# Patient Record
Sex: Female | Born: 1970 | Race: Black or African American | Hispanic: No | Smoking: Never smoker
Health system: Southern US, Community
[De-identification: ages and names within clinical notes are randomized; demographics above are authoritative.]

## PROBLEM LIST (undated history)

## (undated) DIAGNOSIS — K589 Irritable bowel syndrome without diarrhea: Secondary | ICD-10-CM

## (undated) DIAGNOSIS — J40 Bronchitis, not specified as acute or chronic: Secondary | ICD-10-CM

## (undated) DIAGNOSIS — A630 Anogenital (venereal) warts: Secondary | ICD-10-CM

## (undated) DIAGNOSIS — K219 Gastro-esophageal reflux disease without esophagitis: Secondary | ICD-10-CM

## (undated) DIAGNOSIS — Z9289 Personal history of other medical treatment: Secondary | ICD-10-CM

## (undated) DIAGNOSIS — F32A Depression, unspecified: Secondary | ICD-10-CM

## (undated) DIAGNOSIS — F329 Major depressive disorder, single episode, unspecified: Secondary | ICD-10-CM

## (undated) DIAGNOSIS — D3610 Benign neoplasm of peripheral nerves and autonomic nervous system, unspecified: Secondary | ICD-10-CM

## (undated) DIAGNOSIS — T7840XA Allergy, unspecified, initial encounter: Secondary | ICD-10-CM

## (undated) DIAGNOSIS — D649 Anemia, unspecified: Secondary | ICD-10-CM

## (undated) DIAGNOSIS — E785 Hyperlipidemia, unspecified: Secondary | ICD-10-CM

## (undated) DIAGNOSIS — K8020 Calculus of gallbladder without cholecystitis without obstruction: Secondary | ICD-10-CM

## (undated) DIAGNOSIS — F419 Anxiety disorder, unspecified: Secondary | ICD-10-CM

## (undated) DIAGNOSIS — L709 Acne, unspecified: Secondary | ICD-10-CM

## (undated) DIAGNOSIS — R7303 Prediabetes: Secondary | ICD-10-CM

## (undated) DIAGNOSIS — R7302 Impaired glucose tolerance (oral): Secondary | ICD-10-CM

## (undated) DIAGNOSIS — J45909 Unspecified asthma, uncomplicated: Secondary | ICD-10-CM

## (undated) DIAGNOSIS — Z8632 Personal history of gestational diabetes: Secondary | ICD-10-CM

## (undated) DIAGNOSIS — N200 Calculus of kidney: Secondary | ICD-10-CM

## (undated) DIAGNOSIS — G5762 Lesion of plantar nerve, left lower limb: Secondary | ICD-10-CM

## (undated) DIAGNOSIS — S92342A Displaced fracture of fourth metatarsal bone, left foot, initial encounter for closed fracture: Secondary | ICD-10-CM

## (undated) DIAGNOSIS — R87619 Unspecified abnormal cytological findings in specimens from cervix uteri: Secondary | ICD-10-CM

## (undated) HISTORY — DX: Unspecified abnormal cytological findings in specimens from cervix uteri: R87.619

## (undated) HISTORY — DX: Allergy, unspecified, initial encounter: T78.40XA

## (undated) HISTORY — PX: GYNECOLOGIC CRYOSURGERY: SHX857

## (undated) HISTORY — DX: Irritable bowel syndrome, unspecified: K58.9

## (undated) HISTORY — PX: OTHER SURGICAL HISTORY: SHX169

## (undated) HISTORY — DX: Personal history of other medical treatment: Z92.89

## (undated) HISTORY — DX: Lesion of plantar nerve, left lower limb: G57.62

## (undated) HISTORY — DX: Anemia, unspecified: D64.9

## (undated) HISTORY — DX: Calculus of kidney: N20.0

## (undated) HISTORY — DX: Benign neoplasm of peripheral nerves and autonomic nervous system, unspecified: D36.10

## (undated) HISTORY — DX: Prediabetes: R73.03

## (undated) HISTORY — DX: Personal history of gestational diabetes: Z86.32

## (undated) HISTORY — PX: TUBAL LIGATION: SHX77

## (undated) HISTORY — DX: Gastro-esophageal reflux disease without esophagitis: K21.9

## (undated) HISTORY — PX: ENDOMETRIAL ABLATION: SHX621

## (undated) HISTORY — DX: Displaced fracture of fourth metatarsal bone, left foot, initial encounter for closed fracture: S92.342A

## (undated) HISTORY — DX: Impaired glucose tolerance (oral): R73.02

## (undated) HISTORY — DX: Calculus of gallbladder without cholecystitis without obstruction: K80.20

---

## 1998-02-18 ENCOUNTER — Other Ambulatory Visit: Admission: RE | Admit: 1998-02-18 | Discharge: 1998-02-18 | Payer: Self-pay | Admitting: *Deleted

## 1998-08-15 ENCOUNTER — Inpatient Hospital Stay (HOSPITAL_COMMUNITY): Admission: AD | Admit: 1998-08-15 | Discharge: 1998-08-15 | Payer: Self-pay | Admitting: Obstetrics and Gynecology

## 1998-09-06 ENCOUNTER — Inpatient Hospital Stay (HOSPITAL_COMMUNITY): Admission: AD | Admit: 1998-09-06 | Discharge: 1998-09-09 | Payer: Self-pay | Admitting: *Deleted

## 1999-05-16 ENCOUNTER — Ambulatory Visit (HOSPITAL_COMMUNITY): Admission: RE | Admit: 1999-05-16 | Discharge: 1999-05-16 | Payer: Self-pay | Admitting: *Deleted

## 1999-05-20 ENCOUNTER — Ambulatory Visit (HOSPITAL_COMMUNITY): Admission: RE | Admit: 1999-05-20 | Discharge: 1999-05-20 | Payer: Self-pay | Admitting: *Deleted

## 1999-07-10 ENCOUNTER — Emergency Department (HOSPITAL_COMMUNITY): Admission: EM | Admit: 1999-07-10 | Discharge: 1999-07-10 | Payer: Self-pay | Admitting: Emergency Medicine

## 1999-07-11 ENCOUNTER — Encounter: Payer: Self-pay | Admitting: Emergency Medicine

## 2001-05-08 ENCOUNTER — Emergency Department (HOSPITAL_COMMUNITY): Admission: EM | Admit: 2001-05-08 | Discharge: 2001-05-08 | Payer: Self-pay

## 2001-11-12 ENCOUNTER — Ambulatory Visit (HOSPITAL_COMMUNITY): Admission: RE | Admit: 2001-11-12 | Discharge: 2001-11-12 | Payer: Self-pay | Admitting: Sports Medicine

## 2001-11-12 ENCOUNTER — Encounter: Payer: Self-pay | Admitting: Sports Medicine

## 2002-02-06 ENCOUNTER — Encounter: Admission: RE | Admit: 2002-02-06 | Discharge: 2002-04-11 | Payer: Self-pay | Admitting: Sports Medicine

## 2002-02-06 ENCOUNTER — Other Ambulatory Visit: Admission: RE | Admit: 2002-02-06 | Discharge: 2002-02-06 | Payer: Self-pay | Admitting: Internal Medicine

## 2003-03-15 ENCOUNTER — Emergency Department (HOSPITAL_COMMUNITY): Admission: AD | Admit: 2003-03-15 | Discharge: 2003-03-15 | Payer: Self-pay | Admitting: Family Medicine

## 2004-05-12 ENCOUNTER — Ambulatory Visit: Payer: Self-pay | Admitting: Family Medicine

## 2010-02-22 ENCOUNTER — Other Ambulatory Visit: Admission: RE | Admit: 2010-02-22 | Discharge: 2010-02-22 | Payer: Self-pay | Admitting: Family Medicine

## 2010-06-18 ENCOUNTER — Inpatient Hospital Stay (HOSPITAL_COMMUNITY): Payer: BC Managed Care – PPO

## 2010-06-18 ENCOUNTER — Inpatient Hospital Stay (HOSPITAL_COMMUNITY)
Admission: AD | Admit: 2010-06-18 | Discharge: 2010-06-20 | DRG: 361 | Disposition: A | Discharge status: Home / Self Care | Payer: BC Managed Care – PPO | Source: Ambulatory Visit | Attending: Obstetrics and Gynecology | Admitting: Obstetrics and Gynecology

## 2010-06-18 DIAGNOSIS — N9989 Other postprocedural complications and disorders of genitourinary system: Secondary | ICD-10-CM | POA: Diagnosis present

## 2010-06-18 DIAGNOSIS — K589 Irritable bowel syndrome without diarrhea: Secondary | ICD-10-CM | POA: Diagnosis present

## 2010-06-18 DIAGNOSIS — N719 Inflammatory disease of uterus, unspecified: Secondary | ICD-10-CM | POA: Diagnosis present

## 2010-06-18 DIAGNOSIS — N949 Unspecified condition associated with female genital organs and menstrual cycle: Secondary | ICD-10-CM | POA: Diagnosis present

## 2010-06-18 DIAGNOSIS — N390 Urinary tract infection, site not specified: Secondary | ICD-10-CM | POA: Diagnosis present

## 2010-06-18 DIAGNOSIS — R109 Unspecified abdominal pain: Secondary | ICD-10-CM | POA: Diagnosis present

## 2010-06-18 DIAGNOSIS — S3760XA Unspecified injury of uterus, initial encounter: Principal | ICD-10-CM | POA: Diagnosis present

## 2010-06-18 DIAGNOSIS — Y838 Other surgical procedures as the cause of abnormal reaction of the patient, or of later complication, without mention of misadventure at the time of the procedure: Secondary | ICD-10-CM | POA: Diagnosis present

## 2010-06-18 DIAGNOSIS — IMO0002 Reserved for concepts with insufficient information to code with codable children: Secondary | ICD-10-CM | POA: Diagnosis present

## 2010-06-18 LAB — CBC
HCT: 40.1 % (ref 36.0–46.0)
Hemoglobin: 12.7 g/dL (ref 12.0–15.0)
MCH: 26.4 pg (ref 26.0–34.0)
MCHC: 31.7 g/dL (ref 30.0–36.0)
MCV: 83.4 fL (ref 78.0–100.0)
Platelets: 335 10*3/uL (ref 150–400)
RBC: 4.81 MIL/uL (ref 3.87–5.11)
RDW: 14.3 % (ref 11.5–15.5)
WBC: 11.3 10*3/uL — ABNORMAL HIGH (ref 4.0–10.5)

## 2010-06-18 LAB — URINE MICROSCOPIC-ADD ON

## 2010-06-18 LAB — COMPREHENSIVE METABOLIC PANEL
ALT: 20 U/L (ref 0–35)
AST: 20 U/L (ref 0–37)
Albumin: 4.1 g/dL (ref 3.5–5.2)
Alkaline Phosphatase: 53 U/L (ref 39–117)
Chloride: 98 mEq/L (ref 96–112)
GFR calc Af Amer: >60 mL/min (ref >60)
Potassium: 3.7 mEq/L (ref 3.5–5.1)
Sodium: 137 mEq/L (ref 135–145)
Total Bilirubin: 0.7 mg/dL (ref 0.3–1.2)

## 2010-06-18 LAB — URINALYSIS, ROUTINE W REFLEX MICROSCOPIC
Bilirubin Urine: NEGATIVE
Glucose, UA: NEGATIVE mg/dL
Ketones, ur: NEGATIVE mg/dL
Protein, ur: NEGATIVE mg/dL

## 2010-06-18 MED ORDER — IOHEXOL 300 MG/ML  SOLN
100.0000 mL | Freq: Once | INTRAMUSCULAR | Status: AC | PRN
  Administered 2010-06-18: 100 mL via INTRAVENOUS

## 2010-06-19 ENCOUNTER — Other Ambulatory Visit: Payer: Self-pay | Admitting: Obstetrics and Gynecology

## 2010-06-19 LAB — URINE CULTURE
Colony Count: 30000
Culture  Setup Time: 201203171746

## 2010-06-19 LAB — COMPREHENSIVE METABOLIC PANEL
AST: 16 U/L (ref 0–37)
Albumin: 3.2 g/dL — ABNORMAL LOW (ref 3.5–5.2)
BUN: 6 mg/dL (ref 6–23)
Calcium: 8.7 mg/dL (ref 8.4–10.5)
Creatinine, Ser: 0.72 mg/dL (ref 0.4–1.2)
GFR calc Af Amer: >60 mL/min (ref >60)
GFR calc non Af Amer: >60 mL/min (ref >60)

## 2010-06-19 LAB — CBC
MCH: 25.8 pg — ABNORMAL LOW (ref 26.0–34.0)
MCHC: 31.3 g/dL (ref 30.0–36.0)
MCV: 82.6 fL (ref 78.0–100.0)
Platelets: 246 10*3/uL (ref 150–400)

## 2010-06-20 LAB — CBC
HCT: 34.9 % — ABNORMAL LOW (ref 36.0–46.0)
MCHC: 30.9 g/dL (ref 30.0–36.0)
MCV: 82.5 fL (ref 78.0–100.0)
Platelets: 330 10*3/uL (ref 150–400)
RDW: 14.4 % (ref 11.5–15.5)
WBC: 12.1 10*3/uL — ABNORMAL HIGH (ref 4.0–10.5)

## 2010-06-20 NOTE — Op Note (Signed)
NAMEMarland Kitchen  Alexis [name], Alexis [name]       ACCOUNT NO.:  [id number]  MEDICAL RECORD NO.:  [id number]           PATIENT TYPE:  I  LOCATION:  9317                          FACILITY:  [facility]  PHYSICIAN:  Angelia Mould. Derrell Lolling, M.D.DATE OF BIRTH:  10-28-1970  DATE OF PROCEDURE:  06/19/2010 DATE OF DISCHARGE:                              OPERATIVE REPORT   PREOPERATIVE DIAGNOSES: 1. Abdominal pain. 2. Status post recent endometrial ablation.  POSTOPERATIVE DIAGNOSES: 1. Uterine inflammation. 2. Acute small bowel adhesions, nonobstructive, nonperforated.  OPERATIONS PERFORMED: 1. Intraoperative general surgical consultation. 2. Lysis of adhesions.  SURGEON:  Angelia Mould. Derrell Lolling, MD  FIRST ASSISTANT:  Patsy Baltimore, MD  OPERATIVE INDICATIONS:  This is a 40 year old Afro American female who underwent outpatient endometrial ablation with some type of energy device about 9 or 10 days ago.  She has had some lower abdominal and right lower quadrant pain for about 5 days.  She has not had any fever, chills, nausea, or vomiting.  She has been a little bit constipated. She was admitted by Dr. Gevena Cotton.  CBC is normal.  CT scan shows thick walled, slightly inflamed uterus.  The small bowel and colon do not look diseased.  There is no abscess or free fluid or obstruction noted. Because of the recent procedure and onset of pain, it was felt wise to proceed with diagnostic laparoscopy as a diagnostic procedure to make sure that has not been a bowel injury.  In terms of the consultation, I did meet the patient in the holding area of the operating room.  I did take a history from her and examined her and found that she had mild tenderness throughout the lower abdomen perhaps more in the right lower quadrant than the left, but had hypoactive bowel sounds.  She did not seem to be particularly distended and did not have any dramatic peritoneal signs.  There was no abdominal mass.  INTRAOPERATIVE FINDINGS:  The  uterus was adherent to the anterior abdominal wall in the lower midline.  The uterus was somewhat thick- walled, but there was no exudate or signs of any abscess or inflammation.  The tubes and ovaries looked normal.  There was a loop of small bowel that was adherent to the posterior fundus of the uterus near the junction of the uterus and fallopian tube.  This was an acute adhesion and with teasing, I was able to just take this down.  On examining the small bowel, sigmoid colon, rectum, liver, right colon, I found no evidence of any primary inflammatory process.  There was no purulence or exudate and there was no evidence of any bowel injury or enteric content.  I felt that this was a negative diagnostic laparoscopy in terms of bowel injury.  There was also some omental adhesions in the lower midline presumably due to her previous tubal ligation.  These were chronic.  We were able to work around these just fine.  Again, the right fallopian tube and ovary looked normal.  The left fallopian tube and ovary looked normal.  No significant finding, was the adherence of the uterus to the lower midline, which appeared to be chronic.  OPERATIVE TECHNIQUE:  Following the induction of general endotracheal anesthesia, the patient's abdomen was prepped and draped in sterile fashion.  Surgical time-out was held identifying correct patient and correct procedure.  We placed an 11-mm trocar at the umbilicus. Insufflation was performed with visualization and findings as described above.  We placed a 5-mm trocar in the right midabdomen and a 5-mm trocar in the left midabdomen and used these to move the camera around and move the instruments around.  I took over the conduct of the case.  I found that the omental adhesions to the midline just below the umbilicus were very dense and did not involve any bowel, so we left this in place. I found one loop of small bowel adherent to the posterior wall of  the uterus, which appeared to be acute and with just gentle teasing, this came down without any evidence of any injury not even a serosal injury. I then ran the small bowel in the lower abdomen and looked at the sigmoid colon and rectum.  There was some clear fluid in the pelvis, which was evacuated.  At this point in the case, I felt there was no further indication for any general surgical procedure.  Dr. Gevena Cotton felt that she would probably simply close at this point.  Estimated blood loss at this point was about 10 mL.  Complications none. Sponge, needle and instrument counts were correct.     Angelia Mould. Derrell Lolling, M.D.     HMI/MEDQ  D:  06/19/2010  T:  06/20/2010  Job:  [id number]  Electronically Signed by Claud Kelp M.D. on 06/20/2010 09:39:12 AM

## 2010-06-24 LAB — STOOL CULTURE

## 2010-06-30 NOTE — Op Note (Signed)
NAMEMarland [name]  ANGELEAH, LABRAKE       ACCOUNT NO.:  [id number]  MEDICAL RECORD NO.:  [id number]           PATIENT TYPE:  I  LOCATION:  9317                          FACILITY:  [facility]  PHYSICIAN:  Patsy Baltimore, MD     DATE OF BIRTH:  10/26/1970  DATE OF PROCEDURE:  06/19/2010 DATE OF DISCHARGE:                              OPERATIVE REPORT   PREOPERATIVE DIAGNOSIS:  Suspected uterine perforation.  POSTOPERATIVE DIAGNOSES:  Adhesions and suspected uterine perforation.  PROCEDURES PERFORMED: 1. Diagnostic laparoscopy. 2. General surgery intraoperative consult with lysis of adhesion. 3. Hysteroscopy. 4. Dilation and curettage.  SURGEON:  Patsy Baltimore, MD  ASSISTANT:  Angelia Mould. Derrell Lolling, MD from general surgery.  ANESTHESIA:  General.  FINDINGS: 1. A loop of small bowel adherent to the posterior uterine fundus. 2. Chronic adhesions from previous patient's surgeries, omentum to the     anterior abdominal wall and the uterus to the anterior abdominal     wall.  SPECIMENS SENT:  Endometrial curettings.  ESTIMATED BLOOD LOSS:  Minimal.  IV FLUIDS GIVEN:  1800 mL.  URINE OUTPUT:  350 mL of clear yellow urine.  FLUID DEFICIT FOR HYSTEROSCOPY:  125 mL.  The patient was taken to the operating room with IV fluids running. Informed consent had been obtained for diagnostic laparoscopy.  She was put under general anesthesia.  Abdomen and perineum were prepped and draped in the usual sterile fashion.  She had already been receiving Zosyn and Flagyl and therefore there were no additional antibiotics given around the time of surgery.  She had sequential compression devices for DVT prophylaxis.  A time-out was called and we began the procedure.  An incision was made in the umbilicus enough to accommodate a 10-mm scope.  The trocar was then inserted without difficulty.  Entry into the intra-abdominal cavity was confirmed by low pressure upon initial insufflation of gas with  visualization of the intra-abdominal organs.  High-flow of CO2 was then initiated.  Inspection of the abdomen revealed omental adhesions to the anterior abdominal wall in the umbilical area and also in the area of the right lower quadrant.  Two additional 5-mm ports were placed on the right and left sides of the mid abdomen.  Inspection of the pelvis revealed an adhesion of loop of small bowel adherent to the posterior aspect of the uterine fundus.  The uterus was also adherent to the anterior/pelvic sidewall.  The piece of small bowel adherent to the uterus was easily dislodged.  However, it became obvious that the other adhesions were chronic.  The patient had a history of two previous cesarean sections plus an interval bilateral tubal ligation hence the chronic adhesions that were noted in her abdomen.  However, the loop of small bowel that was adherent to the uterus was deemed to be acute based on the way it was easily detached.  The loop of bowel was inspected by Dr. Derrell Lolling.  There was no evidence of ischemia.  It looked well vascularized and other areas of the bowel were inspected as well.  There was no evidence of bowel injury.  There was a little bit of fluid in the cul-de-sac.  This fluid  was suctioned.  It was clear with no evidence of infection.  The uterine serosa appeared normal.  At this point, the diagnostic laparoscopy was concluded.  The laparoscopic instruments were removed from the abdomen under direct visualization.  The 5-mm lateral incisions were closed with Dermabond.  The 10-mm incision was closed with a figure-of-eight suture putting the fascia together and the skin was closed with 4-0 Vicryl as well as Dermabond.  Attention was then turned to the vagina.  A speculum was inserted.  The anterior lip of the cervix was grasped with a single-toothed tenaculum.  The uterus was already dilated enough to accommodate the hysteroscope.  Hysteroscopy revealed a rather fluffy  endometrium.  It was difficult to visualize the cornua.  There were postop changes from the recent endometrial ablation.  However, there was no evidence of purulent material or infected tissue.  A blunt curette was used to scrape just a little bit of the endometrium and sent to Pathology.  Once this was done, all instruments were removed from the vagina.  The tenaculum puncture sites were hemostatic.  The patient was cleaned, reawakened and taken to the PACU in stable condition.  The sponge, needle and instrument counts were correct x2.          ______________________________ Patsy Baltimore, MD     CO/MEDQ  D:  06/19/2010  T:  06/20/2010  Job:  [id number]  Electronically Signed by Patsy Baltimore MD on 06/30/2010 12:40:22 PM

## 2010-07-15 ENCOUNTER — Emergency Department (HOSPITAL_COMMUNITY): Payer: Worker's Compensation

## 2010-07-15 ENCOUNTER — Emergency Department (HOSPITAL_COMMUNITY)
Admission: EM | Admit: 2010-07-15 | Discharge: 2010-07-15 | Disposition: A | Discharge status: Home / Self Care | Payer: Worker's Compensation | Attending: Emergency Medicine | Admitting: Emergency Medicine

## 2010-07-15 DIAGNOSIS — R109 Unspecified abdominal pain: Secondary | ICD-10-CM | POA: Insufficient documentation

## 2010-07-15 DIAGNOSIS — N949 Unspecified condition associated with female genital organs and menstrual cycle: Secondary | ICD-10-CM | POA: Insufficient documentation

## 2010-07-15 DIAGNOSIS — F3289 Other specified depressive episodes: Secondary | ICD-10-CM | POA: Insufficient documentation

## 2010-07-15 DIAGNOSIS — N390 Urinary tract infection, site not specified: Secondary | ICD-10-CM | POA: Insufficient documentation

## 2010-07-15 DIAGNOSIS — E7800 Pure hypercholesterolemia, unspecified: Secondary | ICD-10-CM | POA: Insufficient documentation

## 2010-07-15 DIAGNOSIS — F329 Major depressive disorder, single episode, unspecified: Secondary | ICD-10-CM | POA: Insufficient documentation

## 2010-07-15 DIAGNOSIS — N938 Other specified abnormal uterine and vaginal bleeding: Secondary | ICD-10-CM | POA: Insufficient documentation

## 2010-07-15 LAB — DIFFERENTIAL
Basophils Relative: 0 % (ref 0–1)
Lymphs Abs: 1.4 10*3/uL (ref 0.7–4.0)
Monocytes Absolute: 0.4 10*3/uL (ref 0.1–1.0)
Monocytes Relative: 7 % (ref 3–12)
Neutro Abs: 3.7 10*3/uL (ref 1.7–7.7)
Neutrophils Relative %: 66 % (ref 43–77)

## 2010-07-15 LAB — CBC
HCT: 37.2 % (ref 36.0–46.0)
Hemoglobin: 11.7 g/dL — ABNORMAL LOW (ref 12.0–15.0)
MCH: 25.9 pg — ABNORMAL LOW (ref 26.0–34.0)
MCHC: 31.5 g/dL (ref 30.0–36.0)
MCV: 82.5 fL (ref 78.0–100.0)
RBC: 4.51 MIL/uL (ref 3.87–5.11)

## 2010-07-15 LAB — BASIC METABOLIC PANEL
BUN: 5 mg/dL — ABNORMAL LOW (ref 6–23)
CO2: 30 mEq/L (ref 19–32)
Calcium: 9.3 mg/dL (ref 8.4–10.5)
Chloride: 104 mEq/L (ref 96–112)
Creatinine, Ser: 0.65 mg/dL (ref 0.4–1.2)
GFR calc Af Amer: >60 mL/min (ref >60)
Glucose, Bld: 92 mg/dL (ref 70–99)

## 2010-07-15 LAB — URINE MICROSCOPIC-ADD ON

## 2010-07-15 LAB — URINALYSIS, ROUTINE W REFLEX MICROSCOPIC
Glucose, UA: NEGATIVE mg/dL
Ketones, ur: NEGATIVE mg/dL
Protein, ur: NEGATIVE mg/dL
pH: 6 (ref 5.0–8.0)

## 2010-07-15 LAB — WET PREP, GENITAL: Trich, Wet Prep: NONE SEEN

## 2010-07-16 LAB — GC/CHLAMYDIA PROBE AMP, GENITAL: Chlamydia, DNA Probe: NEGATIVE

## 2010-07-17 ENCOUNTER — Emergency Department (HOSPITAL_COMMUNITY)
Admission: EM | Admit: 2010-07-17 | Discharge: 2010-07-17 | Disposition: A | Discharge status: Home / Self Care | Payer: Worker's Compensation | Attending: Emergency Medicine | Admitting: Emergency Medicine

## 2010-07-17 DIAGNOSIS — K589 Irritable bowel syndrome without diarrhea: Secondary | ICD-10-CM | POA: Insufficient documentation

## 2010-07-17 DIAGNOSIS — W010XXA Fall on same level from slipping, tripping and stumbling without subsequent striking against object, initial encounter: Secondary | ICD-10-CM | POA: Insufficient documentation

## 2010-07-17 DIAGNOSIS — F3289 Other specified depressive episodes: Secondary | ICD-10-CM | POA: Insufficient documentation

## 2010-07-17 DIAGNOSIS — M25569 Pain in unspecified knee: Secondary | ICD-10-CM | POA: Insufficient documentation

## 2010-07-17 DIAGNOSIS — Z79899 Other long term (current) drug therapy: Secondary | ICD-10-CM | POA: Insufficient documentation

## 2010-07-17 DIAGNOSIS — F329 Major depressive disorder, single episode, unspecified: Secondary | ICD-10-CM | POA: Insufficient documentation

## 2011-03-01 ENCOUNTER — Other Ambulatory Visit: Payer: Self-pay | Admitting: Family Medicine

## 2011-03-01 ENCOUNTER — Other Ambulatory Visit (HOSPITAL_COMMUNITY)
Admission: RE | Admit: 2011-03-01 | Discharge: 2011-03-01 | Disposition: A | Discharge status: Home / Self Care | Payer: BC Managed Care – PPO | Source: Ambulatory Visit | Attending: Family Medicine | Admitting: Family Medicine

## 2011-03-01 DIAGNOSIS — Z01419 Encounter for gynecological examination (general) (routine) without abnormal findings: Secondary | ICD-10-CM | POA: Insufficient documentation

## 2012-02-05 ENCOUNTER — Inpatient Hospital Stay (HOSPITAL_COMMUNITY)
Admission: AD | Admit: 2012-02-05 | Discharge: 2012-02-05 | Disposition: A | Discharge status: Hospice | Payer: Self-pay | Source: Ambulatory Visit | Attending: Obstetrics and Gynecology | Admitting: Obstetrics and Gynecology

## 2012-02-05 ENCOUNTER — Encounter (HOSPITAL_COMMUNITY): Payer: Self-pay

## 2012-02-05 ENCOUNTER — Inpatient Hospital Stay (HOSPITAL_COMMUNITY): Payer: Self-pay

## 2012-02-05 DIAGNOSIS — K5900 Constipation, unspecified: Secondary | ICD-10-CM

## 2012-02-05 DIAGNOSIS — R109 Unspecified abdominal pain: Secondary | ICD-10-CM | POA: Insufficient documentation

## 2012-02-05 DIAGNOSIS — R51 Headache: Secondary | ICD-10-CM | POA: Insufficient documentation

## 2012-02-05 HISTORY — DX: Depression, unspecified: F32.A

## 2012-02-05 HISTORY — DX: Bronchitis, not specified as acute or chronic: J40

## 2012-02-05 HISTORY — DX: Hyperlipidemia, unspecified: E78.5

## 2012-02-05 HISTORY — DX: Anogenital (venereal) warts: A63.0

## 2012-02-05 HISTORY — DX: Anxiety disorder, unspecified: F41.9

## 2012-02-05 HISTORY — DX: Major depressive disorder, single episode, unspecified: F32.9

## 2012-02-05 HISTORY — DX: Unspecified asthma, uncomplicated: J45.909

## 2012-02-05 HISTORY — DX: Acne, unspecified: L70.9

## 2012-02-05 LAB — URINE MICROSCOPIC-ADD ON

## 2012-02-05 LAB — CBC WITH DIFFERENTIAL/PLATELET
Hemoglobin: 12.1 g/dL (ref 12.0–15.0)
Lymphocytes Relative: 27 % (ref 12–46)
Lymphs Abs: 1.8 10*3/uL (ref 0.7–4.0)
Monocytes Relative: 7 % (ref 3–12)
Neutrophils Relative %: 65 % (ref 43–77)
Platelets: 259 10*3/uL (ref 150–400)
RBC: 4.4 MIL/uL (ref 3.87–5.11)
WBC: 6.6 10*3/uL (ref 4.0–10.5)

## 2012-02-05 LAB — URINALYSIS, ROUTINE W REFLEX MICROSCOPIC
Bilirubin Urine: NEGATIVE
Glucose, UA: NEGATIVE mg/dL
Ketones, ur: NEGATIVE mg/dL
Leukocytes, UA: NEGATIVE
Nitrite: NEGATIVE
Protein, ur: NEGATIVE mg/dL
Specific Gravity, Urine: 1.015 (ref 1.005–1.030)
Urobilinogen, UA: 0.2 mg/dL (ref 0.0–1.0)
pH: 6 (ref 5.0–8.0)

## 2012-02-05 LAB — POCT PREGNANCY, URINE: Preg Test, Ur: NEGATIVE

## 2012-02-05 LAB — WET PREP, GENITAL
WBC, Wet Prep HPF POC: NONE SEEN
Yeast Wet Prep HPF POC: NONE SEEN

## 2012-02-05 NOTE — MAU Provider Note (Signed)
History     CSN: [id number]  Arrival date and time: 02/05/12 1312   First Provider Initiated Contact with Patient 02/05/12 1338      Chief Complaint  Patient presents with  . Abdominal Pain   HPI Alexis [name] is 41 y.o. presents with left sided pain that began around 7am.  Patient of [facility].  Describes as constant when she moves, and resolves when lying quietly.  Nausea without vomiting.  Had a headache yesterday, went away and came back today.  Hasn't taken anything for the headache.  Last bowel movement this am--had 3 bowel movements, which were described as hard,  this am--took MOM last night for constipation.  Denies fever, vaginal bleeding or discharge.  LMP 02-01-12--normal cycle for her.  Condoms for contraception.  Had endometrial ablation 3/13 then 1 week later had diagnostic lap "they found adhesions and separated by uterus/bowel.  Hx of GERD.     No past medical history on file.  No past surgical history on file.  No family history on file.  History  Substance Use Topics  . Smoking status: Not on file  . Smokeless tobacco: Not on file  . Alcohol Use: Not on file    Allergies: Allergies not on file  No prescriptions prior to admission    Review of Systems  Constitutional: Negative for fever and chills.  Respiratory: Negative.   Cardiovascular: Negative.   Gastrointestinal: Positive for heartburn (hx of ), nausea, abdominal pain (left sided) and constipation. Negative for vomiting and diarrhea.  Genitourinary: Negative.   Neurological: Positive for headaches.   Physical Exam   Blood pressure 126/74, temperature 98.1 F (36.7 C), temperature source Oral, resp. rate 16, height 5\' 3"  (1.6 m), weight 70.308 kg (155 lb), last menstrual period 02/01/2012, SpO2 100.00%.  Physical Exam  Constitutional: She is oriented to person, place, and time. She appears well-developed and well-nourished. No distress.  HENT:  Head: Normocephalic.  Neck: Normal  range of motion.  Cardiovascular: Normal rate.   Respiratory: Effort normal.  GI: Soft. She exhibits no distension and no mass. There is tenderness (left lower quadrant). There is no rebound and no guarding.  Genitourinary: There is no tenderness or lesion on the right labia. There is no tenderness or lesion on the left labia. Uterus is not enlarged and not tender. Cervix exhibits no motion tenderness, no discharge and no friability. Right adnexum displays no mass, no tenderness and no fullness. Left adnexum displays tenderness. Left adnexum displays no mass and no fullness. No bleeding around the vagina. No vaginal discharge found.  Neurological: She is alert and oriented to person, place, and time.  Skin: Skin is warm and dry.  Psychiatric: She has a normal mood and affect. Her behavior is normal.   Results for orders placed during the hospital encounter of 02/05/12 (from the past 24 hour(s))  URINALYSIS, ROUTINE W REFLEX MICROSCOPIC     Status: Abnormal   Collection Time   02/05/12  1:25 PM      Component Value Range   Color, Urine YELLOW  YELLOW   APPearance CLEAR  CLEAR   Specific Gravity, Urine 1.015  1.005 - 1.030   pH 6.0  5.0 - 8.0   Glucose, UA NEGATIVE  NEGATIVE mg/dL   Hgb urine dipstick MODERATE (*) NEGATIVE   Bilirubin Urine NEGATIVE  NEGATIVE   Ketones, ur NEGATIVE  NEGATIVE mg/dL   Protein, ur NEGATIVE  NEGATIVE mg/dL   Urobilinogen, UA 0.2  0.0 - 1.0  mg/dL   Nitrite NEGATIVE  NEGATIVE   Leukocytes, UA NEGATIVE  NEGATIVE  URINE MICROSCOPIC-ADD ON     Status: Abnormal   Collection Time   02/05/12  1:25 PM      Component Value Range   Squamous Epithelial / LPF FEW (*) RARE   RBC / HPF 3-6  <3 RBC/hpf  POCT PREGNANCY, URINE     Status: Normal   Collection Time   02/05/12  1:37 PM      Component Value Range   Preg Test, Ur NEGATIVE  NEGATIVE  CBC WITH DIFFERENTIAL     Status: Normal   Collection Time   02/05/12  2:05 PM      Component Value Range   WBC 6.6  4.0 - 10.5  K/uL   RBC 4.40  3.87 - 5.11 MIL/uL   Hemoglobin 12.1  12.0 - 15.0 g/dL   HCT 96.0  45.4 - 09.8 %   MCV 83.6  78.0 - 100.0 fL   MCH 27.5  26.0 - 34.0 pg   MCHC 32.9  30.0 - 36.0 g/dL   RDW 11.9  14.7 - 82.9 %   Platelets 259  150 - 400 K/uL   Neutrophils Relative 65  43 - 77 %   Neutro Abs 4.3  1.7 - 7.7 K/uL   Lymphocytes Relative 27  12 - 46 %   Lymphs Abs 1.8  0.7 - 4.0 K/uL   Monocytes Relative 7  3 - 12 %   Monocytes Absolute 0.4  0.1 - 1.0 K/uL   Eosinophils Relative 1  0 - 5 %   Eosinophils Absolute 0.1  0.0 - 0.7 K/uL   Basophils Relative 0  0 - 1 %   Basophils Absolute 0.0  0.0 - 0.1 K/uL  WET PREP, GENITAL     Status: Normal   Collection Time   02/05/12  2:15 PM      Component Value Range   Yeast Wet Prep HPF POC NONE SEEN  NONE SEEN   Trich, Wet Prep NONE SEEN  NONE SEEN   Clue Cells Wet Prep HPF POC NONE SEEN  NONE SEEN   WBC, Wet Prep HPF POC NONE SEEN  NONE SEEN   Clinical Data: Left lower quadrant pelvic pain. Prior tubal  ligation.  TRANSABDOMINAL AND TRANSVAGINAL ULTRASOUND OF PELVIS  Technique: Both transabdominal and transvaginal ultrasound  examinations of the pelvis were performed. Transabdominal  technique was performed for global imaging of the pelvis including  uterus, ovaries, adnexal regions, and pelvic cul-de-sac.  It was necessary to proceed with endovaginal exam following the  transabdominal exam to visualize the endometrium and ovaries.  Comparison: CT 06/18/2010  Findings:  Uterus: 7.0 x 4.7 x 3.7 cm. Normal. Anteverted, anteflexed.  Endometrium: 8 mm. Uniformly thin and echogenic without focal  abnormality.  Right ovary: 2.9 x 1.8 x 1.5 cm. Normal.  Left ovary: 2.7 x 2.1 x 1.6 cm. Normal.  Other Findings: No free fluid  IMPRESSION:  Normal study. No evidence of pelvic mass or other significant  abnormality.  Original Report Authenticated By: Christiana Pellant, M.D.     MAU Course  Procedures  MDM Reported MSE to Dr. Richardson Dopp at 13:50   Order given to evaluate pain--CBC with Diff, Pelvic ultrasound and physical exam.   Reported labs, ultrasound and physical exam findings to Dr. Richardson Dopp.  With no GYN findings, Dr. Richardson Dopp would like for her to follow up with her PCP, who is Dr. Wynelle Link per patient report.  Assessment and Plan  A: Left sided abdominal pain     Neg labs and ultrasound findings    Hx of constipation  P: Discussed lab and ultrasound findings with the patient.  Instructed her to followup with her PCP per Dr. Richardson Dopp     Increase fiber, fruits and veges and increased water to prevent constipation   KEY,EVE M 02/05/2012, 1:38 PM

## 2012-02-05 NOTE — MAU Note (Signed)
Patient states she had sudden onset of left side pain a little above waist level. States she has had some nausea, no vomiting. Worse with movement. Patient states she had a BTL in 2000. Denies bleeding or vaginal discharge,. Has a slight headache.

## 2012-04-25 ENCOUNTER — Other Ambulatory Visit: Payer: Self-pay | Admitting: Nurse Practitioner

## 2012-04-25 ENCOUNTER — Other Ambulatory Visit (HOSPITAL_COMMUNITY)
Admission: RE | Admit: 2012-04-25 | Discharge: 2012-04-25 | Disposition: A | Discharge status: Home / Self Care | Payer: BC Managed Care – PPO | Source: Ambulatory Visit | Attending: Obstetrics and Gynecology | Admitting: Obstetrics and Gynecology

## 2012-04-25 DIAGNOSIS — Z01419 Encounter for gynecological examination (general) (routine) without abnormal findings: Secondary | ICD-10-CM | POA: Insufficient documentation

## 2012-04-25 DIAGNOSIS — Z1151 Encounter for screening for human papillomavirus (HPV): Secondary | ICD-10-CM | POA: Insufficient documentation

## 2012-04-25 DIAGNOSIS — R87810 Cervical high risk human papillomavirus (HPV) DNA test positive: Secondary | ICD-10-CM | POA: Insufficient documentation

## 2012-09-25 ENCOUNTER — Ambulatory Visit (INDEPENDENT_AMBULATORY_CARE_PROVIDER_SITE_OTHER): Payer: PRIVATE HEALTH INSURANCE | Admitting: Internal Medicine

## 2012-09-25 ENCOUNTER — Encounter: Payer: Self-pay | Admitting: Internal Medicine

## 2012-09-25 VITALS — BP 110/66 | HR 88 | Temp 98.1°F | Resp 18 | Ht 61.75 in | Wt 159.0 lb

## 2012-09-25 DIAGNOSIS — N200 Calculus of kidney: Secondary | ICD-10-CM

## 2012-09-25 DIAGNOSIS — K219 Gastro-esophageal reflux disease without esophagitis: Secondary | ICD-10-CM

## 2012-09-25 DIAGNOSIS — F32A Depression, unspecified: Secondary | ICD-10-CM

## 2012-09-25 DIAGNOSIS — F329 Major depressive disorder, single episode, unspecified: Secondary | ICD-10-CM

## 2012-09-25 DIAGNOSIS — E7800 Pure hypercholesterolemia, unspecified: Secondary | ICD-10-CM

## 2012-09-25 DIAGNOSIS — Z0000 Encounter for general adult medical examination without abnormal findings: Secondary | ICD-10-CM

## 2012-09-25 HISTORY — DX: Calculus of kidney: N20.0

## 2012-09-25 HISTORY — DX: Gastro-esophageal reflux disease without esophagitis: K21.9

## 2012-09-25 LAB — LDL CHOLESTEROL, DIRECT: Direct LDL: 153.8 mg/dL

## 2012-09-25 LAB — LIPID PANEL
Cholesterol: 210 mg/dL — ABNORMAL HIGH (ref 0–200)
HDL: 39.7 mg/dL (ref >39.00)
Triglycerides: 68 mg/dL (ref 0.0–149.0)

## 2012-09-25 MED ORDER — SIMVASTATIN 40 MG PO TABS: 40.0000 mg | ORAL_TABLET | Freq: Every day | ORAL | Status: DC

## 2012-09-25 MED ORDER — SERTRALINE HCL 100 MG PO TABS: 200.0000 mg | ORAL_TABLET | Freq: Every day | ORAL | Status: DC

## 2012-09-25 NOTE — Progress Notes (Signed)
  Subjective:    Patient ID: Alexis [name], female    DOB: 01-17-1971, 42 y.o.   MRN: [id number]  HPI 42 year old patient who is seen today to establish with our practice. She has a history of hypercholesterolemia and has been treated for approximately 5 years. She has been on sertraline for anxiety depression for approximately 10 years. Additional medical problems include mild gastroesophageal reflux disease as well as chronic constipation issues. She passed a kidney stone in February of this year.  Past medical history is also pertinent for a history of laparoscopy for abdominal pain in 2012. 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 She has been a 15 year resident of [address] and went to school at [employer]. and [employer] regular exercise 2 sons age 39 and 75  Family history father age 31 with hypertension hypercholesterolemia and BPH. Mother is 54 with hypertension. 2 brothers one with schizophrenia. 2 sisters are well. No family history of cancer    Review of Systems  Constitutional: Negative for fever, appetite change, fatigue and unexpected weight change.  HENT: Negative for hearing loss, ear pain, nosebleeds, congestion, sore throat, mouth sores, trouble swallowing, neck stiffness, dental problem, voice change, sinus pressure and tinnitus.   Eyes: Negative for photophobia, pain, redness and visual disturbance.  Respiratory: Negative for cough, chest tightness and shortness of breath.   Cardiovascular: Negative for chest pain, palpitations and leg swelling.  Gastrointestinal: Positive for constipation. Negative for nausea, vomiting, abdominal pain, diarrhea, blood in stool, abdominal distention and rectal pain.  Genitourinary: Negative for dysuria, urgency, frequency, hematuria, flank pain, vaginal bleeding, vaginal discharge, difficulty urinating, genital sores, vaginal  pain, menstrual problem and pelvic pain.  Musculoskeletal: Negative for back pain and arthralgias.  Skin: Negative for rash.       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  Hematological: Negative for adenopathy. Does not bruise/bleed easily.  Psychiatric/Behavioral: Negative for suicidal ideas, behavioral problems, self-injury, dysphoric mood and agitation. The patient is not nervous/anxious.        Objective:   Physical Exam  Constitutional: She is oriented to person, place, and time. She appears well-developed and well-nourished.  Blood pressure low normal  HENT:  Head: Normocephalic.  Right Ear: External ear normal.  Left Ear: External ear normal.  Mouth/Throat: Oropharynx is clear and moist.  Eyes: Conjunctivae and EOM are normal. Pupils are equal, round, and reactive to light.  Neck: Normal range of motion. Neck supple. No thyromegaly present.  Cardiovascular: Normal rate, regular rhythm, normal heart sounds and intact distal pulses.   Pulmonary/Chest: Effort normal and breath sounds normal.  Abdominal: Soft. Bowel sounds are normal. She exhibits no mass. There is no tenderness.  Musculoskeletal: Normal range of motion.  Lymphadenopathy:    She has no cervical adenopathy.  Neurological: She is alert and oriented to person, place, and time.  Skin: Skin is warm and dry. No rash noted.  Psychiatric: She has a normal mood and affect. Her behavior is normal.          Assessment & Plan:    Preventive health exam Hypercholesterolemia Chronic constipation Anxiety depression. Stable GERD. Anti-reflex regimen discussed   Recheck 6 months with a lipid profile at that time

## 2012-09-25 NOTE — Patient Instructions (Addendum)
It is important that you exercise regularly, at least 20 minutes 3 to 4 times per week.  If you develop chest pain or shortness of breath seek  medical attention.  Avoids foods high in acid such as tomatoes citrus juices, and spicy foods.  Avoid eating within two hours of lying down or before exercising.  Do not overheat.  Try smaller more frequent meals.  If symptoms persist, elevate the head of her bed 12 inches while sleeping.  Diet for Gastroesophageal Reflux Disease, Adult Reflux (acid reflux) is when acid from your stomach flows up into the esophagus. When acid comes in contact with the esophagus, the acid causes irritation and soreness (inflammation) in the esophagus. When reflux happens often or so severely that it causes damage to the esophagus, it is called gastroesophageal reflux disease (GERD). Nutrition therapy can help ease the discomfort of GERD. FOODS OR DRINKS TO AVOID OR LIMIT  Smoking or chewing tobacco. Nicotine is one of the most potent stimulants to acid production in the gastrointestinal tract.  Caffeinated and decaffeinated coffee and black tea.  Regular or low-calorie carbonated beverages or energy drinks (caffeine-free carbonated beverages are allowed).   Strong spices, such as black pepper, white pepper, red pepper, cayenne, curry powder, and chili powder.  Peppermint or spearmint.  Chocolate.  High-fat foods, including meats and fried foods. Extra added fats including oils, butter, salad dressings, and nuts. Limit these to less than 8 tsp per day.  Fruits and vegetables if they are not tolerated, such as citrus fruits or tomatoes.  Alcohol.  Any food that seems to aggravate your condition. If you have questions regarding your diet, call your caregiver or a registered dietitian. OTHER THINGS THAT MAY HELP GERD INCLUDE:   Eating your meals slowly, in a relaxed setting.  Eating 5 to 6 small meals per day instead of 3 large meals.  Eliminating food for a  period of time if it causes distress.  Not lying down until 3 hours after eating a meal.  Keeping the head of your bed raised 6 to 9 inches (15 to 23 cm) by using a foam wedge or blocks under the legs of the bed. Lying flat may make symptoms worse.  Being physically active. Weight loss may be helpful in reducing reflux in overweight or obese adults.  Wear loose fitting clothing EXAMPLE MEAL PLAN This meal plan is approximately 2,000 calories based on https://www.bernard.org/ meal planning guidelines. Breakfast   cup cooked oatmeal.  1 cup strawberries.  1 cup low-fat milk.  1 oz almonds. Snack  1 cup cucumber slices.  6 oz yogurt (made from low-fat or fat-free milk). Lunch  2 slice whole-wheat bread.  2 oz sliced [place].  2 tsp mayonnaise.  1 cup blueberries.  1 cup snap peas. Snack  6 whole-wheat crackers.  1 oz string cheese. Dinner   cup brown rice.  1 cup mixed veggies.  1 tsp olive oil.  3 oz grilled fish. Document Released: 03/20/2005 Document Revised: 06/12/2011 Document Reviewed: 02/03/2011 [facility] Patient Information 2014 [place], [place]. Gastroesophageal Reflux Disease, Adult Gastroesophageal reflux disease (GERD) happens when acid from your stomach flows up into the esophagus. When acid comes in contact with the esophagus, the acid causes soreness (inflammation) in the esophagus. Over time, GERD may create small holes (ulcers) in the lining of the esophagus. CAUSES   Increased body weight. This puts pressure on the stomach, making acid rise from the stomach into the esophagus.  Smoking. This increases acid production in  the stomach.  Drinking alcohol. This causes decreased pressure in the lower esophageal sphincter (valve or ring of muscle between the esophagus and stomach), allowing acid from the stomach into the esophagus.  Late evening meals and a full stomach. This increases pressure and acid production in the stomach.  A malformed lower  esophageal sphincter. Sometimes, no cause is found. SYMPTOMS   Burning pain in the lower part of the mid-chest behind the breastbone and in the mid-stomach area. This may occur twice a week or more often.  Trouble swallowing.  Sore throat.  Dry cough.  Asthma-like symptoms including chest tightness, shortness of breath, or wheezing. DIAGNOSIS  Your caregiver may be able to diagnose GERD based on your symptoms. In some cases, X-rays and other tests may be done to check for complications or to check the condition of your stomach and esophagus. TREATMENT  Your caregiver may recommend over-the-counter or prescription medicines to help decrease acid production. Ask your caregiver before starting or adding any new medicines.  HOME CARE INSTRUCTIONS   Change the factors that you can control. Ask your caregiver for guidance concerning weight loss, quitting smoking, and alcohol consumption.  Avoid foods and drinks that make your symptoms worse, such as:  Caffeine or alcoholic drinks.  Chocolate.  Peppermint or mint flavorings.  Garlic and onions.  Spicy foods.  Citrus fruits, such as oranges, lemons, or limes.  Tomato-based foods such as sauce, chili, salsa, and pizza.  Fried and fatty foods.  Avoid lying down for the 3 hours prior to your bedtime or prior to taking a nap.  Eat small, frequent meals instead of large meals.  Wear loose-fitting clothing. Do not wear anything tight around your waist that causes pressure on your stomach.  Raise the head of your bed 6 to 8 inches with wood blocks to help you sleep. Extra pillows will not help.  Only take over-the-counter or prescription medicines for pain, discomfort, or fever as directed by your caregiver.  Do not take aspirin, ibuprofen, or other nonsteroidal anti-inflammatory drugs (NSAIDs). SEEK IMMEDIATE MEDICAL CARE IF:   You have pain in your arms, neck, jaw, teeth, or back.  Your pain increases or changes in intensity  or duration.  You develop nausea, vomiting, or sweating (diaphoresis).  You develop shortness of breath, or you faint.  Your vomit is green, yellow, black, or looks like coffee grounds or blood.  Your stool is red, bloody, or black. These symptoms could be signs of other problems, such as heart disease, gastric bleeding, or esophageal bleeding. MAKE SURE YOU:   Understand these instructions.  Will watch your condition.  Will get help right away if you are not doing well or get worse. Document Released: 12/28/2004 Document Revised: 06/12/2011 Document Reviewed: 10/07/2010 [facility] Patient Information 2014 [place], [place]. Constipation, Adult Constipation is when a person has fewer than 3 bowel movements a week; has difficulty having a bowel movement; or has stools that are dry, hard, or larger than normal. As people grow older, constipation is more common. If you try to fix constipation with medicines that make you have a bowel movement (laxatives), the problem may get worse. Long-term laxative use may cause the muscles of the colon to become weak. A low-fiber diet, not taking in enough fluids, and taking certain medicines may make constipation worse. CAUSES   Certain medicines, such as antidepressants, pain medicine, iron supplements, antacids, and water pills.   Certain diseases, such as diabetes, irritable bowel syndrome (IBS), thyroid disease, or depression.  Not drinking enough water.   Not eating enough fiber-rich foods.   Stress or travel.  Lack of physical activity or exercise.  Not going to the restroom when there is the urge to have a bowel movement.  Ignoring the urge to have a bowel movement.  Using laxatives too much. SYMPTOMS   Having fewer than 3 bowel movements a week.   Straining to have a bowel movement.   Having hard, dry, or larger than normal stools.   Feeling full or bloated.   Pain in the lower abdomen.  Not feeling relief after  having a bowel movement. DIAGNOSIS  Your caregiver will take a medical history and perform a physical exam. Further testing may be done for severe constipation. Some tests may include:   A barium enema X-ray to examine your rectum, colon, and sometimes, your small intestine.  A sigmoidoscopy to examine your lower colon.  A colonoscopy to examine your entire colon. TREATMENT  Treatment will depend on the severity of your constipation and what is causing it. Some dietary treatments include drinking more fluids and eating more fiber-rich foods. Lifestyle treatments may include regular exercise. If these diet and lifestyle recommendations do not help, your caregiver may recommend taking over-the-counter laxative medicines to help you have bowel movements. Prescription medicines may be prescribed if over-the-counter medicines do not work.  HOME CARE INSTRUCTIONS   Increase dietary fiber in your diet, such as fruits, vegetables, whole grains, and beans. Limit high-fat and processed sugars in your diet, such as [place] fries, hamburgers, cookies, candies, and soda.   A fiber supplement may be added to your diet if you cannot get enough fiber from foods.   Drink enough fluids to keep your urine clear or pale yellow.   Exercise regularly or as directed by your caregiver.   Go to the restroom when you have the urge to go. Do not hold it.  Only take medicines as directed by your caregiver. Do not take other medicines for constipation without talking to your caregiver first. SEEK IMMEDIATE MEDICAL CARE IF:   You have bright red blood in your stool.   Your constipation lasts for more than 4 days or gets worse.   You have abdominal or rectal pain.   You have thin, pencil-like stools.  You have unexplained weight loss. MAKE SURE YOU:   Understand these instructions.  Will watch your condition.  Will get help right away if you are not doing well or get worse. Document Released:  12/17/2003 Document Revised: 06/12/2011 Document Reviewed: 02/21/2011 [facility] Patient Information 2014 [place], [place].

## 2012-10-25 IMAGING — CT CT ABD-PELV W/ CM
1 of 2 series · 15 of 32 positions shown, 19 images · IV contrast (OMNIPAQUE)
Comparison: None.

CLINICAL DATA: Abdominal pain, history of endometrial ablation 10
days ago, some right-sided pain

CT ABDOMEN AND PELVIS WITH CONTRAST
TECHNIQUE: Multidetector CT imaging of the abdomen and pelvis was
performed following the standard protocol during bolus
administration of intravenous contrast.
Contrast: 100 ml Xmnipaque-NLL

[Series 2: routine abdomen/pelvis with · axial · 0.64mm/px · z∈[+669,+1069]mm · 15 of 88 slices shown, 19 images]
[im 4/88  soft-tissue]
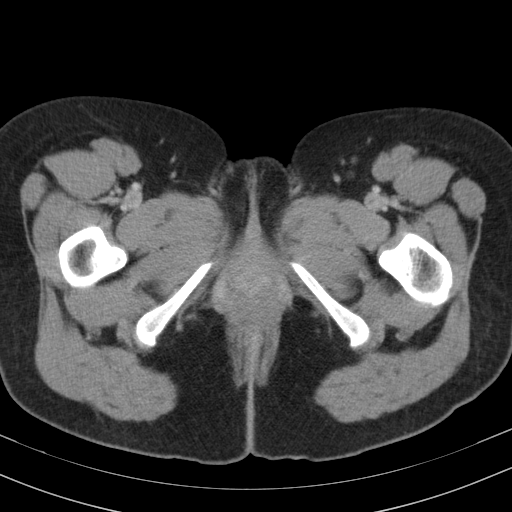
[im 4/88  bone]
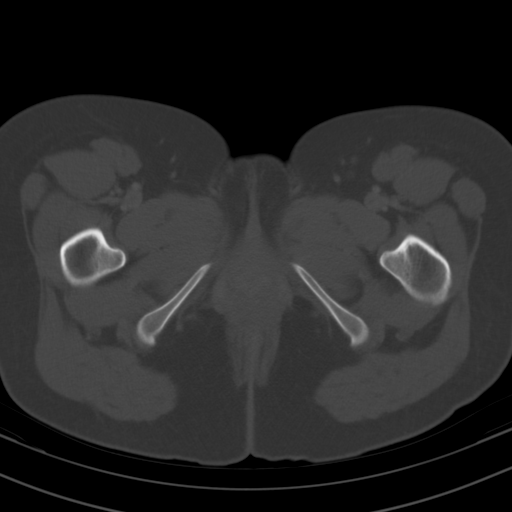
[im 11/88  soft-tissue]
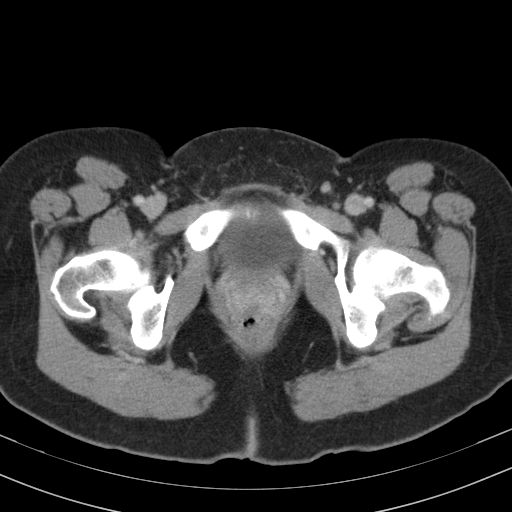
[im 19/88  soft-tissue]
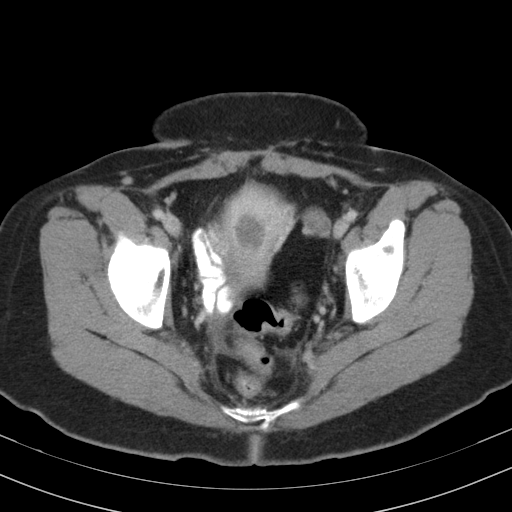
[im 26/88  soft-tissue]
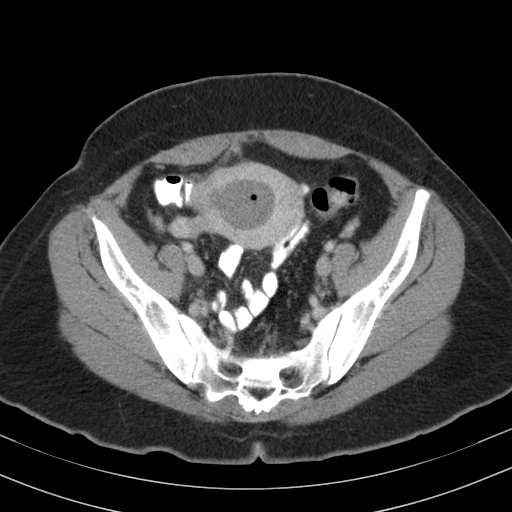
[im 30/88  soft-tissue]
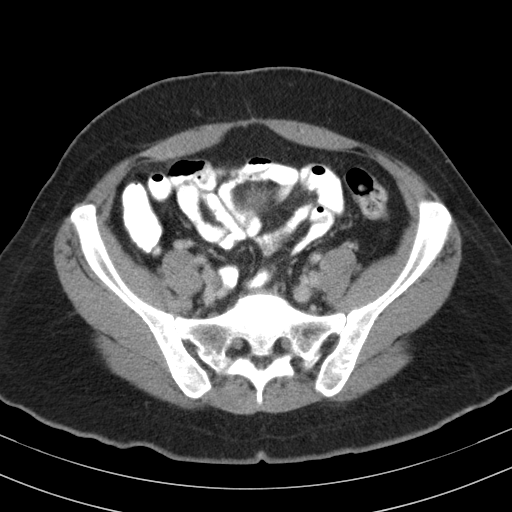
[im 37/88  soft-tissue]
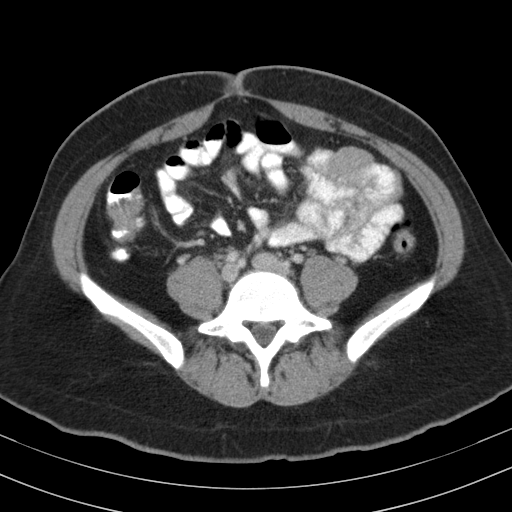
[im 44/88  soft-tissue]
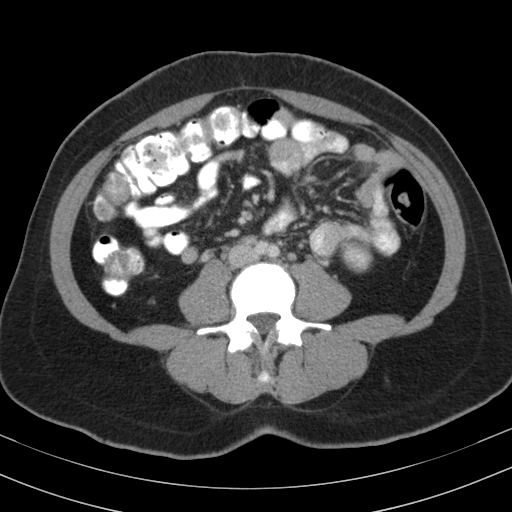
[im 51/88  soft-tissue]
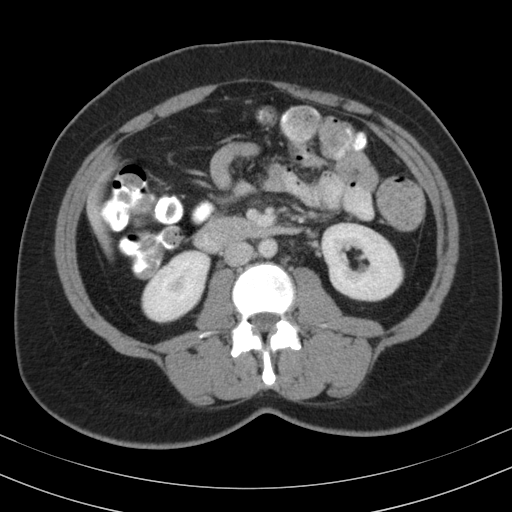
[im 59/88  soft-tissue]
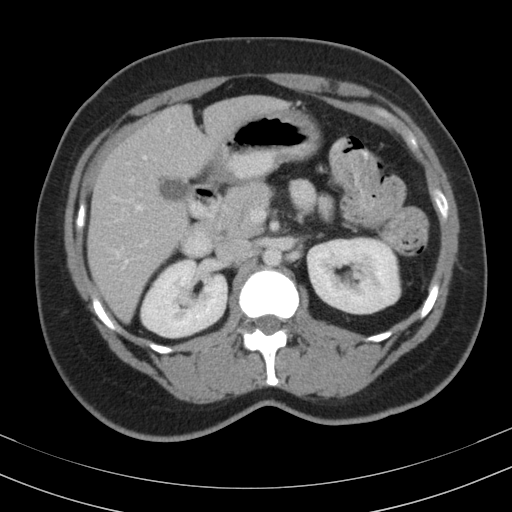
[im 59/88  bone]
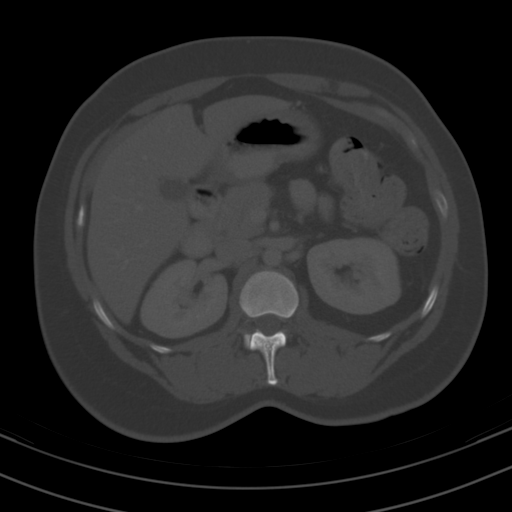
[im 62/88  soft-tissue]
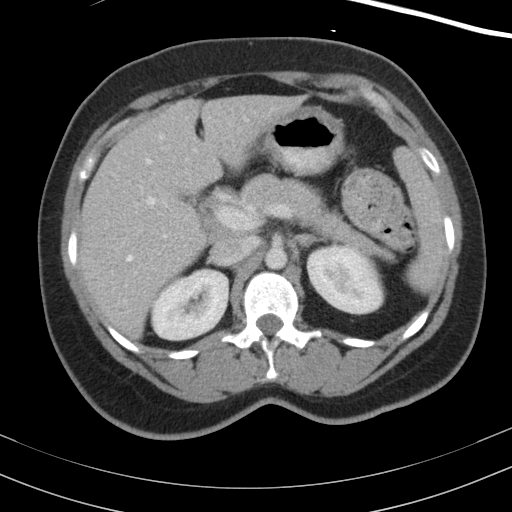
[im 69/88  soft-tissue]
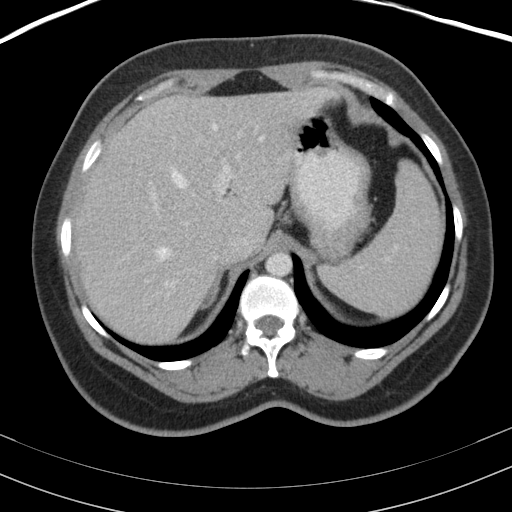
[im 73/88  lung]
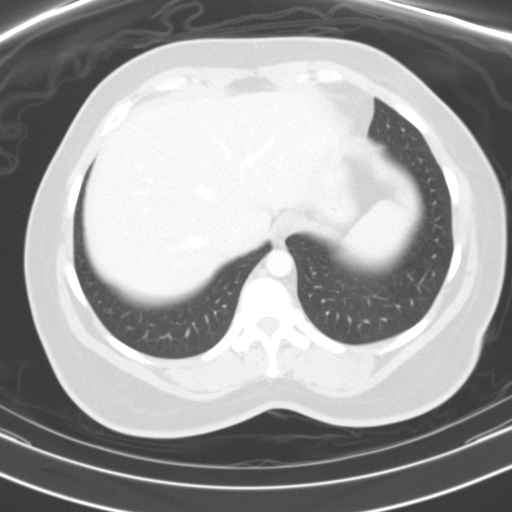
[im 77/88  soft-tissue]
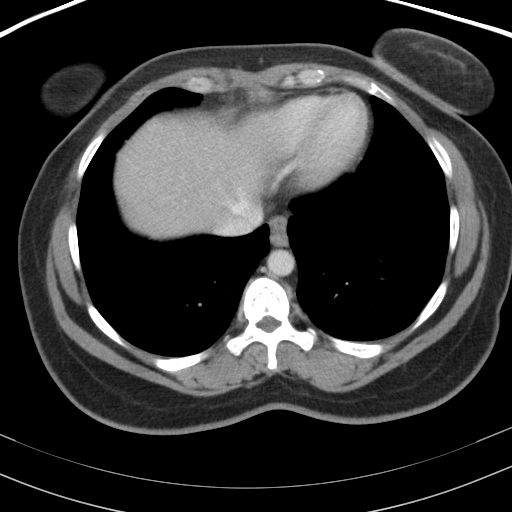
[im 77/88  lung]
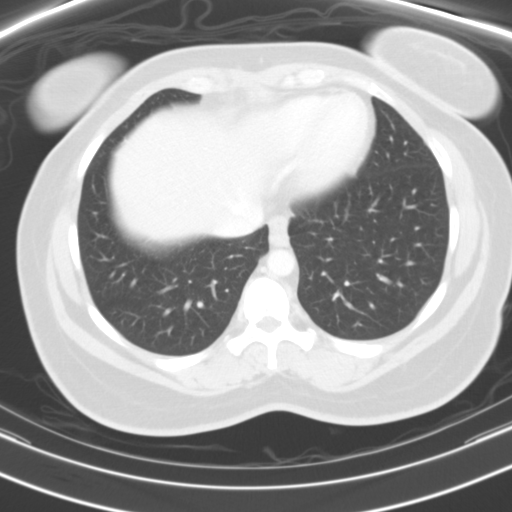
[im 80/88  lung]
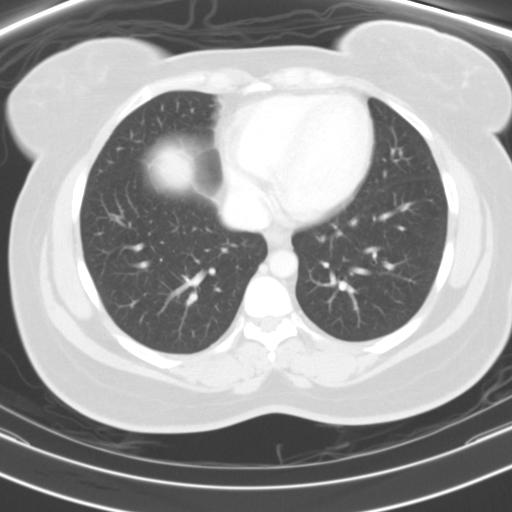
[im 84/88  soft-tissue]
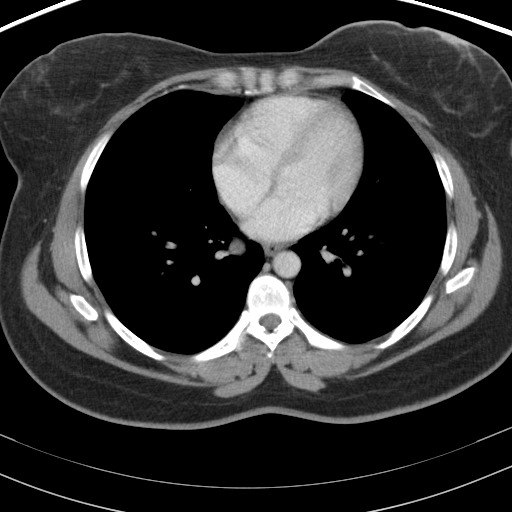
[im 84/88  lung]
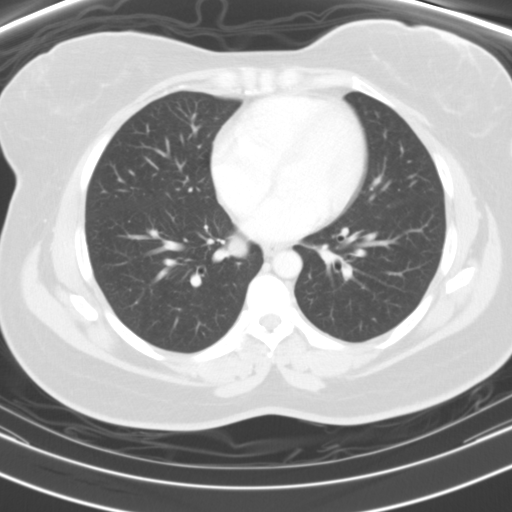

[15 of 32 positions shown; findings below may reference images not displayed]

FINDINGS: The lung bases are clear.  The liver enhances with no
focal abnormality and no ductal dilatation is seen.  No calcified
gallstones are noted.  The pancreas is normal in size and the
pancreatic duct is not dilated.  The adrenal glands and spleen are
unremarkable.  The stomach is not well distended but is
unremarkable.  The kidneys enhance with no calculus or mass and no
hydronephrosis is seen.  The abdominal aorta is normal in caliber.
No adenopathy is seen.

There is a rounded area of low attenuation centrally within the
uterus with a small amount of air present.  This area measures 38 x
33 mm with an attenuation of 45 HU.  This could represent blood or
possibly infection.  In addition there is a linear lucency within
the myometrium of the right aspect of the uterus near the fundus.
This could conceivably represent an area of perforation and there
is an extrauterine amorphous collection immediately adjacent which
could represent hematoma or abscess.  The left ovary is
unremarkable. A small amount of free fluid is noted within the
pelvis.  The appendix and terminal ileum are unremarkable.  No
acute bony abnormality is seen.
IMPRESSION: 1.  Rounded low attenuation within the uterine cavity may represent
post endometrial ablation hematoma or possibly abscess with a small
amount of air present.
2.  There is a linear area of low attenuation within the myometrium
of the right uterus in the fundus worrisome for possible focus of
perforation with possible adjacent hematoma or abscess.  Small
amount of free fluid in the pelvis.

## 2013-02-06 ENCOUNTER — Other Ambulatory Visit: Payer: Self-pay

## 2013-02-18 ENCOUNTER — Encounter: Payer: Self-pay | Admitting: Internal Medicine

## 2013-02-18 ENCOUNTER — Ambulatory Visit (INDEPENDENT_AMBULATORY_CARE_PROVIDER_SITE_OTHER): Payer: PRIVATE HEALTH INSURANCE | Admitting: Internal Medicine

## 2013-02-18 VITALS — BP 120/80 | HR 85 | Temp 98.2°F | Resp 20 | Wt 158.0 lb

## 2013-02-18 DIAGNOSIS — Z23 Encounter for immunization: Secondary | ICD-10-CM

## 2013-02-18 DIAGNOSIS — J069 Acute upper respiratory infection, unspecified: Secondary | ICD-10-CM

## 2013-02-18 DIAGNOSIS — E7800 Pure hypercholesterolemia, unspecified: Secondary | ICD-10-CM

## 2013-02-18 MED ORDER — HYDROCODONE-HOMATROPINE 5-1.5 MG/5ML PO SYRP: 5.0000 mL | ORAL_SOLUTION | Freq: Four times a day (QID) | ORAL | Status: AC | PRN

## 2013-02-18 NOTE — Patient Instructions (Signed)
Acute bronchitis symptoms for less than 10 days are generally not helped by antibiotics.  Take over-the-counter expectorants and cough medications such as  Mucinex DM.  Call if there is no improvement in 5 to 7 days or if he developed worsening cough, fever, or new symptoms, such as shortness of breath or chest pain.    

## 2013-02-18 NOTE — Progress Notes (Signed)
Subjective:    Patient ID: Alexis [name], female    DOB: 05-08-1970, 42 y.o.   MRN: [id number]  HPI  Pre-visit discussion using our clinic review tool. No additional management support is needed unless otherwise documented below in the visit note.  42 year old patient who presents with a 7 to ten-day history of sore throat and a chief complaint of cough. Cough is largely nonproductive it interferes with sleep at night there is minimal yellow sputum production. No shortness of breath wheezing or chest pain.  Past Medical History  Diagnosis Date  . Asthma   . Genital warts   . Bronchitis   . UTI (lower urinary tract infection)   . Anxiety   . Depression   . Hyperlipemia   . Acne     History   Social History  . Marital Status: Divorced    Spouse Name: N/A    Number of Children: N/A  . Years of Education: N/A   Occupational History  . Not on file.   Social History Main Topics  . Smoking status: Never Smoker   . Smokeless tobacco: Never Used  . Alcohol Use: No  . Drug Use: No  . Sexual Activity: Yes    Birth Control/ Protection: Surgical   Other Topics Concern  . Not on file   Social History Narrative  . No narrative on file    Past Surgical History  Procedure Laterality Date  . Cesarean section    . Tubal ligation      No family history on file.  No Known Allergies  Current Outpatient Prescriptions on File Prior to Visit  Medication Sig Dispense Refill  . Boric Acid [facility] 600 mg vaginally daily. Boric Acid capsules insert daily x 7 days, PRN      . Multiple Vitamin (MULTIVITAMIN WITH MINERALS) TABS Take 1 tablet by mouth daily.      . Probiotic Product (PROBIOTIC DAILY PO) Take 1 tablet by mouth.      . sertraline (ZOLOFT) 100 MG tablet Take 2 tablets (200 mg total) by mouth daily.  180 tablet  6  . simvastatin (ZOCOR) 40 MG tablet Take 1 tablet (40 mg total) by mouth daily.  90 tablet  4   No current facility-administered medications on file  prior to visit.    BP 120/80  Pulse 85  Temp(Src) 98.2 F (36.8 C) (Oral)  Resp 20  Wt 158 lb (71.668 kg)  SpO2 98%       Review of Systems  Constitutional: Positive for activity change, appetite change and fatigue.  HENT: Positive for rhinorrhea and sinus pressure. Negative for congestion, dental problem, hearing loss, sore throat and tinnitus.   Eyes: Negative for pain, discharge and visual disturbance.  Respiratory: Positive for cough. Negative for shortness of breath.   Cardiovascular: Negative for chest pain, palpitations and leg swelling.  Gastrointestinal: Negative for nausea, vomiting, abdominal pain, diarrhea, constipation, blood in stool and abdominal distention.  Genitourinary: Negative for dysuria, urgency, frequency, hematuria, flank pain, vaginal bleeding, vaginal discharge, difficulty urinating, vaginal pain and pelvic pain.  Musculoskeletal: Negative for arthralgias, gait problem and joint swelling.  Skin: Negative for rash.  Neurological: Negative for dizziness, syncope, speech difficulty, weakness, numbness and headaches.  Hematological: Negative for adenopathy.  Psychiatric/Behavioral: Negative for behavioral problems, dysphoric mood and agitation. The patient is not nervous/anxious.        Objective:   Physical Exam  Constitutional: She is oriented to person, place, and time. She appears well-developed and  well-nourished.  HENT:  Head: Normocephalic.  Right Ear: External ear normal.  Left Ear: External ear normal.  Mouth/Throat: Oropharynx is clear and moist.  Eyes: Conjunctivae and EOM are normal. Pupils are equal, round, and reactive to light.  Neck: Normal range of motion. Neck supple. No thyromegaly present.  Cardiovascular: Normal rate, regular rhythm, normal heart sounds and intact distal pulses.   Pulmonary/Chest: Effort normal and breath sounds normal.  Abdominal: Soft. Bowel sounds are normal. She exhibits no mass. There is no tenderness.   Musculoskeletal: Normal range of motion.  Lymphadenopathy:    She has no cervical adenopathy.  Neurological: She is alert and oriented to person, place, and time.  Skin: Skin is warm and dry. No rash noted.  Psychiatric: She has a normal mood and affect. Her behavior is normal.          Assessment & Plan:   Viral URI with cough. We'll treat symptomatically

## 2013-08-06 ENCOUNTER — Telehealth: Payer: Self-pay | Admitting: Internal Medicine

## 2013-08-06 NOTE — Telephone Encounter (Signed)
Pt is needing new rx for sertraline (ZOLOFT) 100 MG tablet and simvastatin (zocor) 40 mg pt is requesting 90 supply for the meds, sent [facility].

## 2013-08-07 MED ORDER — SIMVASTATIN 40 MG PO TABS: 40.0000 mg | ORAL_TABLET | Freq: Every day | ORAL | Status: DC

## 2013-08-07 MED ORDER — SERTRALINE HCL 100 MG PO TABS: 200.0000 mg | ORAL_TABLET | Freq: Every day | ORAL | Status: DC

## 2013-08-07 NOTE — Telephone Encounter (Signed)
Spoke to pt told her I sent 90 day supply to pharmacy as requested but needs to schedule physical is due in June. Pt verbalized understanding.

## 2013-11-03 ENCOUNTER — Ambulatory Visit (INDEPENDENT_AMBULATORY_CARE_PROVIDER_SITE_OTHER): Payer: PRIVATE HEALTH INSURANCE | Admitting: Internal Medicine

## 2013-11-03 ENCOUNTER — Encounter: Payer: Self-pay | Admitting: Internal Medicine

## 2013-11-03 VITALS — BP 110/72 | HR 89 | Temp 98.3°F | Resp 18 | Ht 61.75 in | Wt 155.0 lb

## 2013-11-03 DIAGNOSIS — E7800 Pure hypercholesterolemia, unspecified: Secondary | ICD-10-CM

## 2013-11-03 DIAGNOSIS — F329 Major depressive disorder, single episode, unspecified: Secondary | ICD-10-CM

## 2013-11-03 DIAGNOSIS — F32A Depression, unspecified: Secondary | ICD-10-CM

## 2013-11-03 DIAGNOSIS — F4323 Adjustment disorder with mixed anxiety and depressed mood: Secondary | ICD-10-CM

## 2013-11-03 DIAGNOSIS — F3289 Other specified depressive episodes: Secondary | ICD-10-CM

## 2013-11-03 MED ORDER — LORAZEPAM 0.5 MG PO TABS: 0.5000 mg | ORAL_TABLET | Freq: Two times a day (BID) | ORAL | Status: DC | PRN

## 2013-11-03 NOTE — Progress Notes (Signed)
Pre visit review using our clinic review tool, if applicable. No additional management support is needed unless otherwise documented below in the visit note. 

## 2013-11-03 NOTE — Progress Notes (Signed)
Subjective:    Patient ID: Alexis [name], female    DOB: 1971-04-02, 43 y.o.   MRN: [id number]  HPI  43 year old patient who has a long history of depression.  She has been on sertraline for a number of years. More recently, she has had increasing depression over the past 2 months.  She has had some situational stressors with teenage children.  Increase her work-related demands.  She also is grieving with the recent death of a close aunt. She continues to receive counseling.  She is pleased with her present professional relationship  Past Medical History  Diagnosis Date  . Asthma   . Genital warts   . Bronchitis   . UTI (lower urinary tract infection)   . Anxiety   . Depression   . Hyperlipemia   . Acne     History   Social History  . Marital Status: Divorced    Spouse Name: N/A    Number of Children: N/A  . Years of Education: N/A   Occupational History  . Not on file.   Social History Main Topics  . Smoking status: Never Smoker   . Smokeless tobacco: Never Used  . Alcohol Use: No  . Drug Use: No  . Sexual Activity: Yes    Birth Control/ Protection: Surgical   Other Topics Concern  . Not on file   Social History Narrative  . No narrative on file    Past Surgical History  Procedure Laterality Date  . Cesarean section    . Tubal ligation      No family history on file.  No Known Allergies  Current Outpatient Prescriptions on File Prior to Visit  Medication Sig Dispense Refill  . Multiple Vitamin (MULTIVITAMIN WITH MINERALS) TABS Take 1 tablet by mouth daily.      . sertraline (ZOLOFT) 100 MG tablet Take 2 tablets (200 mg total) by mouth daily.  180 tablet  0  . simvastatin (ZOCOR) 40 MG tablet Take 1 tablet (40 mg total) by mouth daily.  90 tablet  0   No current facility-administered medications on file prior to visit.    BP 110/72  Pulse 89  Temp(Src) 98.3 F (36.8 C) (Oral)  Resp 18  Ht 5' 1.75" (1.568 m)  Wt 155 lb (70.308 kg)  BMI  28.60 kg/m2  SpO2 98%  LMP 10/27/2013     Review of Systems  Constitutional: Negative.   HENT: Negative for congestion, dental problem, hearing loss, rhinorrhea, sinus pressure, sore throat and tinnitus.   Eyes: Negative for pain, discharge and visual disturbance.  Respiratory: Negative for cough and shortness of breath.   Cardiovascular: Negative for chest pain, palpitations and leg swelling.  Gastrointestinal: Negative for nausea, vomiting, abdominal pain, diarrhea, constipation, blood in stool and abdominal distention.  Genitourinary: Negative for dysuria, urgency, frequency, hematuria, flank pain, vaginal bleeding, vaginal discharge, difficulty urinating, vaginal pain and pelvic pain.  Musculoskeletal: Negative for arthralgias, gait problem and joint swelling.  Skin: Negative for rash.  Neurological: Negative for dizziness, syncope, speech difficulty, weakness, numbness and headaches.  Hematological: Negative for adenopathy.  Psychiatric/Behavioral: Positive for dysphoric mood and decreased concentration. Negative for suicidal ideas, behavioral problems, self-injury and agitation. The patient is nervous/anxious.        Objective:   Physical Exam  Constitutional: She appears well-developed and well-nourished. She appears distressed.  Psychiatric: Her behavior is normal. Judgment and thought content normal.  Depressed affect          Assessment &  Plan:   Anxiety, depression.  Bladder scan to take when necessary.  She'll continue counseling. She has been tried on Ambilify by another provider.  This was not well-tolerated nor helpful.  We'll continue sertraline and consider psychiatric referral if unimproved

## 2013-11-03 NOTE — Patient Instructions (Signed)
Continue follow up with your counselor  Call or return to clinic prn if these symptoms worsen or fail to improve as anticipated.

## 2013-11-11 ENCOUNTER — Telehealth: Payer: Self-pay | Admitting: Internal Medicine

## 2013-11-11 MED ORDER — SIMVASTATIN 40 MG PO TABS: 40.0000 mg | ORAL_TABLET | Freq: Every day | ORAL | Status: DC

## 2013-11-11 MED ORDER — SERTRALINE HCL 100 MG PO TABS: 200.0000 mg | ORAL_TABLET | Freq: Every day | ORAL | Status: DC

## 2013-11-11 NOTE — Telephone Encounter (Signed)
Pt request refill of the following: sertraline (ZOLOFT) 100 MG tablet, simvastatin (ZOCOR) 40 MG tablet     Phamacy:   [facility]

## 2013-11-11 NOTE — Telephone Encounter (Signed)
Pt notified Rx's sent to pharmacy. 

## 2013-11-18 ENCOUNTER — Telehealth: Payer: Self-pay | Admitting: Internal Medicine

## 2013-11-18 MED ORDER — FLUCONAZOLE 150 MG PO TABS: 150.0000 mg | ORAL_TABLET | Freq: Once | ORAL | Status: DC

## 2013-11-18 NOTE — Telephone Encounter (Signed)
Please advise 

## 2013-11-18 NOTE — Telephone Encounter (Signed)
Pt was seen on 11-03-13 and needs boric acid for yeast infection. Pt has tried otc no relief.  [place]

## 2013-11-18 NOTE — Telephone Encounter (Signed)
Spoke to pt told her Dr. Raliegh Ip would like her to try Diflucan 150 mg tablet, take one now and repeat in one week. Rx sent to pharmacy. Pt verbalized understanding.

## 2013-11-18 NOTE — Telephone Encounter (Signed)
Try Diflucan 150 mg  #2 one dose now repeat in one week

## 2013-12-01 ENCOUNTER — Encounter: Payer: Self-pay | Admitting: Internal Medicine

## 2013-12-01 ENCOUNTER — Ambulatory Visit (INDEPENDENT_AMBULATORY_CARE_PROVIDER_SITE_OTHER): Payer: PRIVATE HEALTH INSURANCE | Admitting: Internal Medicine

## 2013-12-01 VITALS — BP 111/76 | HR 112 | Temp 99.1°F | Resp 20 | Ht 62.25 in | Wt 159.0 lb

## 2013-12-01 DIAGNOSIS — F3289 Other specified depressive episodes: Secondary | ICD-10-CM

## 2013-12-01 DIAGNOSIS — F32A Depression, unspecified: Secondary | ICD-10-CM

## 2013-12-01 DIAGNOSIS — E7800 Pure hypercholesterolemia, unspecified: Secondary | ICD-10-CM

## 2013-12-01 DIAGNOSIS — N200 Calculus of kidney: Secondary | ICD-10-CM

## 2013-12-01 DIAGNOSIS — F329 Major depressive disorder, single episode, unspecified: Secondary | ICD-10-CM

## 2013-12-01 DIAGNOSIS — Z0000 Encounter for general adult medical examination without abnormal findings: Secondary | ICD-10-CM

## 2013-12-01 DIAGNOSIS — K219 Gastro-esophageal reflux disease without esophagitis: Secondary | ICD-10-CM

## 2013-12-01 LAB — CBC WITH DIFFERENTIAL/PLATELET
BASOS ABS: 0 10*3/uL (ref 0.0–0.1)
Basophils Relative: 0.3 % (ref 0.0–3.0)
EOS ABS: 0.1 10*3/uL (ref 0.0–0.7)
EOS PCT: 0.6 % (ref 0.0–5.0)
HCT: 36.2 % (ref 36.0–46.0)
Hemoglobin: 11.9 g/dL — ABNORMAL LOW (ref 12.0–15.0)
Lymphocytes Relative: 14 % (ref 12.0–46.0)
Lymphs Abs: 1.3 10*3/uL (ref 0.7–4.0)
MCHC: 32.8 g/dL (ref 30.0–36.0)
MCV: 82.3 fl (ref 78.0–100.0)
MONO ABS: 0.4 10*3/uL (ref 0.1–1.0)
Monocytes Relative: 4.7 % (ref 3.0–12.0)
NEUTROS PCT: 80.4 % — AB (ref 43.0–77.0)
Neutro Abs: 7.5 10*3/uL (ref 1.4–7.7)
PLATELETS: 275 10*3/uL (ref 150.0–400.0)
RBC: 4.39 Mil/uL (ref 3.87–5.11)
RDW: 15.5 % (ref 11.5–15.5)
WBC: 9.3 10*3/uL (ref 4.0–10.5)

## 2013-12-01 LAB — LIPID PANEL
CHOLESTEROL: 167 mg/dL (ref 0–200)
HDL: 40.9 mg/dL (ref >39.00)
LDL CALC: 110 mg/dL — AB (ref 0–99)
NonHDL: 126.1
Total CHOL/HDL Ratio: 4
Triglycerides: 83 mg/dL (ref 0.0–149.0)
VLDL: 16.6 mg/dL (ref 0.0–40.0)

## 2013-12-01 LAB — COMPREHENSIVE METABOLIC PANEL
ALBUMIN: 4 g/dL (ref 3.5–5.2)
ALK PHOS: 51 U/L (ref 39–117)
ALT: 80 U/L — ABNORMAL HIGH (ref 0–35)
AST: 47 U/L — ABNORMAL HIGH (ref 0–37)
BUN: 9 mg/dL (ref 6–23)
CO2: 28 meq/L (ref 19–32)
Calcium: 9.1 mg/dL (ref 8.4–10.5)
Chloride: 101 mEq/L (ref 96–112)
Creatinine, Ser: 0.8 mg/dL (ref 0.4–1.2)
GFR: 106.78 mL/min (ref >60.00)
GLUCOSE: 103 mg/dL — AB (ref 70–99)
POTASSIUM: 3.7 meq/L (ref 3.5–5.1)
SODIUM: 138 meq/L (ref 135–145)
TOTAL PROTEIN: 7.6 g/dL (ref 6.0–8.3)
Total Bilirubin: 0.7 mg/dL (ref 0.2–1.2)

## 2013-12-01 LAB — TSH: TSH: 0.3 u[IU]/mL — AB (ref 0.35–4.50)

## 2013-12-01 NOTE — Patient Instructions (Addendum)
Acute bronchitis symptoms for less than 10 days are generally not helped by antibiotics.  Take over-the-counter expectorants and cough medications such as  Mucinex DM.  Call if there is no improvement in 5 to 7 days or if  you develop worsening cough, fever, or new symptoms, such as shortness of breath or chest pain.    It is important that you exercise regularly, at least 20 minutes 3 to 4 times per week.  If you develop chest pain or shortness of breath seek  medical attention.  Return in 6 months for follow-up  Schedule your mammogram.

## 2013-12-01 NOTE — Progress Notes (Signed)
Subjective:    Patient ID: Annye English, female    DOB: November 18, 1970, 43 y.o.   MRN: [id number]  HPI  43 year old patient who is seen today in for a preventive health examination.  She has a history of hypercholesterolemia and has been treated for approximately 5 years. She has been on sertraline for anxiety depression for approximately 10 years. Additional medical problems include mild gastroesophageal reflux disease as well as chronic constipation issues. She passed a kidney stone in February of last year. Past few days.  She has had some URI symptoms with weakness, chest congestion, sinus congestion and cough.  She has had malaise.   She is followed by gynecology and did have a pelvic and Pap.  2 years ago.  No screening mammograms   Past medical history is also pertinent for a history of laparoscopy for abdominal pain in 2012. 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 She has been a 15 year resident of [place] and went to school at [employer]. and [employer] regular exercise 2 sons age 70 and 82  Family history father age 76 with hypertension hypercholesterolemia and BPH. Mother is 43 with hypertension. 2 brothers one with schizophrenia. 2 sisters are well. No family history of cancer    Review of Systems  Constitutional: Negative for fever, appetite change, fatigue and unexpected weight change.  HENT: Negative for congestion, dental problem, ear pain, hearing loss, mouth sores, nosebleeds, sinus pressure, sore throat, tinnitus, trouble swallowing and voice change.   Eyes: Negative for photophobia, pain, redness and visual disturbance.  Respiratory: Negative for cough, chest tightness and shortness of breath.   Cardiovascular: Negative for chest pain, palpitations and leg swelling.  Gastrointestinal: Positive for constipation. Negative for nausea, vomiting, abdominal pain,  diarrhea, blood in stool, abdominal distention and rectal pain.  Genitourinary: Negative for dysuria, urgency, frequency, hematuria, flank pain, vaginal bleeding, vaginal discharge, difficulty urinating, genital sores, vaginal pain, menstrual problem and pelvic pain.  Musculoskeletal: Negative for arthralgias, back pain and neck stiffness.  Skin: Negative for rash.       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  Hematological: Negative for adenopathy. Does not bruise/bleed easily.  Psychiatric/Behavioral: Negative for suicidal ideas, behavioral problems, self-injury, dysphoric mood and agitation. The patient is not nervous/anxious.    and     Objective:   Physical Exam  Constitutional: She is oriented to person, place, and time. She appears well-developed and well-nourished.  Blood pressure low normal  HENT:  Head: Normocephalic.  Right Ear: External ear normal.  Left Ear: External ear normal.  Mouth/Throat: Oropharynx is clear and moist.  Eyes: Conjunctivae and EOM are normal. Pupils are equal, round, and reactive to light.  Neck: Normal range of motion. Neck supple. No thyromegaly present.  Cardiovascular: Normal rate, regular rhythm, normal heart sounds and intact distal pulses.   Pulmonary/Chest: Effort normal and breath sounds normal.  Abdominal: Soft. Bowel sounds are normal. She exhibits no mass. There is no tenderness.  Musculoskeletal: Normal range of motion.  Lymphadenopathy:    She has no cervical adenopathy.  Neurological: She is alert and oriented to person, place, and time.  Skin: Skin is warm and dry. No rash noted.  Psychiatric: She has a normal mood and affect. Her behavior is normal.  Assessment & Plan:    Preventive health exam Hypercholesterolemia Viral URI.  Will treat symptomatically  Anxiety depression. Stable GERD.  Anti-reflex regimen discussed  We'll check a lipid profile and screening lab

## 2013-12-01 NOTE — Progress Notes (Signed)
Pre visit review using our clinic review tool, if applicable. No additional management support is needed unless otherwise documented below in the visit note. 

## 2013-12-02 ENCOUNTER — Telehealth: Payer: Self-pay | Admitting: Internal Medicine

## 2013-12-02 NOTE — Telephone Encounter (Signed)
Pt would like a cb concerning her visit yesterday.

## 2013-12-03 ENCOUNTER — Other Ambulatory Visit: Payer: Self-pay | Admitting: Internal Medicine

## 2013-12-03 DIAGNOSIS — R748 Abnormal levels of other serum enzymes: Secondary | ICD-10-CM

## 2013-12-03 NOTE — Telephone Encounter (Signed)
Spoke to pt, wants to know if there is something else she can take for congestion? Pt taking Mucinex and Claritin little relief and c/o headaches.

## 2013-12-03 NOTE — Telephone Encounter (Signed)
Please see message and advise 

## 2013-12-04 NOTE — Telephone Encounter (Signed)
Use saline irrigation, warm  moist compresses and over-the-counter decongestants only as directed.  Call if there is no improvement in 5 to 7 days, or sooner if you develop increasing pain, fever, or any new symptoms. 

## 2013-12-04 NOTE — Telephone Encounter (Signed)
Spoke to pt told her to use saline irrigation, warm moist compresses and over-the-counter decongestants only as directed. Call if there is no improvement in 5 to 7 days, or sooner if you develop increasing pain, fever, or any new symptoms per Dr. Raliegh Ip. Pt verbalized understanding and wanted to know if Dr. Raliegh Ip will extend work note. Told pt will check with him and get back to you. Pt verbalized understanding.

## 2013-12-04 NOTE — Telephone Encounter (Signed)
Spoke to pt, told her I have a new work excuse for you can pickup at the front desk. Pt verbalized understanding.

## 2014-02-02 ENCOUNTER — Encounter: Payer: Self-pay | Admitting: Internal Medicine

## 2014-02-03 ENCOUNTER — Encounter: Payer: Self-pay | Admitting: Gastroenterology

## 2014-02-05 ENCOUNTER — Other Ambulatory Visit: Payer: Self-pay | Admitting: Obstetrics and Gynecology

## 2014-02-05 DIAGNOSIS — R928 Other abnormal and inconclusive findings on diagnostic imaging of breast: Secondary | ICD-10-CM

## 2014-02-20 ENCOUNTER — Other Ambulatory Visit: Payer: Self-pay

## 2014-02-24 ENCOUNTER — Ambulatory Visit
Admission: RE | Admit: 2014-02-24 | Discharge: 2014-02-24 | Disposition: A | Discharge status: Home / Self Care | Payer: PRIVATE HEALTH INSURANCE | Source: Ambulatory Visit | Attending: Obstetrics and Gynecology | Admitting: Obstetrics and Gynecology

## 2014-02-24 DIAGNOSIS — R928 Other abnormal and inconclusive findings on diagnostic imaging of breast: Secondary | ICD-10-CM

## 2014-03-21 ENCOUNTER — Encounter (HOSPITAL_COMMUNITY): Payer: Self-pay | Admitting: Emergency Medicine

## 2014-03-21 ENCOUNTER — Emergency Department (INDEPENDENT_AMBULATORY_CARE_PROVIDER_SITE_OTHER)
Admission: EM | Admit: 2014-03-21 | Discharge: 2014-03-21 | Disposition: A | Discharge status: Home / Self Care | Payer: PRIVATE HEALTH INSURANCE | Source: Home / Self Care | Attending: Emergency Medicine | Admitting: Emergency Medicine

## 2014-03-21 DIAGNOSIS — M5432 Sciatica, left side: Secondary | ICD-10-CM

## 2014-03-21 DIAGNOSIS — M5431 Sciatica, right side: Secondary | ICD-10-CM

## 2014-03-21 MED ORDER — METHYLPREDNISOLONE (PAK) 4 MG PO TABS: ORAL_TABLET | ORAL | Status: DC

## 2014-03-21 MED ORDER — FAMOTIDINE 20 MG PO TABS: 20.0000 mg | ORAL_TABLET | Freq: Two times a day (BID) | ORAL | Status: DC

## 2014-03-21 MED ORDER — KETOROLAC TROMETHAMINE 60 MG/2ML IM SOLN
60.0000 mg | Freq: Once | INTRAMUSCULAR | Status: AC
  Administered 2014-03-21: 60 mg via INTRAMUSCULAR

## 2014-03-21 MED ORDER — TRAMADOL HCL 50 MG PO TABS
50.0000 mg | ORAL_TABLET | Freq: Three times a day (TID) | ORAL | Status: DC | PRN
Start: 2014-03-21 — End: 2014-04-07

## 2014-03-21 MED ORDER — KETOROLAC TROMETHAMINE 60 MG/2ML IM SOLN
INTRAMUSCULAR | Status: AC
  Filled 2014-03-21: qty 2

## 2014-03-21 NOTE — ED Notes (Signed)
Pt c/o lower back pain onset yest; increases w/activity Denies inj/trauma; hx of IBS/constipation Also reports she developed a constant HA Describes pain as "pounding"; also increases w/bright light and noise Alert, no signs of acute distress.

## 2014-03-21 NOTE — ED Provider Notes (Signed)
CSN: [id number]     Arrival date & time 03/21/14  1546 History   First MD Initiated Contact with Patient 03/21/14 1644     Chief Complaint  Patient presents with  . Back Pain  . Headache   (Consider location/radiation/quality/duration/timing/severity/associated sxs/prior Treatment) [name] presenting with back pain and headaches. The history is provided by the patient.  Back Pain Location:  Lumbar spine Quality:  Aching Radiates to: to bilateral buttock. Pain severity:  Moderate Onset quality:  Gradual Duration:  24 hours Timing:  Constant Progression:  Worsening Chronicity:  New Context: not falling, not lifting heavy objects, not recent illness and not recent injury   Relieved by:  Ibuprofen Worsened by:  Bending and sitting Associated symptoms: headaches   Associated symptoms: no abdominal pain, no bladder incontinence, no bowel incontinence, no dysuria, no fever, no leg pain, no numbness, no paresthesias, no pelvic pain, no perianal numbness, no tingling and no weakness   Headache Associated symptoms: back pain   Associated symptoms: no abdominal pain, no fatigue, no fever, no neck pain, no numbness and no paresthesias     Past Medical History  Diagnosis Date  . Asthma   . Genital warts   . Bronchitis   . UTI (lower urinary tract infection)   . Anxiety   . Depression   . Hyperlipemia   . Acne    Past Surgical History  Procedure Laterality Date  . Cesarean section    . Tubal ligation     No family history on file. History  Substance Use Topics  . Smoking status: Never Smoker   . Smokeless tobacco: Never Used  . Alcohol Use: No   OB History    Gravida Para Term Preterm AB TAB SAB Ectopic Multiple Living   2 2 1 1      2      Review of Systems  Constitutional: Negative for fever, chills and fatigue.  Respiratory: Negative.   Cardiovascular: Negative.   Gastrointestinal: Negative.  Negative for abdominal pain and bowel incontinence.    Genitourinary: Negative for bladder incontinence, dysuria, urgency, frequency, hematuria, flank pain, vaginal bleeding, vaginal discharge, difficulty urinating, vaginal pain and pelvic pain.  Musculoskeletal: Positive for back pain. Negative for neck pain.  Skin: Negative.   Neurological: Positive for headaches. Negative for tingling, weakness, numbness and paresthesias.    Allergies  Review of patient's allergies indicates no known allergies.  Home Medications   Prior to Admission medications   Medication Sig Start Date End Date Taking? Authorizing Provider  Omega-3 Krill Oil 300 MG CAPS Take 1 capsule by mouth daily.   Yes Historical Provider, MD  sertraline (ZOLOFT) 100 MG tablet Take 2 tablets (200 mg total) by mouth daily. 11/11/13  Yes Alexis Lor, MD  simvastatin (ZOCOR) 40 MG tablet Take 1 tablet (40 mg total) by mouth daily. 11/11/13  Yes Alexis Lor, MD  famotidine (PEPCID) 20 MG tablet Take 1 tablet (20 mg total) by mouth 2 (two) times daily. While taking medrol. May discontinue when Medrol finished 03/21/14   Alexis [name], Alexis [name]  fluconazole (DIFLUCAN) 150 MG tablet Take 1 tablet (150 mg total) by mouth once. 11/18/13   Alexis Lor, MD  LORazepam (ATIVAN) 0.5 MG tablet Take 1 tablet (0.5 mg total) by mouth 2 (two) times daily as needed for anxiety. 11/03/13   Alexis Lor, MD  methylPREDNIsolone (MEDROL DOSPACK) 4 MG tablet follow package directions 03/21/14   Alexis [name]  Alexis [name], Alexis [name]  Misc Natural Products [facility]) CAPS Take 1 capsule by mouth daily.    Historical Provider, MD  Multiple Vitamin (HEALTHY HAIR/SKIN/NAILS PO) Take 1 capsule by mouth daily.    Historical Provider, MD  Multiple Vitamin (MULTIVITAMIN WITH MINERALS) TABS Take 1 tablet by mouth daily.    Historical Provider, MD  traMADol (ULTRAM) 50 MG tablet Take 1 tablet (50 mg total) by mouth every 8 (eight) hours as needed. For pain 03/21/14   Alexis [name] Alexis Alexis [name], Alexis [name]    BP 114/76 mmHg  Pulse 86  Temp(Src) 98.5 F (36.9 C) (Oral)  Resp 16  SpO2 96%  LMP 03/08/2014 Physical Exam  Constitutional: She is oriented to person, place, and time. She appears well-developed and well-nourished. No distress.  HENT:  Head: Normocephalic and atraumatic.  Eyes: Conjunctivae are normal. No scleral icterus.  Neck: Normal range of motion. Neck supple.  Cardiovascular: Normal rate, regular rhythm and normal heart sounds.   Pulmonary/Chest: Effort normal and breath sounds normal.  Abdominal: Soft. Normal appearance and bowel sounds are normal. She exhibits no distension. There is no tenderness. There is no rigidity, no rebound, no guarding and no CVA tenderness.  Musculoskeletal:       Lumbar back: She exhibits decreased range of motion and tenderness. She exhibits no bony tenderness, no swelling, no edema, no deformity and no laceration.       Back:  +SLR bilaterally. CSM exam of both lower extremities intact. Patient ambulatory without assistance  Neurological: She is alert and oriented to person, place, and time. She has normal strength. Coordination normal.  Reflex Scores:      Patellar reflexes are 2+ on the right side and 2+ on the left side. +antalgic gait Great toe dorsiflexion 5/5 bilaterally  Skin: Skin is warm and dry. No rash noted. No erythema.  Psychiatric: She has a normal mood and affect. Her behavior is normal.  Nursing note and vitals reviewed.   ED Course  Procedures (including critical care time) Labs Review Labs Reviewed - No data to display  Imaging Review No results found.   MDM   1. Sciatica neuralgia, left   2. Sciatica neuralgia, right    Medrol dose pack and tramadol for pain management at home with Pepcid while taking oral steroid to try to prevent gastritis. No clinical indication of cauda equina syndrome or acute neuromuscular deficit. No abdominal complaint. PCP or [facility]CC follow up if no improvement Toradol 60 mg IM given at  [facility] for pain management.   Alexis [name], [place] 03/21/14 [id number]

## 2014-03-21 NOTE — Discharge Instructions (Signed)
Back Pain, Adult Back pain is very common. The pain often gets better over time. The cause of back pain is usually not dangerous. Most people can learn to manage their back pain on their own.  HOME CARE   Stay active. Start with short walks on flat ground if you can. Try to walk farther each day.  Do not sit, drive, or stand in one place for more than 30 minutes. Do not stay in bed.  Do not avoid exercise or work. Activity can help your back heal faster.  Be careful when you bend or lift an object. Bend at your knees, keep the object close to you, and do not twist.  Sleep on a firm mattress. Lie on your side, and bend your knees. If you lie on your back, put a pillow under your knees.  Only take medicines as told by your doctor.  Put ice on the injured area.  Put ice in a plastic bag.  Place a towel between your skin and the bag.  Leave the ice on for 15-20 minutes, 03-04 times a day for the first 2 to 3 days. After that, you can switch between ice and heat packs.  Ask your doctor about back exercises or massage.  Avoid feeling anxious or stressed. Find good ways to deal with stress, such as exercise. GET HELP RIGHT AWAY IF:   Your pain does not go away with rest or medicine.  Your pain does not go away in 1 week.  You have new problems.  You do not feel well.  The pain spreads into your legs.  You cannot control when you poop (bowel movement) or pee (urinate).  Your arms or legs feel weak or lose feeling (numbness).  You feel sick to your stomach (nauseous) or throw up (vomit).  You have belly (abdominal) pain.  You feel like you may pass out (faint). MAKE SURE YOU:   Understand these instructions.  Will watch your condition.  Will get help right away if you are not doing well or get worse. Document Released: 09/06/2007 Document Revised: 06/12/2011 Document Reviewed: 07/22/2013 [facility] Patient Information 2015 [place], [place]. This information is not intended  to replace advice given to you by your health care provider. Make sure you discuss any questions you have with your health care provider.  Sciatica Sciatica is pain, weakness, numbness, or tingling along the path of the sciatic nerve. The nerve starts in the lower back and runs down the back of each leg. The nerve controls the muscles in the lower leg and in the back of the knee, while also providing sensation to the back of the thigh, lower leg, and the sole of your foot. Sciatica is a symptom of another medical condition. For instance, nerve damage or certain conditions, such as a herniated disk or bone spur on the spine, pinch or put pressure on the sciatic nerve. This causes the pain, weakness, or other sensations normally associated with sciatica. Generally, sciatica only affects one side of the body. CAUSES   Herniated or slipped disc.  Degenerative disk disease.  A pain disorder involving the narrow muscle in the buttocks (piriformis syndrome).  Pelvic injury or fracture.  Pregnancy.  Tumor (rare). SYMPTOMS  Symptoms can vary from mild to very severe. The symptoms usually travel from the low back to the buttocks and down the back of the leg. Symptoms can include:  Mild tingling or dull aches in the lower back, leg, or hip.  Numbness in the  back of the calf or sole of the foot.  Burning sensations in the lower back, leg, or hip.  Sharp pains in the lower back, leg, or hip.  Leg weakness.  Severe back pain inhibiting movement. These symptoms may get worse with coughing, sneezing, laughing, or prolonged sitting or standing. Also, being overweight may worsen symptoms. DIAGNOSIS  Your caregiver will perform a physical exam to look for common symptoms of sciatica. He or she may ask you to do certain movements or activities that would trigger sciatic nerve pain. Other tests may be performed to find the cause of the sciatica. These may include:  Blood tests.  X-rays.  Imaging  tests, such as an MRI or CT scan. TREATMENT  Treatment is directed at the cause of the sciatic pain. Sometimes, treatment is not necessary and the pain and discomfort goes away on its own. If treatment is needed, your caregiver may suggest:  Over-the-counter medicines to relieve pain.  Prescription medicines, such as anti-inflammatory medicine, muscle relaxants, or narcotics.  Applying heat or ice to the painful area.  Steroid injections to lessen pain, irritation, and inflammation around the nerve.  Reducing activity during periods of pain.  Exercising and stretching to strengthen your abdomen and improve flexibility of your spine. Your caregiver may suggest losing weight if the extra weight makes the back pain worse.  Physical therapy.  Surgery to eliminate what is pressing or pinching the nerve, such as a bone spur or part of a herniated disk. HOME CARE INSTRUCTIONS   Only take over-the-counter or prescription medicines for pain or discomfort as directed by your caregiver.  Apply ice to the affected area for 20 minutes, 3-4 times a day for the first 48-72 hours. Then try heat in the same way.  Exercise, stretch, or perform your usual activities if these do not aggravate your pain.  Attend physical therapy sessions as directed by your caregiver.  Keep all follow-up appointments as directed by your caregiver.  Do not wear high heels or shoes that do not provide proper support.  Check your mattress to see if it is too soft. A firm mattress may lessen your pain and discomfort. SEEK IMMEDIATE MEDICAL CARE IF:   You lose control of your bowel or bladder (incontinence).  You have increasing weakness in the lower back, pelvis, buttocks, or legs.  You have redness or swelling of your back.  You have a burning sensation when you urinate.  You have pain that gets worse when you lie down or awakens you at night.  Your pain is worse than you have experienced in the past.  Your  pain is lasting longer than 4 weeks.  You are suddenly losing weight without reason. MAKE SURE YOU:  Understand these instructions.  Will watch your condition.  Will get help right away if you are not doing well or get worse. Document Released: 03/14/2001 Document Revised: 09/19/2011 Document Reviewed: 07/30/2011 [facility] Patient Information 2015 [place], [place]. This information is not intended to replace advice given to you by your health care provider. Make sure you discuss any questions you have with your health care provider.

## 2014-04-07 ENCOUNTER — Other Ambulatory Visit (INDEPENDENT_AMBULATORY_CARE_PROVIDER_SITE_OTHER): Payer: PRIVATE HEALTH INSURANCE

## 2014-04-07 ENCOUNTER — Telehealth: Payer: Self-pay | Admitting: Gastroenterology

## 2014-04-07 ENCOUNTER — Encounter: Payer: Self-pay | Admitting: Gastroenterology

## 2014-04-07 ENCOUNTER — Ambulatory Visit (INDEPENDENT_AMBULATORY_CARE_PROVIDER_SITE_OTHER): Payer: PRIVATE HEALTH INSURANCE | Admitting: Gastroenterology

## 2014-04-07 VITALS — BP 104/70 | HR 68 | Ht 61.75 in | Wt 160.6 lb

## 2014-04-07 DIAGNOSIS — R7989 Other specified abnormal findings of blood chemistry: Secondary | ICD-10-CM

## 2014-04-07 DIAGNOSIS — R109 Unspecified abdominal pain: Secondary | ICD-10-CM

## 2014-04-07 DIAGNOSIS — R748 Abnormal levels of other serum enzymes: Secondary | ICD-10-CM

## 2014-04-07 DIAGNOSIS — R945 Abnormal results of liver function studies: Secondary | ICD-10-CM

## 2014-04-07 DIAGNOSIS — K5900 Constipation, unspecified: Secondary | ICD-10-CM

## 2014-04-07 LAB — COMPREHENSIVE METABOLIC PANEL
ALT: 96 U/L — ABNORMAL HIGH (ref 0–35)
AST: 59 U/L — ABNORMAL HIGH (ref 0–37)
Albumin: 4.3 g/dL (ref 3.5–5.2)
Alkaline Phosphatase: 57 U/L (ref 39–117)
BUN: 12 mg/dL (ref 6–23)
CALCIUM: 9.3 mg/dL (ref 8.4–10.5)
CHLORIDE: 106 meq/L (ref 96–112)
CO2: 26 meq/L (ref 19–32)
CREATININE: 0.8 mg/dL (ref 0.4–1.2)
GFR: 99.05 mL/min (ref >60.00)
GLUCOSE: 113 mg/dL — AB (ref 70–99)
Potassium: 4.1 mEq/L (ref 3.5–5.1)
Sodium: 142 mEq/L (ref 135–145)
Total Bilirubin: 0.6 mg/dL (ref 0.2–1.2)
Total Protein: 7.6 g/dL (ref 6.0–8.3)

## 2014-04-07 LAB — CBC WITH DIFFERENTIAL/PLATELET
BASOS ABS: 0 10*3/uL (ref 0.0–0.1)
Basophils Relative: 0.4 % (ref 0.0–3.0)
Eosinophils Absolute: 0.2 10*3/uL (ref 0.0–0.7)
Eosinophils Relative: 2.7 % (ref 0.0–5.0)
HCT: 37.4 % (ref 36.0–46.0)
Hemoglobin: 11.9 g/dL — ABNORMAL LOW (ref 12.0–15.0)
LYMPHS PCT: 18.1 % (ref 12.0–46.0)
Lymphs Abs: 1.1 10*3/uL (ref 0.7–4.0)
MCHC: 32 g/dL (ref 30.0–36.0)
MCV: 81.2 fl (ref 78.0–100.0)
MONOS PCT: 5 % (ref 3.0–12.0)
Monocytes Absolute: 0.3 10*3/uL (ref 0.1–1.0)
NEUTROS PCT: 73.8 % (ref 43.0–77.0)
Neutro Abs: 4.4 10*3/uL (ref 1.4–7.7)
Platelets: 333 10*3/uL (ref 150.0–400.0)
RBC: 4.6 Mil/uL (ref 3.87–5.11)
RDW: 14.5 % (ref 11.5–15.5)
WBC: 6 10*3/uL (ref 4.0–10.5)

## 2014-04-07 LAB — HEPATIC FUNCTION PANEL
ALK PHOS: 57 U/L (ref 39–117)
ALT: 96 U/L — AB (ref 0–35)
AST: 59 U/L — ABNORMAL HIGH (ref 0–37)
Albumin: 4.3 g/dL (ref 3.5–5.2)
BILIRUBIN DIRECT: 0.1 mg/dL (ref 0.0–0.3)
BILIRUBIN TOTAL: 0.6 mg/dL (ref 0.2–1.2)
TOTAL PROTEIN: 7.6 g/dL (ref 6.0–8.3)

## 2014-04-07 MED ORDER — LUBIPROSTONE 8 MCG PO CAPS: 8.0000 ug | ORAL_CAPSULE | Freq: Two times a day (BID) | ORAL | Status: DC

## 2014-04-07 MED ORDER — LINACLOTIDE 145 MCG PO CAPS: 145.0000 ug | ORAL_CAPSULE | Freq: Every day | ORAL | Status: DC

## 2014-04-07 MED ORDER — MOVIPREP 100 G PO SOLR
1.0000 | Freq: Once | ORAL | Status: DC
Start: 2014-04-07 — End: 2014-05-12

## 2014-04-07 NOTE — Telephone Encounter (Signed)
Yes, amitiza 17mcg pills, take one pill twice daily WITH FOOD.  Disp 1 month with 11 refills.  Thanks

## 2014-04-07 NOTE — Patient Instructions (Addendum)
You will have labs checked today in the basement lab.  Please head down after you check out with the front desk  (cbc, cmet) You will be set up for a colonoscopy for constipation, abdominal pain. New prescription for linzess was called in today.

## 2014-04-07 NOTE — Telephone Encounter (Signed)
amitiza has been sent as requested by the pt

## 2014-04-07 NOTE — Telephone Encounter (Signed)
Can she have Amitiza instead of Linzess?

## 2014-04-07 NOTE — Telephone Encounter (Signed)
Pt called back and said her Ins. Will cover Amitiza

## 2014-04-07 NOTE — Progress Notes (Signed)
HPI: This is a  very pleasant 44 year old woman whom I am meeting for the first time today  Has had intermittent abd pains.  Had endometrial ablation last year followed by abd pains, underwent laparascopy shortly afterwards and found to have adhesions.  In 2012 laparoscopy, adhesions.  Constipation for several years, 5 at least.  Will have BM every 3-4 days, with a lot of straining.  Minor blood occasionally.  Drinks herbal tea QOD and she's not sure if this helps too much.    She has tried miralax, dulcolax, she has tried fiber, stool softners.  She has tried [employer] and others for months without real success.  Overall has gained 10 pounds in the past year.  No colon cancer in her family.  Never had a colonoscopy.  Pain R flank definitely, sharp pain, brief apins, always improved with BM.  Review of systems: Pertinent positive and negative review of systems were noted in the above HPI section. Complete review of systems was performed and was otherwise normal.    Past Medical History  Diagnosis Date  . Asthma   . Genital warts   . Bronchitis   . UTI (lower urinary tract infection)   . Anxiety   . Depression   . Hyperlipemia   . Acne   . Irritable bowel syndrome     Past Surgical History  Procedure Laterality Date  . Cesarean section    . Tubal ligation      Current Outpatient Prescriptions  Medication Sig Dispense Refill  . famotidine (PEPCID) 20 MG tablet Take 1 tablet (20 mg total) by mouth 2 (two) times daily. While taking medrol. May discontinue when Medrol finished 30 tablet 0  . fluconazole (DIFLUCAN) 150 MG tablet Take 1 tablet (150 mg total) by mouth once. 2 tablet 0  . LORazepam (ATIVAN) 0.5 MG tablet Take 1 tablet (0.5 mg total) by mouth 2 (two) times daily as needed for anxiety. 60 tablet 1  . Misc Natural Products [facility]) CAPS Take 1 capsule by mouth daily.    . Multiple Vitamin (HEALTHY HAIR/SKIN/NAILS PO) Take 1 capsule by mouth daily.    .  Multiple Vitamin (MULTIVITAMIN WITH MINERALS) TABS Take 1 tablet by mouth daily.    . Omega-3 Krill Oil 300 MG CAPS Take 1 capsule by mouth daily.    . sertraline (ZOLOFT) 100 MG tablet Take 2 tablets (200 mg total) by mouth daily. 180 tablet 1  . simvastatin (ZOCOR) 40 MG tablet Take 1 tablet (40 mg total) by mouth daily. 90 tablet 1   No current facility-administered medications for this visit.    Allergies as of 04/07/2014  . (No Known Allergies)    History reviewed. No pertinent family history.  History   Social History  . Marital Status: Divorced    Spouse Name: N/A    Number of Children: 2  . Years of Education: N/A   Occupational History  . [employer]    Social History Main Topics  . Smoking status: Never Smoker   . Smokeless tobacco: Never Used  . Alcohol Use: No  . Drug Use: No  . Sexual Activity: Yes    Birth Control/ Protection: Surgical   Other Topics Concern  . Not on file   Social History Narrative       Physical Exam: BP 104/70 mmHg  Pulse 68  Ht 5' 1.75" (1.568 m)  Wt 160 lb 9.6 oz (72.848 kg)  BMI 29.63 kg/m2  LMP 04/03/2014 (Approximate) Constitutional: generally well-appearing  Psychiatric: alert and oriented x3 Eyes: extraocular movements intact Mouth: oral pharynx moist, no lesions Neck: supple no lymphadenopathy Cardiovascular: heart regular rate and rhythm Lungs: clear to auscultation bilaterally Abdomen: soft, nontender, nondistended, no obvious ascites, no peritoneal signs, normal bowel sounds Extremities: no lower extremity edema bilaterally Skin: no lesions on visible extremities    Assessment and plan: 44 y.o. female with  chronic constipation, intermittent minor rectal bleeding, intermittent right flank pains  Her right sided pains are always improved when she moves her bowels, always worse when she is pushing and straining to move her bowels. Perhaps she does have adhesive disease causing some of the symptoms but  obviously constipation is playing a role. She has tried many over-the-counter remedies and I'm going to call her in a prescription for Linzess 145 g, she'll take 1 pill once daily. We'll proceed with colonoscopy at her soonest convenience to rule out structural causes of her constipation, significant neoplastic problems causing her intermittent rectal bleeding which I think is unlikely. She will also have a basic set of labs today including a CBC and complete metabolic profile.

## 2014-04-07 NOTE — Telephone Encounter (Signed)
Message from Marletta Lor, MD sent at 04/07/2014 2:42 PM EST -----    Notify patient that her liver function studies are still elevated. Ask her to hold simvastatin and repeat LFTs in 6-8 weeks   Spoke to pt, told her liver function studies are still elevated need to hold simvastatin and repeat LFTs in 6-8 weeks per Dr. Raliegh Ip. Pt verbalized understanding. Order for repeat LFT's put in EPIC.

## 2014-04-09 ENCOUNTER — Telehealth: Payer: Self-pay | Admitting: Gastroenterology

## 2014-04-09 NOTE — Telephone Encounter (Signed)
Dr Ardis Hughs the pt says that she can not afford amitiza either, please advise

## 2014-04-10 ENCOUNTER — Encounter: Payer: Self-pay | Admitting: Surgery

## 2014-04-10 NOTE — Telephone Encounter (Signed)
Can you see if there are any programs offered by the drug companies for amitiza, linzess.  If not then she will have to push miralax, fiber supplements.

## 2014-04-10 NOTE — Telephone Encounter (Signed)
The pt will complete a linzess patient assistance form and bring it to the office on Monday for our office to fill out the MD section and try for assistance

## 2014-04-13 ENCOUNTER — Other Ambulatory Visit (INDEPENDENT_AMBULATORY_CARE_PROVIDER_SITE_OTHER): Payer: PRIVATE HEALTH INSURANCE

## 2014-04-13 ENCOUNTER — Ambulatory Visit (HOSPITAL_COMMUNITY)
Admission: RE | Admit: 2014-04-13 | Discharge: 2014-04-13 | Disposition: A | Discharge status: Home / Self Care | Payer: PRIVATE HEALTH INSURANCE | Source: Ambulatory Visit | Attending: Gastroenterology | Admitting: Gastroenterology

## 2014-04-13 DIAGNOSIS — G8929 Other chronic pain: Secondary | ICD-10-CM | POA: Diagnosis not present

## 2014-04-13 DIAGNOSIS — R748 Abnormal levels of other serum enzymes: Secondary | ICD-10-CM

## 2014-04-13 DIAGNOSIS — R109 Unspecified abdominal pain: Secondary | ICD-10-CM

## 2014-04-13 DIAGNOSIS — R1031 Right lower quadrant pain: Secondary | ICD-10-CM | POA: Insufficient documentation

## 2014-04-13 LAB — IGA: IgA: 268 mg/dL (ref 68–378)

## 2014-04-13 LAB — PROTIME-INR
INR: 1.1 ratio — AB (ref 0.8–1.0)
PROTHROMBIN TIME: 11.9 s (ref 9.6–13.1)

## 2014-04-13 LAB — IBC PANEL
Iron: 41 ug/dL — ABNORMAL LOW (ref 42–145)
Saturation Ratios: 10 % — ABNORMAL LOW (ref 20.0–50.0)
Transferrin: 294.1 mg/dL (ref 212.0–360.0)

## 2014-04-14 LAB — HEPATITIS C ANTIBODY: HCV Ab: NEGATIVE

## 2014-04-14 LAB — HEPATITIS B SURFACE ANTIGEN: Hepatitis B Surface Ag: NEGATIVE

## 2014-04-14 LAB — TISSUE TRANSGLUTAMINASE, IGA: Tissue Transglutaminase Ab, IgA: 1 U/mL (ref <4)

## 2014-04-14 LAB — ANA: Anti Nuclear Antibody(ANA): NEGATIVE

## 2014-04-14 LAB — MITOCHONDRIAL ANTIBODIES: Mitochondrial M2 Ab, IgG: 0.32 (ref <0.91)

## 2014-04-14 LAB — HEPATITIS B SURFACE ANTIBODY,QUALITATIVE: Hep B S Ab: NEGATIVE

## 2014-04-14 LAB — HEPATITIS A ANTIBODY, TOTAL: HEP A TOTAL AB: REACTIVE — AB

## 2014-04-15 ENCOUNTER — Telehealth: Payer: Self-pay | Admitting: Gastroenterology

## 2014-04-15 LAB — CERULOPLASMIN: CERULOPLASMIN: 33 mg/dL (ref 18–53)

## 2014-04-15 LAB — ANTI-SMOOTH MUSCLE ANTIBODY, IGG: Smooth Muscle Ab: 10 U (ref <20)

## 2014-04-15 LAB — ALPHA-1-ANTITRYPSIN: A-1 Antitrypsin, Ser: 163 mg/dL (ref 83–199)

## 2014-04-15 NOTE — Telephone Encounter (Signed)
Pt aware labs are not available I will call as soon as resulted

## 2014-04-20 ENCOUNTER — Encounter (HOSPITAL_COMMUNITY): Payer: Self-pay | Admitting: Emergency Medicine

## 2014-04-20 ENCOUNTER — Telehealth: Payer: Self-pay | Admitting: Gastroenterology

## 2014-04-20 ENCOUNTER — Emergency Department (HOSPITAL_COMMUNITY)
Admission: EM | Admit: 2014-04-20 | Discharge: 2014-04-20 | Disposition: A | Discharge status: Home / Self Care | Payer: PRIVATE HEALTH INSURANCE | Attending: Emergency Medicine | Admitting: Emergency Medicine

## 2014-04-20 ENCOUNTER — Emergency Department (HOSPITAL_COMMUNITY): Payer: PRIVATE HEALTH INSURANCE

## 2014-04-20 ENCOUNTER — Telehealth: Payer: Self-pay | Admitting: Internal Medicine

## 2014-04-20 DIAGNOSIS — E785 Hyperlipidemia, unspecified: Secondary | ICD-10-CM | POA: Insufficient documentation

## 2014-04-20 DIAGNOSIS — R1011 Right upper quadrant pain: Secondary | ICD-10-CM

## 2014-04-20 DIAGNOSIS — Z3202 Encounter for pregnancy test, result negative: Secondary | ICD-10-CM | POA: Insufficient documentation

## 2014-04-20 DIAGNOSIS — M545 Low back pain: Secondary | ICD-10-CM | POA: Diagnosis not present

## 2014-04-20 DIAGNOSIS — Z87440 Personal history of urinary (tract) infections: Secondary | ICD-10-CM | POA: Insufficient documentation

## 2014-04-20 DIAGNOSIS — R109 Unspecified abdominal pain: Secondary | ICD-10-CM | POA: Diagnosis present

## 2014-04-20 DIAGNOSIS — Z79899 Other long term (current) drug therapy: Secondary | ICD-10-CM | POA: Diagnosis not present

## 2014-04-20 DIAGNOSIS — M546 Pain in thoracic spine: Secondary | ICD-10-CM | POA: Insufficient documentation

## 2014-04-20 DIAGNOSIS — Z8619 Personal history of other infectious and parasitic diseases: Secondary | ICD-10-CM | POA: Insufficient documentation

## 2014-04-20 DIAGNOSIS — Z872 Personal history of diseases of the skin and subcutaneous tissue: Secondary | ICD-10-CM | POA: Insufficient documentation

## 2014-04-20 DIAGNOSIS — Z8709 Personal history of other diseases of the respiratory system: Secondary | ICD-10-CM | POA: Diagnosis not present

## 2014-04-20 DIAGNOSIS — R945 Abnormal results of liver function studies: Principal | ICD-10-CM

## 2014-04-20 DIAGNOSIS — K5900 Constipation, unspecified: Secondary | ICD-10-CM | POA: Diagnosis not present

## 2014-04-20 DIAGNOSIS — J45909 Unspecified asthma, uncomplicated: Secondary | ICD-10-CM | POA: Diagnosis not present

## 2014-04-20 DIAGNOSIS — F419 Anxiety disorder, unspecified: Secondary | ICD-10-CM | POA: Diagnosis not present

## 2014-04-20 DIAGNOSIS — R7989 Other specified abnormal findings of blood chemistry: Secondary | ICD-10-CM

## 2014-04-20 DIAGNOSIS — F329 Major depressive disorder, single episode, unspecified: Secondary | ICD-10-CM | POA: Diagnosis not present

## 2014-04-20 LAB — CBC WITH DIFFERENTIAL/PLATELET
BASOS PCT: 0 % (ref 0–1)
Basophils Absolute: 0 10*3/uL (ref 0.0–0.1)
EOS PCT: 3 % (ref 0–5)
Eosinophils Absolute: 0.1 10*3/uL (ref 0.0–0.7)
HCT: 38 % (ref 36.0–46.0)
HEMOGLOBIN: 12.1 g/dL (ref 12.0–15.0)
LYMPHS ABS: 1.7 10*3/uL (ref 0.7–4.0)
Lymphocytes Relative: 33 % (ref 12–46)
MCH: 26.5 pg (ref 26.0–34.0)
MCHC: 31.8 g/dL (ref 30.0–36.0)
MCV: 83.2 fL (ref 78.0–100.0)
MONOS PCT: 7 % (ref 3–12)
Monocytes Absolute: 0.4 10*3/uL (ref 0.1–1.0)
NEUTROS PCT: 57 % (ref 43–77)
Neutro Abs: 3 10*3/uL (ref 1.7–7.7)
Platelets: 278 10*3/uL (ref 150–400)
RBC: 4.57 MIL/uL (ref 3.87–5.11)
RDW: 14.7 % (ref 11.5–15.5)
WBC: 5.3 10*3/uL (ref 4.0–10.5)

## 2014-04-20 LAB — COMPREHENSIVE METABOLIC PANEL
ALBUMIN: 4.2 g/dL (ref 3.5–5.2)
ALK PHOS: 50 U/L (ref 39–117)
ALT: 28 U/L (ref 0–35)
ANION GAP: 10 (ref 5–15)
AST: 23 U/L (ref 0–37)
BUN: 15 mg/dL (ref 6–23)
CHLORIDE: 101 meq/L (ref 96–112)
CO2: 27 mmol/L (ref 19–32)
Calcium: 9.3 mg/dL (ref 8.4–10.5)
Creatinine, Ser: 0.61 mg/dL (ref 0.50–1.10)
GFR calc Af Amer: >90 mL/min (ref >90)
Glucose, Bld: 99 mg/dL (ref 70–99)
POTASSIUM: 3.7 mmol/L (ref 3.5–5.1)
Sodium: 138 mmol/L (ref 135–145)
Total Bilirubin: 0.5 mg/dL (ref 0.3–1.2)
Total Protein: 7.6 g/dL (ref 6.0–8.3)

## 2014-04-20 LAB — URINALYSIS, ROUTINE W REFLEX MICROSCOPIC
Bilirubin Urine: NEGATIVE
Glucose, UA: NEGATIVE mg/dL
Ketones, ur: NEGATIVE mg/dL
Leukocytes, UA: NEGATIVE
NITRITE: NEGATIVE
PH: 5.5 (ref 5.0–8.0)
PROTEIN: NEGATIVE mg/dL
SPECIFIC GRAVITY, URINE: 1.026 (ref 1.005–1.030)
Urobilinogen, UA: 0.2 mg/dL (ref 0.0–1.0)

## 2014-04-20 LAB — URINE MICROSCOPIC-ADD ON

## 2014-04-20 LAB — PREGNANCY, URINE: PREG TEST UR: NEGATIVE

## 2014-04-20 LAB — LIPASE, BLOOD: Lipase: 35 U/L (ref 11–59)

## 2014-04-20 MED ORDER — FLUTICASONE PROPIONATE 50 MCG/ACT NA SUSP: 2.0000 | Freq: Every day | NASAL | Status: DC

## 2014-04-20 MED ORDER — SODIUM CHLORIDE 0.9 % IV BOLUS (SEPSIS)
1000.0000 mL | Freq: Once | INTRAVENOUS | Status: AC
  Administered 2014-04-20: 1000 mL via INTRAVENOUS

## 2014-04-20 MED ORDER — ONDANSETRON HCL 4 MG/2ML IJ SOLN
4.0000 mg | Freq: Once | INTRAMUSCULAR | Status: AC
  Administered 2014-04-20: 4 mg via INTRAVENOUS
  Filled 2014-04-20: qty 2

## 2014-04-20 MED ORDER — MORPHINE SULFATE 4 MG/ML IJ SOLN
4.0000 mg | Freq: Once | INTRAMUSCULAR | Status: AC
  Administered 2014-04-20: 4 mg via INTRAVENOUS
  Filled 2014-04-20: qty 1

## 2014-04-20 NOTE — Telephone Encounter (Signed)
Continue Zyrtec Add fluticasone nasal spray

## 2014-04-20 NOTE — Discharge Instructions (Signed)
Abdominal Pain °Many things can cause abdominal pain. Usually, abdominal pain is not caused by a disease and will improve without treatment. It can often be observed and treated at home. Your health care provider will do a physical exam and possibly order blood tests and X-rays to help determine the seriousness of your pain. However, in many cases, more time must pass before a clear cause of the pain can be found. Before that point, your health care provider may not know if you need more testing or further treatment. °HOME CARE INSTRUCTIONS  °Monitor your abdominal pain for any changes. The following actions may help to alleviate any discomfort you are experiencing: °· Only take over-the-counter or prescription medicines as directed by your health care provider. °· Do not take laxatives unless directed to do so by your health care provider. °· Try a clear liquid diet (broth, tea, or water) as directed by your health care provider. Slowly move to a bland diet as tolerated. °SEEK MEDICAL CARE IF: °· You have unexplained abdominal pain. °· You have abdominal pain associated with nausea or diarrhea. °· You have pain when you urinate or have a bowel movement. °· You experience abdominal pain that wakes you in the night. °· You have abdominal pain that is worsened or improved by eating food. °· You have abdominal pain that is worsened with eating fatty foods. °· You have a fever. °SEEK IMMEDIATE MEDICAL CARE IF:  °· Your pain does not go away within 2 hours. °· You keep throwing up (vomiting). °· Your pain is felt only in portions of the abdomen, such as the right side or the left lower portion of the abdomen. °· You pass bloody or black tarry stools. °MAKE SURE YOU: °· Understand these instructions.   °· Will watch your condition.   °· Will get help right away if you are not doing well or get worse.   °Document Released: 12/28/2004 Document Revised: 03/25/2013 Document Reviewed: 11/27/2012 °ExitCare® Patient Information  ©2015 [employer]. This information is not intended to replace advice given to you by your health care provider. Make sure you discuss any questions you have with your health care provider. ° °Constipation °Constipation is when a person has fewer than three bowel movements a week, has difficulty having a bowel movement, or has stools that are dry, hard, or larger than normal. As people grow older, constipation is more common. If you try to fix constipation with medicines that make you have a bowel movement (laxatives), the problem may get worse. Long-term laxative use may cause the muscles of the colon to become weak. A low-fiber diet, not taking in enough fluids, and taking certain medicines may make constipation worse.  °CAUSES  °· Certain medicines, such as antidepressants, pain medicine, iron supplements, antacids, and water pills.   °· Certain diseases, such as diabetes, irritable bowel syndrome (IBS), thyroid disease, or depression.   °· Not drinking enough water.   °· Not eating enough fiber-rich foods.   °· Stress or travel.   °· Lack of physical activity or exercise.   °· Ignoring the urge to have a bowel movement.   °· Using laxatives too much.   °SIGNS AND SYMPTOMS  °· Having fewer than three bowel movements a week.   °· Straining to have a bowel movement.   °· Having stools that are hard, dry, or larger than normal.   °· Feeling full or bloated.   °· Pain in the lower abdomen.   °· Not feeling relief after having a bowel movement.   °DIAGNOSIS  °Your health care provider will take   a medical history and perform a physical exam. Further testing may be done for severe constipation. Some tests may include: °· A barium enema X-ray to examine your rectum, colon, and, sometimes, your small intestine.   °· A sigmoidoscopy to examine your lower colon.   °· A colonoscopy to examine your entire colon. °TREATMENT  °Treatment will depend on the severity of your constipation and what is causing it. Some dietary  treatments include drinking more fluids and eating more fiber-rich foods. Lifestyle treatments may include regular exercise. If these diet and lifestyle recommendations do not help, your health care provider may recommend taking over-the-counter laxative medicines to help you have bowel movements. Prescription medicines may be prescribed if over-the-counter medicines do not work.  °HOME CARE INSTRUCTIONS  °· Eat foods that have a lot of fiber, such as fruits, vegetables, whole grains, and beans. °· Limit foods high in fat and processed sugars, such as french fries, hamburgers, cookies, candies, and soda.   °· A fiber supplement may be added to your diet if you cannot get enough fiber from foods.   °· Drink enough fluids to keep your urine clear or pale yellow.   °· Exercise regularly or as directed by your health care provider.   °· Go to the restroom when you have the urge to go. Do not hold it.   °· Only take over-the-counter or prescription medicines as directed by your health care provider. Do not take other medicines for constipation without talking to your health care provider first.   °SEEK IMMEDIATE MEDICAL CARE IF:  °· You have bright red blood in your stool.   °· Your constipation lasts for more than 4 days or gets worse.   °· You have abdominal or rectal pain.   °· You have thin, pencil-like stools.   °· You have unexplained weight loss. °MAKE SURE YOU:  °· Understand these instructions. °· Will watch your condition. °· Will get help right away if you are not doing well or get worse. °Document Released: 12/17/2003 Document Revised: 03/25/2013 Document Reviewed: 12/30/2012 °ExitCare® Patient Information ©2015 [employer]. This information is not intended to replace advice given to you by your health care provider. Make sure you discuss any questions you have with your health care provider. ° °

## 2014-04-20 NOTE — Telephone Encounter (Signed)
Pt is having nasal drainage and his tried mucinex dm and zyrtec not relief. [facility] on [address]. Pt was in er [place] and was discharge today and they did not give her anything for nasal drainage

## 2014-04-20 NOTE — Telephone Encounter (Signed)
Dr Ardis Hughs have you reviewed the labs from 04/13/14?  It looks like the notes and labs from previously on 04/07/14 were linked. You may not have reviewed.  I routed it to you this morning.

## 2014-04-20 NOTE — ED Provider Notes (Signed)
CSN: [id number]     Arrival date & time 04/20/14  0447 History   First MD Initiated Contact with Patient 04/20/14 [phone]     Chief Complaint  Patient presents with  . Constipation  . Abdominal Pain     (Consider location/radiation/quality/duration/timing/severity/associated sxs/prior Treatment) HPI Patient presents with right sided abdominal pain that started yesterday evening around 11:30 PM. She's had some nausea with one episode of vomiting. Denies any fever or chills. Last ate around 9 PM. Patient has chronic constipation and was recently seen by Dr. Ardis Hughs. She had labs done that time which showed mild elevation in her LFTs. She had an ultrasound demonstrated cholelithiasis without evidence of cholecystitis. Dr. Ardis Hughs thought her symptoms more likely a result of constipation. Prescribed to medications for her constipation which the patient has not filled. She tried multiple other medications in the past without relief. Past Medical History  Diagnosis Date  . Asthma   . Genital warts   . Bronchitis   . UTI (lower urinary tract infection)   . Anxiety   . Depression   . Hyperlipemia   . Acne   . Irritable bowel syndrome    Past Surgical History  Procedure Laterality Date  . Cesarean section    . Tubal ligation     History reviewed. No pertinent family history. History  Substance Use Topics  . Smoking status: Never Smoker   . Smokeless tobacco: Never Used  . Alcohol Use: No   OB History    Gravida Para Term Preterm AB TAB SAB Ectopic Multiple Living   '2 2 1 1      2     ' Review of Systems  Constitutional: Negative for fever and chills.  Gastrointestinal: Positive for nausea, vomiting, abdominal pain and constipation. Negative for blood in stool.  Genitourinary: Negative for dysuria, frequency and pelvic pain.  Musculoskeletal: Positive for back pain. Negative for myalgias, neck pain and neck stiffness.  Neurological: Negative for dizziness, weakness, light-headedness,  numbness and headaches.  All other systems reviewed and are negative.     Allergies  Review of patient's allergies indicates no known allergies.  Home Medications   Prior to Admission medications   Medication Sig Start Date End Date Taking? Authorizing Provider  Biotin 300 MCG TABS Take 300 mcg by mouth daily.   Yes Historical Provider, MD  cetirizine (ZYRTEC) 10 MG tablet Take 10 mg by mouth daily.   Yes Historical Provider, MD  Dextromethorphan-Guaifenesin 5-100 MG/5ML LIQD Take 5 mLs by mouth every 6 (six) hours as needed (cough).   Yes Historical Provider, MD  famotidine (PEPCID) 20 MG tablet Take 1 tablet (20 mg total) by mouth 2 (two) times daily. While taking medrol. May discontinue when Medrol finished Patient taking differently: Take 20 mg by mouth 2 (two) times daily as needed for heartburn. While taking medrol. May discontinue when Medrol finished 03/21/14  Yes Audelia Hives Presson, PA  ibuprofen (ADVIL,MOTRIN) 200 MG tablet Take 400 mg by mouth every 6 (six) hours as needed for moderate pain.   Yes Historical Provider, MD  Multiple Vitamin (MULTIVITAMIN WITH MINERALS) TABS Take 1 tablet by mouth daily.   Yes Historical Provider, MD  naphazoline-pheniramine (NAPHCON-A) 0.025-0.3 % ophthalmic solution Place 1 drop into both eyes 4 (four) times daily as needed for irritation.   Yes Historical Provider, MD  Omega-3 Krill Oil 300 MG CAPS Take 300 mg by mouth daily.    Yes Historical Provider, MD  OVER THE COUNTER MEDICATION Take 1  tablet by mouth 2 (two) times daily. viviscal professional hair growth   Yes Historical Provider, MD  pseudoephedrine-acetaminophen (TYLENOL SINUS) 30-500 MG TABS Take 1 tablet by mouth every 4 (four) hours as needed (cough).   Yes Historical Provider, MD  sertraline (ZOLOFT) 100 MG tablet Take 2 tablets (200 mg total) by mouth daily. 11/11/13  Yes Marletta Lor, MD  fluconazole (DIFLUCAN) 150 MG tablet Take 1 tablet (150 mg total) by mouth  once. Patient not taking: Reported on 04/20/2014 11/18/13   Marletta Lor, MD  Linaclotide [facility]) 145 MCG CAPS capsule Take 1 capsule (145 mcg total) by mouth daily. 04/07/14   Milus Banister, MD  LORazepam (ATIVAN) 0.5 MG tablet Take 1 tablet (0.5 mg total) by mouth 2 (two) times daily as needed for anxiety. Patient not taking: Reported on 04/20/2014 11/03/13   Marletta Lor, MD  lubiprostone (AMITIZA) 8 MCG capsule Take 1 capsule (8 mcg total) by mouth 2 (two) times daily with a meal. 04/07/14   Milus Banister, MD  MOVIPREP 100 G SOLR Take 1 kit (200 g total) by mouth once. 04/07/14   Milus Banister, MD  simvastatin (ZOCOR) 40 MG tablet Take 1 tablet (40 mg total) by mouth daily. Patient not taking: Reported on 04/20/2014 11/11/13   Marletta Lor, MD   BP 135/76 mmHg  Pulse 99  Temp(Src) 98.3 F (36.8 C) (Oral)  Resp 18  Ht '5\' 3"'  (1.6 m)  Wt 159 lb (72.122 kg)  BMI 28.17 kg/m2  SpO2 100%  LMP 04/03/2014 (Approximate) Physical Exam  Constitutional: She is oriented to person, place, and time. She appears well-developed and well-nourished. No distress.  HENT:  Head: Normocephalic and atraumatic.  Mouth/Throat: Oropharynx is clear and moist.  Eyes: EOM are normal. Pupils are equal, round, and reactive to light.  Neck: Normal range of motion. Neck supple.  Cardiovascular: Normal rate and regular rhythm.   Pulmonary/Chest: Effort normal and breath sounds normal. No respiratory distress. She has no wheezes. She has no rales.  Abdominal: Soft. Bowel sounds are normal. She exhibits distension. She exhibits no mass. There is tenderness (Mild tenderness to palpation diffusely but more pronounced in the right upper quadrant. There is no rebound or guarding. No evidence of any trauma.). There is no rebound and no guarding.  Musculoskeletal: Normal range of motion. She exhibits tenderness (patient does have mild right inferior thoracic and superior lumbar tenderness with palpation. No  midline tenderness. No obvious trauma.). She exhibits no edema.  Neurological: She is alert and oriented to person, place, and time.  Moves all extremities without deficit. Sensation intact.  Skin: Skin is warm and dry. No rash noted. No erythema.  Psychiatric: Her behavior is normal.  Flat affect  Nursing note and vitals reviewed.   ED Course  Procedures (including critical care time) Labs Review Labs Reviewed  CBC WITH DIFFERENTIAL  COMPREHENSIVE METABOLIC PANEL  LIPASE, BLOOD  URINALYSIS, ROUTINE W REFLEX MICROSCOPIC  PREGNANCY, URINE    Imaging Review No results found.   EKG Interpretation None      MDM   Final diagnoses:  RUQ pain   Signed out to oncoming emergency physician pending ultrasound of the abdomen.     Julianne Rice, MD 04/23/14 [id number]

## 2014-04-20 NOTE — ED Notes (Signed)
Pt arrived to the ED with a complaint of abdominal pain and constipation.  Pt hit her right mid stomach area last night on a dresser and it has caused pain that has not subsided.  Pt took ibuprofen without any relief.   Pt states she has a GI doctor and that they are aware of situation.  Pt was told she had gallstones as a result of a CT scan.

## 2014-04-20 NOTE — Telephone Encounter (Signed)
Please see message and advise 

## 2014-04-20 NOTE — Telephone Encounter (Signed)
Spoke to pt, told her to continue Zyrtec and I am sending a Rx to the pharmacy for Flonase nasal spray per Dr. Raliegh Ip. Pt verbalized understanding. Rx sent to pharmacy.

## 2014-04-20 NOTE — ED Notes (Signed)
US at bedside

## 2014-04-21 ENCOUNTER — Telehealth: Payer: Self-pay | Admitting: Gastroenterology

## 2014-04-22 NOTE — Telephone Encounter (Signed)
Pt callled because she had questions about her labs, she saw that her Hep A was reactive on My Chart.  I discussed with Janett Billow who looked at her LFT's  and they are normal.  Janett Billow states I can send to Dr Ardis Hughs on Monday, not urgent.  Pt is aware

## 2014-04-26 NOTE — Telephone Encounter (Signed)
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  To be certain, lets have her get Hep A IgM antibody test this week.  Should otherwise continue with the suggestions outlined at recent visit.

## 2014-04-27 NOTE — Telephone Encounter (Signed)
Pt aware and will have labs

## 2014-05-04 ENCOUNTER — Other Ambulatory Visit: Payer: PRIVATE HEALTH INSURANCE

## 2014-05-04 LAB — HEPATITIS A ANTIBODY, IGM: HEP A IGM: NONREACTIVE

## 2014-05-05 ENCOUNTER — Telehealth: Payer: Self-pay | Admitting: Gastroenterology

## 2014-05-05 MED ORDER — LINACLOTIDE 145 MCG PO CAPS: 145.0000 ug | ORAL_CAPSULE | Freq: Every day | ORAL | Status: DC

## 2014-05-05 NOTE — Telephone Encounter (Signed)
Pt aware rx has been sent [facility] prep voucher left at front desk.

## 2014-05-05 NOTE — Telephone Encounter (Signed)
Left message on machine to call back  

## 2014-05-12 ENCOUNTER — Encounter: Payer: Self-pay | Admitting: Gastroenterology

## 2014-05-12 ENCOUNTER — Ambulatory Visit (AMBULATORY_SURGERY_CENTER): Payer: PRIVATE HEALTH INSURANCE | Admitting: Gastroenterology

## 2014-05-12 VITALS — BP 127/70 | HR 72 | Temp 98.7°F | Resp 13 | Ht 61.75 in | Wt 160.0 lb

## 2014-05-12 DIAGNOSIS — R109 Unspecified abdominal pain: Secondary | ICD-10-CM

## 2014-05-12 DIAGNOSIS — K5900 Constipation, unspecified: Secondary | ICD-10-CM

## 2014-05-12 MED ORDER — SODIUM CHLORIDE 0.9 % IV SOLN: 500.0000 mL | INTRAVENOUS | Status: DC

## 2014-05-12 NOTE — Patient Instructions (Signed)
Discharge instructions given. Normal exam. Resume previous medications. YOU HAD AN ENDOSCOPIC PROCEDURE TODAY AT [facility]: Refer to the procedure report that was given to you for any specific questions about what was found during the examination.  If the procedure report does not answer your questions, please call your gastroenterologist to clarify.  If you requested that your care partner not be given the details of your procedure findings, then the procedure report has been included in a sealed envelope for you to review at your convenience later.  YOU SHOULD EXPECT: Some feelings of bloating in the abdomen. Passage of more gas than usual.  Walking can help get rid of the air that was put into your GI tract during the procedure and reduce the bloating. If you had a lower endoscopy (such as a colonoscopy or flexible sigmoidoscopy) you may notice spotting of blood in your stool or on the toilet paper. If you underwent a bowel prep for your procedure, then you may not have a normal bowel movement for a few days.  DIET: Your first meal following the procedure should be a light meal and then it is ok to progress to your normal diet.  A half-sandwich or bowl of soup is an example of a good first meal.  Heavy or fried foods are harder to digest and may make you feel nauseous or bloated.  Likewise meals heavy in dairy and vegetables can cause extra gas to form and this can also increase the bloating.  Drink plenty of fluids but you should avoid alcoholic beverages for 24 hours.  ACTIVITY: Your care partner should take you home directly after the procedure.  You should plan to take it easy, moving slowly for the rest of the day.  You can resume normal activity the day after the procedure however you should NOT DRIVE or use heavy machinery for 24 hours (because of the sedation medicines used during the test).    SYMPTOMS TO REPORT IMMEDIATELY: A gastroenterologist can be reached at any hour.   During normal business hours, 8:30 AM to 5:00 PM Monday through Friday, call [phone].  After hours and on weekends, please call the GI answering service at [phone] who will take a message and have the physician on call contact you.   Following lower endoscopy (colonoscopy or flexible sigmoidoscopy):  Excessive amounts of blood in the stool  Significant tenderness or worsening of abdominal pains  Swelling of the abdomen that is new, acute  Fever of 100F or higher  FOLLOW UP: If any biopsies were taken you will be contacted by phone or by letter within the next 1-3 weeks.  Call your gastroenterologist if you have not heard about the biopsies in 3 weeks.  Our staff will call the home number listed on your records the next business day following your procedure to check on you and address any questions or concerns that you may have at that time regarding the information given to you following your procedure. This is a courtesy call and so if there is no answer at the home number and we have not heard from you through the emergency physician on call, we will assume that you have returned to your regular daily activities without incident.  SIGNATURES/CONFIDENTIALITY: You and/or your care partner have signed paperwork which will be entered into your electronic medical record.  These signatures attest to the fact that that the information above on your After Visit Summary has been reviewed and is understood.  Full responsibility of the confidentiality of this discharge information lies with you and/or your care-partner.

## 2014-05-12 NOTE — Op Note (Signed)
[facility]  [address] [place], [id number]   COLONOSCOPY PROCEDURE REPORT  PATIENT: Alexis [name], Alexis [name]  MR#: [id number] BIRTHDATE: 03/01/1971 , 43  yrs. old GENDER: female ENDOSCOPIST: Milus Banister, MD REFERRED OI:[id number] Burnice Logan, M.D. PROCEDURE DATE:  05/12/2014 PROCEDURE:   Colonoscopy, diagnostic First Screening Colonoscopy - Avg.  risk and is 50 yrs.  old or older - No.  Prior Negative Screening - Now for repeat screening. N/A  History of Adenoma - Now for follow-up colonoscopy & has been > or = to 3 yrs.  N/A  Polyps Removed Today? No.  Recommend repeat exam, <10 yrs? No. ASA CLASS:   Class II INDICATIONS:Constipation, chronic.  Intermittent right flank pains that almost always improve with BM. MEDICATIONS: Monitored anesthesia care and Propofol 300 mg IV  DESCRIPTION OF PROCEDURE:   After the risks benefits and alternatives of the procedure were thoroughly explained, informed consent was obtained.  The digital rectal exam revealed no abnormalities of the rectum.   The LB [id number]  endoscope was introduced through the anus and advanced to the cecum, which was identified by both the appendix and ileocecal valve. No adverse events experienced.   The quality of the prep was excellent.  The instrument was then slowly withdrawn as the colon was fully examined.    COLON FINDINGS: A normal appearing cecum, ileocecal valve, and appendiceal orifice were identified.  The ascending, transverse, descending, sigmoid colon, and rectum appeared unremarkable. Retroflexed views revealed no abnormalities. The time to cecum=1 minutes 44 seconds.  Withdrawal time=5 minutes 40 seconds.  The scope was withdrawn and the procedure completed. COMPLICATIONS: There were no immediate complications.  ENDOSCOPIC IMPRESSION: Normal colonoscopy  RECOMMENDATIONS: My office will contact you about linzess/amitiza assistence programs.  eSigned:  Milus Banister,  MD 05/12/2014 3:53 PM

## 2014-05-12 NOTE — Progress Notes (Signed)
Report to PACU, RN, vss, BBS= Clear.  

## 2014-05-13 ENCOUNTER — Telehealth: Payer: Self-pay | Admitting: Internal Medicine

## 2014-05-13 ENCOUNTER — Telehealth: Payer: Self-pay | Admitting: *Deleted

## 2014-05-13 ENCOUNTER — Telehealth: Payer: Self-pay

## 2014-05-13 DIAGNOSIS — R5383 Other fatigue: Secondary | ICD-10-CM

## 2014-05-13 NOTE — Telephone Encounter (Signed)
Pt had procedure w/ dr Ardis Hughs and mentioned how tired she has been lately.  He advised her to call pcp and get possible b12 testing. Pt would like to have this done. pls advise.

## 2014-05-13 NOTE — Telephone Encounter (Signed)
-----   Message from Milus Banister, MD sent at 05/12/2014  3:53 PM EST ----- I would like to start her on either linzess or amitiza. She tells me both will cost too much out of pocket.  Can you check with drug makers for any information on assistence programs and let her know about what you hear.    Thanks

## 2014-05-13 NOTE — Telephone Encounter (Signed)
The pt has filled out all available patient assistance forms and is not eligible for any programs.  She will continue to research and call if she has any questions

## 2014-05-13 NOTE — Telephone Encounter (Signed)
  Follow up Call-  Call back number 05/12/2014  Post procedure Call Back phone  # [phone]  Permission to leave phone message Yes     Patient questions:  Do you have a fever, pain , or abdominal swelling? No. Pain Score  0 *  Have you tolerated food without any problems? Yes.    Have you been able to return to your normal activities? Yes.    Do you have any questions about your discharge instructions: Diet   No. Medications  No. Follow up visit  No.  Do you have questions or concerns about your Care? No.  Actions: * If pain score is 4 or above: No action needed, pain <4.  Patient states she is "tired", encouraged push fluids and eat good today. Pt to call us back if needed.

## 2014-05-14 NOTE — Telephone Encounter (Signed)
Please see message and advise 

## 2014-05-14 NOTE — Telephone Encounter (Signed)
Pt is calling back would like to ask can she have estrogen, all vitamin levels

## 2014-05-14 NOTE — Telephone Encounter (Signed)
Ok to do B12 and [facility]

## 2014-05-15 ENCOUNTER — Other Ambulatory Visit: Payer: Self-pay | Admitting: Internal Medicine

## 2014-05-15 NOTE — Telephone Encounter (Signed)
Spoke to pt, told her Dr. Raliegh Ip said it was okay to have Vit B 12 and [place] checked. Orders are in, you just need to call back and schedule lab appointment. Pt verbalized understanding.

## 2014-05-18 ENCOUNTER — Other Ambulatory Visit (INDEPENDENT_AMBULATORY_CARE_PROVIDER_SITE_OTHER): Payer: PRIVATE HEALTH INSURANCE

## 2014-05-18 DIAGNOSIS — R5383 Other fatigue: Secondary | ICD-10-CM

## 2014-05-18 DIAGNOSIS — R7989 Other specified abnormal findings of blood chemistry: Secondary | ICD-10-CM

## 2014-05-18 DIAGNOSIS — R945 Abnormal results of liver function studies: Secondary | ICD-10-CM

## 2014-05-18 LAB — HEPATIC FUNCTION PANEL
ALK PHOS: 52 U/L (ref 39–117)
ALT: 19 U/L (ref 0–35)
AST: 16 U/L (ref 0–37)
Albumin: 4.2 g/dL (ref 3.5–5.2)
BILIRUBIN DIRECT: 0 mg/dL (ref 0.0–0.3)
Total Bilirubin: 0.4 mg/dL (ref 0.2–1.2)
Total Protein: 7.4 g/dL (ref 6.0–8.3)

## 2014-05-18 LAB — FOLLICLE STIMULATING HORMONE: FSH: 3.3 m[IU]/mL

## 2014-05-18 LAB — VITAMIN B12: Vitamin B-12: 281 pg/mL (ref 211–911)

## 2014-05-29 ENCOUNTER — Ambulatory Visit (INDEPENDENT_AMBULATORY_CARE_PROVIDER_SITE_OTHER): Payer: PRIVATE HEALTH INSURANCE | Admitting: Family Medicine

## 2014-05-29 ENCOUNTER — Encounter: Payer: Self-pay | Admitting: Family Medicine

## 2014-05-29 VITALS — BP 100/72 | HR 87 | Temp 98.4°F | Ht 61.75 in | Wt 155.5 lb

## 2014-05-29 DIAGNOSIS — R591 Generalized enlarged lymph nodes: Secondary | ICD-10-CM

## 2014-05-29 MED ORDER — FLUCONAZOLE 150 MG PO TABS: 150.0000 mg | ORAL_TABLET | Freq: Once | ORAL | Status: DC

## 2014-05-29 MED ORDER — AMOXICILLIN 875 MG PO TABS: 875.0000 mg | ORAL_TABLET | Freq: Two times a day (BID) | ORAL | Status: DC

## 2014-05-29 NOTE — Progress Notes (Signed)
Pre visit review using our clinic review tool, if applicable. No additional management support is needed unless otherwise documented below in the visit note. 

## 2014-05-29 NOTE — Patient Instructions (Signed)
Schedule follow up with Dr. Burnice Logan in 1-2 weeks  As we discussed, we have prescribed a new medication for you at this appointment. We discussed the common and serious potential adverse effects of this medication and you can review these and more with the pharmacist when you pick up your medication.  Please follow the instructions for use carefully and notify us immediately if you have any problems taking this medication.

## 2014-05-29 NOTE — Progress Notes (Signed)
HPI:  Neck Swelling: -started: tender swelling under L chin for 2 days, reports told was a lymph node by ER doc (friend), sore throat -denies:fever, SOB, NVD, tooth pain, dysphagia, trouble breathing, tooth issues -has tried: ibuprofen -sick contacts/travel/risks: denies flu exposure, tick exposure or or Ebola risks, no recent travel  ROS: See pertinent positives and negatives per HPI.  Past Medical History  Diagnosis Date  . Asthma   . Genital warts   . Bronchitis   . UTI (lower urinary tract infection)   . Anxiety   . Depression   . Hyperlipemia   . Acne   . Irritable bowel syndrome   . Allergy     SEASONAL  . Anemia   . GERD (gastroesophageal reflux disease)     Past Surgical History  Procedure Laterality Date  . Cesarean section    . Tubal ligation    . Laproscopic      2012    No family history on file.  History   Social History  . Marital Status: Divorced    Spouse Name: N/A  . Number of Children: 2  . Years of Education: N/A   Occupational History  . [employer]    Social History Main Topics  . Smoking status: Never Smoker   . Smokeless tobacco: Never Used  . Alcohol Use: No  . Drug Use: No  . Sexual Activity: Yes    Birth Control/ Protection: Surgical   Other Topics Concern  . None   Social History Narrative     Current outpatient prescriptions:  .  Biotin 300 MCG TABS, Take 300 mcg by mouth daily., Disp: , Rfl:  .  cetirizine (ZYRTEC) 10 MG tablet, Take 10 mg by mouth daily., Disp: , Rfl:  .  fluconazole (DIFLUCAN) 150 MG tablet, Take 1 tablet (150 mg total) by mouth once., Disp: 2 tablet, Rfl: 0 .  fluticasone (FLONASE) 50 MCG/ACT nasal spray, Place 2 sprays into both nostrils daily., Disp: 16 g, Rfl: 2 .  ibuprofen (ADVIL,MOTRIN) 200 MG tablet, Take 400 mg by mouth every 6 (six) hours as needed for moderate pain., Disp: , Rfl:  .  Linaclotide (LINZESS) 145 MCG CAPS capsule, Take 1 capsule (145 mcg total) by mouth daily., Disp: 30  capsule, Rfl: 11 .  LORazepam (ATIVAN) 0.5 MG tablet, Take 1 tablet (0.5 mg total) by mouth 2 (two) times daily as needed for anxiety., Disp: 60 tablet, Rfl: 1 .  Multiple Vitamin (MULTIVITAMIN WITH MINERALS) TABS, Take 1 tablet by mouth daily., Disp: , Rfl:  .  naphazoline-pheniramine (NAPHCON-A) 0.025-0.3 % ophthalmic solution, Place 1 drop into both eyes 4 (four) times daily as needed for irritation., Disp: , Rfl:  .  Omega-3 Krill Oil 300 MG CAPS, Take 300 mg by mouth daily. , Disp: , Rfl:  .  OVER THE COUNTER MEDICATION, Take 1 tablet by mouth 2 (two) times daily. viviscal professional hair growth, Disp: , Rfl:  .  sertraline (ZOLOFT) 100 MG tablet, TAKE TWO TABLETS BY MOUTH ONCE DAILY, Disp: 180 tablet, Rfl: 1  EXAM:  Filed Vitals:   05/29/14 1600  BP: 100/72  Pulse: 87  Temp: 98.4 F (36.9 C)    Body mass index is 28.69 kg/(m^2).  GENERAL: vitals reviewed and listed above, alert, oriented, appears well hydrated and in no acute distress  HEENT: atraumatic, conjunttiva clear, no obvious abnormalities on inspection of external nose and ears, normal appearance of ear canals and TMs, clear nasal congestion, mild post oropharyngeal  erythema , no tonsillar edema or exudate, no sinus TTP  NECK: no obvious masses on inspection except for tender, mobile, nonfluctuant 1.5cm mass L ant in region of upper ant cervical lymph chain   LUNGS: clear to auscultation bilaterally, no wheezes, rales or rhonchi, good air movement  CV: HRRR, no peripheral edema  MS: moves all extremities without noticeable abnormality  PSYCH: pleasant and cooperative, no obvious depression or anxiety  ASSESSMENT AND PLAN:  Discussed the following assessment and plan:  No diagnosis found.  -We discussed potential etiologies, with viruses being common causes, bacterial and other causes of acute, tender, ant cervical LAD.  -she wants to try trial of abx with close follow up and return precautions discussed. ENT  eval if worsening or persists. She requests diflucan as reports always needs this for yeast inf when takes abx. -of course, we advised to return or notify a doctor immediately if symptoms worsen or persist or new concerns arise.    There are no Patient Instructions on file for this visit.   Colin Benton R.

## 2014-06-04 ENCOUNTER — Telehealth: Payer: Self-pay | Admitting: Gastroenterology

## 2014-06-04 NOTE — Telephone Encounter (Signed)
Pt is aware that the Linzess is at the front desk for her to pick up

## 2014-06-05 ENCOUNTER — Encounter: Payer: Self-pay | Admitting: Family Medicine

## 2014-06-05 ENCOUNTER — Ambulatory Visit (INDEPENDENT_AMBULATORY_CARE_PROVIDER_SITE_OTHER): Payer: PRIVATE HEALTH INSURANCE | Admitting: Family Medicine

## 2014-06-05 VITALS — BP 120/80 | HR 68 | Temp 98.1°F | Ht 61.75 in | Wt 154.0 lb

## 2014-06-05 DIAGNOSIS — J309 Allergic rhinitis, unspecified: Secondary | ICD-10-CM

## 2014-06-05 DIAGNOSIS — H6592 Unspecified nonsuppurative otitis media, left ear: Secondary | ICD-10-CM

## 2014-06-05 DIAGNOSIS — I889 Nonspecific lymphadenitis, unspecified: Secondary | ICD-10-CM

## 2014-06-05 MED ORDER — MOMETASONE FUROATE 50 MCG/ACT NA SUSP: 2.0000 | Freq: Every day | NASAL | Status: DC

## 2014-06-05 NOTE — Progress Notes (Signed)
HPI:  Alexis [name] is a 44 yo pt of Dr. Burnice Logan here for acute visit for:  LAD: -reports much better on abx, has not finished abx but is takaing -reports right after last visit developed sinus congestion, PND, L ear pain, sinus pain and this is improving with the abx as well -denies: fevers, chills, SOB, dysphagia, malaise, persistent sinus pain -reports a hx of allergies and tried flonase in the past and did not like it   ROS: See pertinent positives and negatives per HPI.  Past Medical History  Diagnosis Date  . Asthma   . Genital warts   . Bronchitis   . UTI (lower urinary tract infection)   . Anxiety   . Depression   . Hyperlipemia   . Acne   . Irritable bowel syndrome   . Allergy     SEASONAL  . Anemia   . GERD (gastroesophageal reflux disease)     Past Surgical History  Procedure Laterality Date  . Cesarean section    . Tubal ligation    . Laproscopic      2012    No family history on file.  History   Social History  . Marital Status: Divorced    Spouse Name: N/A  . Number of Children: 2  . Years of Education: N/A   Occupational History  . [employer]    Social History Main Topics  . Smoking status: Never Smoker   . Smokeless tobacco: Never Used  . Alcohol Use: No  . Drug Use: No  . Sexual Activity: Yes    Birth Control/ Protection: Surgical   Other Topics Concern  . None   Social History Narrative     Current outpatient prescriptions:  .  Biotin 300 MCG TABS, Take 300 mcg by mouth daily., Disp: , Rfl:  .  cetirizine (ZYRTEC) 10 MG tablet, Take 10 mg by mouth daily., Disp: , Rfl:  .  fluticasone (FLONASE) 50 MCG/ACT nasal spray, Place 2 sprays into both nostrils daily., Disp: 16 g, Rfl: 2 .  ibuprofen (ADVIL,MOTRIN) 200 MG tablet, Take 400 mg by mouth every 6 (six) hours as needed for moderate pain., Disp: , Rfl:  .  Linaclotide (LINZESS) 145 MCG CAPS capsule, Take 1 capsule (145 mcg total) by mouth daily., Disp: 30 capsule,  Rfl: 11 .  LORazepam (ATIVAN) 0.5 MG tablet, Take 1 tablet (0.5 mg total) by mouth 2 (two) times daily as needed for anxiety., Disp: 60 tablet, Rfl: 1 .  Multiple Vitamin (MULTIVITAMIN WITH MINERALS) TABS, Take 1 tablet by mouth daily., Disp: , Rfl:  .  naphazoline-pheniramine (NAPHCON-A) 0.025-0.3 % ophthalmic solution, Place 1 drop into both eyes 4 (four) times daily as needed for irritation., Disp: , Rfl:  .  Omega-3 Krill Oil 300 MG CAPS, Take 300 mg by mouth daily. , Disp: , Rfl:  .  OVER THE COUNTER MEDICATION, Take 1 tablet by mouth 2 (two) times daily. viviscal professional hair growth, Disp: , Rfl:  .  sertraline (ZOLOFT) 100 MG tablet, TAKE TWO TABLETS BY MOUTH ONCE DAILY, Disp: 180 tablet, Rfl: 1 .  mometasone (NASONEX) 50 MCG/ACT nasal spray, Place 2 sprays into the nose daily., Disp: 17 g, Rfl: 0  EXAM:  Filed Vitals:   06/05/14 0919  BP: 120/80  Pulse: 68  Temp: 98.1 F (36.7 C)    Body mass index is 28.41 kg/(m^2).  GENERAL: vitals reviewed and listed above, alert, oriented, appears well hydrated and in no acute distress  HEENT:  atraumatic, conjunttiva clear, no obvious abnormalities on inspection of external nose and ears, normal appearance of ear canals and TMs except for clear effusion L, clear nasal congestion, mild post oropharyngeal erythema with PND, no tonsillar edema or exudate, no sinus TTP  NECK: bilat shotty ant cervical tender LAD  LUNGS: clear to auscultation bilaterally, no wheezes, rales or rhonchi, good air movement  CV: HRRR, no peripheral edema  MS: moves all extremities without noticeable abnormality  PSYCH: pleasant and cooperative, no obvious depression or anxiety  ASSESSMENT AND PLAN:  Discussed the following assessment and plan:  Lymphadenitis -improved likely 2ndary to upper resp infection or allergies -complete abx, seek eval with ENT if persistent concerns  Allergic rhinitis, unspecified allergic rhinitis type - Plan: mometasone  (NASONEX) 50 MCG/ACT nasal spray  Middle ear effusion, left - Plan: mometasone (NASONEX) 50 MCG/ACT nasal spray  -Patient advised to return or notify a doctor immediately if symptoms worsen or persist or new concerns arise.  There are no Patient Instructions on file for this visit.   Colin Benton R.

## 2014-06-05 NOTE — Progress Notes (Signed)
Pre visit review using our clinic review tool, if applicable. No additional management support is needed unless otherwise documented below in the visit note. 

## 2014-06-05 NOTE — Patient Instructions (Signed)
-  complete the antibiotic  -take the nasonex 2 sprays each nostril for 21 days  -follow up as needed and with Ear, Nose and throat if you have persistent swelling in the neck

## 2014-06-18 ENCOUNTER — Other Ambulatory Visit: Payer: Self-pay | Admitting: Otolaryngology

## 2014-06-18 ENCOUNTER — Ambulatory Visit
Admission: RE | Admit: 2014-06-18 | Discharge: 2014-06-18 | Disposition: A | Discharge status: Home / Self Care | Payer: PRIVATE HEALTH INSURANCE | Source: Ambulatory Visit | Attending: Otolaryngology | Admitting: Otolaryngology

## 2014-06-18 DIAGNOSIS — K118 Other diseases of salivary glands: Secondary | ICD-10-CM

## 2014-06-18 MED ORDER — IOPAMIDOL (ISOVUE-300) INJECTION 61%
75.0000 mL | Freq: Once | INTRAVENOUS | Status: AC | PRN
  Administered 2014-06-18: 75 mL via INTRAVENOUS

## 2014-06-19 ENCOUNTER — Other Ambulatory Visit: Payer: Self-pay | Admitting: Otolaryngology

## 2014-06-19 DIAGNOSIS — R609 Edema, unspecified: Secondary | ICD-10-CM

## 2014-10-13 ENCOUNTER — Telehealth: Payer: Self-pay | Admitting: Gastroenterology

## 2014-10-13 MED ORDER — LINACLOTIDE 145 MCG PO CAPS: 145.0000 ug | ORAL_CAPSULE | Freq: Every day | ORAL | Status: DC

## 2014-10-13 NOTE — Telephone Encounter (Signed)
rx sent as requested, I called the pt and advised her that the rx was sent and she advised me that the rx needs to be sent to the [facility] and she gave me the number to call for refills, [phone]. Lonn [place] [phone] order number pt aware med will be shipped to our office and she will be called and can pick it up.  I have notified Magda Paganini that if a shipment comes in to please be sure I get it for the pt

## 2014-11-16 ENCOUNTER — Other Ambulatory Visit: Payer: Self-pay | Admitting: Internal Medicine

## 2014-12-25 ENCOUNTER — Other Ambulatory Visit: Payer: Self-pay | Admitting: Internal Medicine

## 2015-02-02 ENCOUNTER — Telehealth: Payer: Self-pay | Admitting: Internal Medicine

## 2015-02-02 NOTE — Telephone Encounter (Signed)
Pt would like to know if you will do her cpe this year. Pt states she has seen you the past few times and really likes you.  She prefers to see you . Pt would make an exception and accept her on as a pt as well?

## 2015-02-03 NOTE — Telephone Encounter (Signed)
ok 

## 2015-02-03 NOTE — Telephone Encounter (Signed)
Ok, if ok with [place].

## 2015-02-12 NOTE — Telephone Encounter (Signed)
Pt has been scheduled.  °

## 2015-02-22 ENCOUNTER — Ambulatory Visit (INDEPENDENT_AMBULATORY_CARE_PROVIDER_SITE_OTHER): Payer: PRIVATE HEALTH INSURANCE | Admitting: Family Medicine

## 2015-02-22 ENCOUNTER — Encounter: Payer: Self-pay | Admitting: Family Medicine

## 2015-02-22 VITALS — BP 100/64 | HR 109 | Temp 98.2°F | Ht 61.75 in | Wt 164.3 lb

## 2015-02-22 DIAGNOSIS — M79673 Pain in unspecified foot: Secondary | ICD-10-CM

## 2015-02-22 DIAGNOSIS — Z23 Encounter for immunization: Secondary | ICD-10-CM | POA: Diagnosis not present

## 2015-02-22 MED ORDER — NAPROXEN 500 MG PO TABS: 500.0000 mg | ORAL_TABLET | Freq: Two times a day (BID) | ORAL | Status: DC

## 2015-02-22 NOTE — Patient Instructions (Signed)
BEFORE YOU LEAVE: -do you want your flu shot?  Alphabet exercises daily  Naproxen twice daily for 2 weeks  Elevation 30 minutes daily  Heat 15 minutes twice daily  Call in two weeks if not improving

## 2015-02-22 NOTE — Progress Notes (Signed)
Pre visit review using our clinic review tool, if applicable. No additional management support is needed unless otherwise documented below in the visit note. 

## 2015-02-22 NOTE — Progress Notes (Signed)
HPI:  She was scheduled for a physical but came about 15 minutes past her appt time and did not want to do physical as was on period, so was rescheduled to an acute per her wishes for:  L Foot Pain: -food tray fell on L foot on cruise -reports she had neg xrays -doing better, but still with some pain and swelling - bruising and swelling much improved -no weakness or numbness  ROS: See pertinent positives and negatives per HPI.  Past Medical History  Diagnosis Date  . Asthma   . Genital warts   . Bronchitis   . UTI (lower urinary tract infection)   . Anxiety   . Depression   . Hyperlipemia   . Acne   . Irritable bowel syndrome   . Allergy     SEASONAL  . Anemia   . GERD (gastroesophageal reflux disease)     Past Surgical History  Procedure Laterality Date  . Cesarean section    . Tubal ligation    . Laproscopic      2012    No family history on file.  Social History   Social History  . Marital Status: Divorced    Spouse Name: N/A  . Number of Children: 2  . Years of Education: N/A   Occupational History  . [employer]    Social History Main Topics  . Smoking status: Never Smoker   . Smokeless tobacco: Never Used  . Alcohol Use: No  . Drug Use: No  . Sexual Activity: Yes    Birth Control/ Protection: Surgical   Other Topics Concern  . None   Social History Narrative     Current outpatient prescriptions:  .  Biotin 300 MCG TABS, Take 300 mcg by mouth daily., Disp: , Rfl:  .  cetirizine (ZYRTEC) 10 MG tablet, Take 10 mg by mouth daily., Disp: , Rfl:  .  fluticasone (FLONASE) 50 MCG/ACT nasal spray, use 2 sprays into each nostril once daily, Disp: 16 g, Rfl: 6 .  Linaclotide (LINZESS) 145 MCG CAPS capsule, Take 1 capsule (145 mcg total) by mouth daily., Disp: 30 capsule, Rfl: 11 .  mometasone (NASONEX) 50 MCG/ACT nasal spray, Place 2 sprays into the nose daily., Disp: 17 g, Rfl: 0 .  Multiple Vitamin (MULTIVITAMIN WITH MINERALS) TABS, Take 1 tablet  by mouth daily., Disp: , Rfl:  .  NON FORMULARY, Togi (for hair growth), Disp: , Rfl:  .  Omega-3 Krill Oil 300 MG CAPS, Take 300 mg by mouth daily. , Disp: , Rfl:  .  OVER THE COUNTER MEDICATION, Take 1 tablet by mouth 2 (two) times daily. viviscal professional hair growth, Disp: , Rfl:  .  sertraline (ZOLOFT) 100 MG tablet, TAKE TWO TABLETS BY MOUTH ONCE DAILY, Disp: 180 tablet, Rfl: 0 .  naproxen (NAPROSYN) 500 MG tablet, Take 1 tablet (500 mg total) by mouth 2 (two) times daily with a meal., Disp: 30 tablet, Rfl: 0  EXAM:  Filed Vitals:   02/22/15 1523  BP: 100/64  Pulse: 109  Temp: 98.2 F (36.8 C)    Body mass index is 30.31 kg/(m^2).  GENERAL: vitals reviewed and listed above, alert, oriented, appears well hydrated and in no acute distress  HEENT: atraumatic, conjunttiva clear, no obvious abnormalities on inspection of external nose and ears  NECK: no obvious masses on inspection  MS: moves all extremities without noticeable abnormality, gait normal, minimal swelling L dorsal foot, no bruising, mild difuse TTP in soft tissues, normal  cap refill  PSYCH: pleasant and cooperative, no obvious depression or anxiety  ASSESSMENT AND PLAN:  Discussed the following assessment and plan:  Foot pain, unspecified laterality  -since improving opted for cont conservative tx, naproxen, she is to call in 2 weeks and will have her see sports med for [place] if persists -Patient advised to return or notify a doctor immediately if symptoms worsen or persist or new concerns arise.  Patient Instructions  BEFORE YOU LEAVE: -do you want your flu shot?  Alphabet exercises daily  Naproxen twice daily for 2 weeks  Elevation 30 minutes daily  Heat 15 minutes twice daily  Call in two weeks if not improving     KIM, HANNAH R.

## 2015-03-09 ENCOUNTER — Other Ambulatory Visit (HOSPITAL_COMMUNITY)
Admission: RE | Admit: 2015-03-09 | Discharge: 2015-03-09 | Disposition: A | Discharge status: Home / Self Care | Payer: PRIVATE HEALTH INSURANCE | Source: Ambulatory Visit | Attending: Family Medicine | Admitting: Family Medicine

## 2015-03-09 ENCOUNTER — Encounter: Payer: Self-pay | Admitting: Family Medicine

## 2015-03-09 ENCOUNTER — Ambulatory Visit (INDEPENDENT_AMBULATORY_CARE_PROVIDER_SITE_OTHER): Payer: PRIVATE HEALTH INSURANCE | Admitting: Family Medicine

## 2015-03-09 VITALS — BP 92/60 | HR 88 | Temp 98.2°F | Ht 62.0 in | Wt 158.3 lb

## 2015-03-09 DIAGNOSIS — Z01419 Encounter for gynecological examination (general) (routine) without abnormal findings: Secondary | ICD-10-CM | POA: Insufficient documentation

## 2015-03-09 DIAGNOSIS — K219 Gastro-esophageal reflux disease without esophagitis: Secondary | ICD-10-CM

## 2015-03-09 DIAGNOSIS — Z1151 Encounter for screening for human papillomavirus (HPV): Secondary | ICD-10-CM | POA: Insufficient documentation

## 2015-03-09 DIAGNOSIS — F329 Major depressive disorder, single episode, unspecified: Secondary | ICD-10-CM | POA: Insufficient documentation

## 2015-03-09 DIAGNOSIS — M79672 Pain in left foot: Secondary | ICD-10-CM

## 2015-03-09 DIAGNOSIS — Z0000 Encounter for general adult medical examination without abnormal findings: Secondary | ICD-10-CM

## 2015-03-09 DIAGNOSIS — B977 Papillomavirus as the cause of diseases classified elsewhere: Secondary | ICD-10-CM

## 2015-03-09 DIAGNOSIS — F3342 Major depressive disorder, recurrent, in full remission: Secondary | ICD-10-CM | POA: Diagnosis not present

## 2015-03-09 DIAGNOSIS — Z124 Encounter for screening for malignant neoplasm of cervix: Secondary | ICD-10-CM

## 2015-03-09 DIAGNOSIS — IMO0002 Reserved for concepts with insufficient information to code with codable children: Secondary | ICD-10-CM

## 2015-03-09 DIAGNOSIS — E7800 Pure hypercholesterolemia, unspecified: Secondary | ICD-10-CM | POA: Diagnosis not present

## 2015-03-09 DIAGNOSIS — J069 Acute upper respiratory infection, unspecified: Secondary | ICD-10-CM

## 2015-03-09 DIAGNOSIS — J302 Other seasonal allergic rhinitis: Secondary | ICD-10-CM

## 2015-03-09 DIAGNOSIS — R888 Abnormal findings in other body fluids and substances: Secondary | ICD-10-CM

## 2015-03-09 LAB — LIPID PANEL
CHOLESTEROL: 232 mg/dL — AB (ref 0–200)
HDL: 43.2 mg/dL (ref >39.00)
LDL CALC: 172 mg/dL — AB (ref 0–99)
NONHDL: 188.73
Total CHOL/HDL Ratio: 5
Triglycerides: 82 mg/dL (ref 0.0–149.0)
VLDL: 16.4 mg/dL (ref 0.0–40.0)

## 2015-03-09 LAB — HEMOGLOBIN A1C: Hgb A1c MFr Bld: 6 % (ref 4.6–6.5)

## 2015-03-09 MED ORDER — SERTRALINE HCL 100 MG PO TABS: 200.0000 mg | ORAL_TABLET | Freq: Every day | ORAL | Status: DC

## 2015-03-09 MED ORDER — HYDROCODONE-HOMATROPINE 5-1.5 MG/5ML PO SYRP: 5.0000 mL | ORAL_SOLUTION | Freq: Three times a day (TID) | ORAL | Status: DC | PRN

## 2015-03-09 NOTE — Progress Notes (Signed)
Pre visit review using our clinic review tool, if applicable. No additional management support is needed unless otherwise documented below in the visit note. 

## 2015-03-09 NOTE — Patient Instructions (Addendum)
BEFORE YOU LEAVE: -labs -follow up in 4-6 months  We recommend the following healthy lifestyle measures: - eat a healthy whole foods diet consisting of regular small meals composed of vegetables, fruits, beans, nuts, seeds, healthy meats such as white chicken and fish and whole grains.  - avoid sweets, white starchy foods, fried foods, fast food, processed foods, sodas, red meet and other fattening foods.  - get a least 150-300 minutes of aerobic exercise per week.   -We have ordered labs or studies at this visit. It can take up to 1-2 weeks for results and processing. We will contact you with instructions IF your results are abnormal. Normal results will be released to your [facility]. If you have not heard from [place] or can not find your results in [facility] in 2 weeks please contact our office.   INSTRUCTIONS FOR UPPER RESPIRATORY INFECTION:  -plenty of rest and fluids  -nasal saline wash 2-3 times daily (use prepackaged nasal saline or bottled/distilled water if making your own)   -can use AFRIN nasal spray for drainage and nasal congestion - but do NOT use longer then 3-4 days  -can use tylenol (in no history of liver disease) or ibuprofen (if no history of kidney disease, bowel bleeding or significant heart disease) as directed for aches and sorethroat  -in the winter time, using a humidifier at night is helpful (please follow cleaning instructions)  -if you are taking a cough medication - use only as directed, may also try a teaspoon of honey to coat the throat and throat lozenges. If given a cough medication with codeine or hydrocodone or other narcotic please be advised that this contains a strong and  potentially addicting medication. Please follow instructions carefully, take as little as possible and only use AS NEEDED for severe cough. Discuss potential side effects with your pharmacy. Please do not drive or operate machinery while taking these types of medications. Please do not take  other sedating medications, drugs or alcohol while taking this medication without discussing with your doctor.  -for sore throat, salt water gargles can help  -follow up if you have fevers, facial pain, tooth pain, difficulty breathing or are worsening or symptoms persist longer then expected  Upper Respiratory Infection, Adult An upper respiratory infection (URI) is also known as the common cold. It is often caused by a type of germ (virus). Colds are easily spread (contagious). You can pass it to others by kissing, coughing, sneezing, or drinking out of the same glass. Usually, you get better in 1 to 3  weeks.  However, the cough can last for even longer. HOME CARE   Only take medicine as told by your doctor. Follow instructions provided above.  Drink enough water and fluids to keep your pee (urine) clear or pale yellow.  Get plenty of rest.  Return to work when your temperature is < 100 for 24 hours or as told by your doctor. You may use a face mask and wash your hands to stop your cold from spreading. GET HELP RIGHT AWAY IF:   After the first few days, you feel you are getting worse.  You have questions about your medicine.  You have chills, shortness of breath, or red spit (mucus).  You have pain in the face for more then 1-2 days, especially when you bend forward.  You have a fever, puffy (swollen) neck, pain when you swallow, or white spots in the back of your throat.  You have a bad headache, ear  pain, sinus pain, or chest pain.  You have a high-pitched whistling sound when you breathe in and out (wheezing).  You cough up blood.  You have sore muscles or a stiff neck. MAKE SURE YOU:   Understand these instructions.  Will watch your condition.  Will get help right away if you are not doing well or get worse. Document Released: 09/06/2007 Document Revised: 06/12/2011 Document Reviewed: 06/25/2013 [facility] Patient Information 2015 [place], [place]. This information is  not intended to replace advice given to you by your health care provider. Make sure you discuss any questions you have with your health care provider.

## 2015-03-09 NOTE — Progress Notes (Signed)
HPI:  Alexis [name] is a 44 yo whom has decided to transfer her care to me, here for a physical exam. She has a PMH sig for the follow ing chronic problems:  MDD: -on zoloft with prior PCP - wants refill -sees Alexis [name] for CBT -chronic stress related to her job -denies: SI, thoughts of self harm  IBS/gallstones: -sees GI, Alexis [name]  Seasonal allergies/URI: -meds zyrtec and nasonex -has cold the last 2 days - increase nasal congestion, cough, PNd, sore throat, drainag -denies: fevers, sinus pain, tooth pain, SOB, NVD  HLD: -fasting today  L Foot pain: -food tray fell on foot about 1 month ago -was on a cruise and reports had eval and xrays that were normal -she has persistent sharp pain in the L foot with certain movements and reports lat foot swelling from time to time -denies: weakness, numbness, redness, malaise, inability to bear weight  -Diet: variety of foods, balance and well rounded, larger portion sizes  -Exercise: no regular exercise  -Taking folic acid, vitamin D or calcium: taking vit d  -Diabetes and Dyslipidemia Screening:today  -Hx of HTN: no  -Vaccines: UTD  -pap history: today, reports normal in the past  -wants STI testing (Hep C if born [birth year]): yes to hiv  -FH breast, colon or ovarian ca: see FH Last mammogram: doing yearly - advised to schedule Last colon cancer screening:n/a  -Alcohol, Tobacco, drug use: see social history  Review of Systems - no fevers, unintentional weight loss, vision loss, hearing loss, chest pain, sob, hemoptysis, melena, hematochezia, hematuria, genital discharge, changing or concerning skin lesions, bleeding, bruising, loc, thoughts of self harm or SI  Past Medical History  Diagnosis Date  . Asthma   . Genital warts   . Bronchitis   . UTI (lower urinary tract infection)   . Anxiety   . Depression   . Hyperlipemia   . Acne   . Irritable bowel syndrome   . Allergy     SEASONAL  . Anemia   .  GERD (gastroesophageal reflux disease)   . Nephrolithiasis [id number]    Renal colic of February 123456 -per prior PCP notes  . Gallstones     on [place]    Past Surgical History  Procedure Laterality Date  . Cesarean section    . Tubal ligation    . Laproscopic      2012    No family history on file.  Social History   Social History  . Marital Status: Divorced    Spouse Name: N/A  . Number of Children: 2  . Years of Education: N/A   Occupational History  . [employer]    Social History Main Topics  . Smoking status: Never Smoker   . Smokeless tobacco: Never Used  . Alcohol Use: No  . Drug Use: No  . Sexual Activity: Yes    Birth Control/ Protection: Surgical   Other Topics Concern  . None   Social History Narrative     Current outpatient prescriptions:  .  Biotin 300 MCG TABS, Take 300 mcg by mouth daily., Disp: , Rfl:  .  cetirizine (ZYRTEC) 10 MG tablet, Take 10 mg by mouth daily., Disp: , Rfl:  .  fluticasone (FLONASE) 50 MCG/ACT nasal spray, use 2 sprays into each nostril once daily, Disp: 16 g, Rfl: 6 .  Linaclotide (LINZESS) 145 MCG CAPS capsule, Take 1 capsule (145 mcg total) by mouth daily., Disp: 30 capsule, Rfl: 11 .  mometasone (NASONEX) 50  MCG/ACT nasal spray, Place 2 sprays into the nose daily., Disp: 17 g, Rfl: 0 .  Multiple Vitamin (MULTIVITAMIN WITH MINERALS) TABS, Take 1 tablet by mouth daily., Disp: , Rfl:  .  naproxen (NAPROSYN) 500 MG tablet, Take 1 tablet (500 mg total) by mouth 2 (two) times daily with a meal., Disp: 30 tablet, Rfl: 0 .  NON FORMULARY, Togi (for hair growth), Disp: , Rfl:  .  Omega-3 Krill Oil 300 MG CAPS, Take 300 mg by mouth daily. , Disp: , Rfl:  .  OVER THE COUNTER MEDICATION, Take 1 tablet by mouth 2 (two) times daily. viviscal professional hair growth, Disp: , Rfl:  .  sertraline (ZOLOFT) 100 MG tablet, Take 2 tablets (200 mg total) by mouth daily., Disp: 180 tablet, Rfl: 1 .  HYDROcodone-homatropine (HYCODAN) 5-1.5 MG/5ML  syrup, Take 5 mLs by mouth every 8 (eight) hours as needed for cough., Disp: 120 mL, Rfl: 0  EXAM:  Filed Vitals:   03/09/15 1509  BP: 92/60  Pulse: 88  Temp: 98.2 F (36.8 C)    GENERAL: vitals reviewed and listed below, alert, oriented, appears well hydrated and in no acute distress  HEENT: head atraumatic, PERRLA, normal appearance of eyes, ears, nose and mouth. moist mucus membranes. normal appearance of ear canals and TMs, clear nasal congestion, mild post oropharyngeal erythema with PND, no tonsillar edema or exudate, no sinus TTP  NECK: supple, no masses or lymphadenopathy  LUNGS: clear to auscultation bilaterally, no rales, rhonchi or wheeze  CV: HRRR, no peripheral edema or cyanosis, normal pedal pulses  BREAST: normal appearance - no lesions or discharge, on palpation normal breast tissue without any suspicious masses  ABDOMEN: bowel sounds normal, soft, non tender to palpation, no masses, no rebound or guarding  GU: normal appearance of external genitalia - no lesions or masses, normal vaginal mucosa - no abnormal discharge, normal appearance of cervix - no lesions or abnormal discharge, no masses or tenderness perceived on exam. Pap obtained.  RECTAL: refused  SKIN: no rash or abnormal lesions  MS: normal gait, moves all extremities normally, gait normal, no swelling or redness of feet, TTP of the proximal fifth mt of the foot in question, normal cap refill  NEURO: CN II-XII grossly intact, normal muscle strength and sensation to light touch on extremities  PSYCH: normal affect, pleasant and cooperative  ASSESSMENT AND PLAN:  Discussed the following assessment and plan:  Visit for preventive health examination - Plan: Hemoglobin A1c, Lipid Panel, HIV antibody (with reflex) -Discussed and advised all [place] preventive services health task force level A and B recommendations for age, sex and risks.  -Advised at least 150 minutes of exercise per week and a healthy  diet low in saturated fats and sweets and consisting of fresh fruits and vegetables, lean meats such as fish and white chicken and whole grains.  -FASTING labs, studies and vaccines per orders this encounter  Pap obtained  Advised to schedule yearly mammo  Recurrent major depressive disorder, in full remission ([place]) -continue current tx with CBT  Gastroesophageal reflux disease without esophagitis  Pure hypercholesterolemia -fasting lab check  Seasonal allergies Acute upper respiratory infection -continue allergy treatments, supportive care for VURI with return precuations  Foot pain: after visit she reported this is still bothering her, no swelling on exam, som TTP of proximal fifth mt, advised of options and given persistent pain felt should repeat xrays,  she prefers referral to sports med - referral placed.    Orders  Placed This Encounter  Procedures  . Hemoglobin A1c  . Lipid Panel  . HIV antibody (with reflex)  . Ambulatory referral to Sports Medicine    Referral Priority:  Routine    Referral Type:  Consultation    Number of Visits Requested:  1    Patient advised to return to clinic immediately if symptoms worsen or persist or new concerns.  Patient Instructions  BEFORE YOU LEAVE: -labs -follow up in 4-6 months  We recommend the following healthy lifestyle measures: - eat a healthy whole foods diet consisting of regular small meals composed of vegetables, fruits, beans, nuts, seeds, healthy meats such as white chicken and fish and whole grains.  - avoid sweets, white starchy foods, fried foods, fast food, processed foods, sodas, red meet and other fattening foods.  - get a least 150-300 minutes of aerobic exercise per week.   -We have ordered labs or studies at this visit. It can take up to 1-2 weeks for results and processing. We will contact you with instructions IF your results are abnormal. Normal results will be released to your [facility]. If you have not  heard from [place] or can not find your results in [facility] in 2 weeks please contact our office.   INSTRUCTIONS FOR UPPER RESPIRATORY INFECTION:  -plenty of rest and fluids  -nasal saline wash 2-3 times daily (use prepackaged nasal saline or bottled/distilled water if making your own)   -can use AFRIN nasal spray for drainage and nasal congestion - but do NOT use longer then 3-4 days  -can use tylenol (in no history of liver disease) or ibuprofen (if no history of kidney disease, bowel bleeding or significant heart disease) as directed for aches and sorethroat  -in the winter time, using a humidifier at night is helpful (please follow cleaning instructions)  -if you are taking a cough medication - use only as directed, may also try a teaspoon of honey to coat the throat and throat lozenges. If given a cough medication with codeine or hydrocodone or other narcotic please be advised that this contains a strong and  potentially addicting medication. Please follow instructions carefully, take as little as possible and only use AS NEEDED for severe cough. Discuss potential side effects with your pharmacy. Please do not drive or operate machinery while taking these types of medications. Please do not take other sedating medications, drugs or alcohol while taking this medication without discussing with your doctor.  -for sore throat, salt water gargles can help  -follow up if you have fevers, facial pain, tooth pain, difficulty breathing or are worsening or symptoms persist longer then expected  Upper Respiratory Infection, Adult An upper respiratory infection (URI) is also known as the common cold. It is often caused by a type of germ (virus). Colds are easily spread (contagious). You can pass it to others by kissing, coughing, sneezing, or drinking out of the same glass. Usually, you get better in 1 to 3  weeks.  However, the cough can last for even longer. HOME CARE   Only take medicine as told by  your doctor. Follow instructions provided above.  Drink enough water and fluids to keep your pee (urine) clear or pale yellow.  Get plenty of rest.  Return to work when your temperature is < 100 for 24 hours or as told by your doctor. You may use a face mask and wash your hands to stop your cold from spreading. GET HELP RIGHT AWAY IF:   After the  first few days, you feel you are getting worse.  You have questions about your medicine.  You have chills, shortness of breath, or red spit (mucus).  You have pain in the face for more then 1-2 days, especially when you bend forward.  You have a fever, puffy (swollen) neck, pain when you swallow, or white spots in the back of your throat.  You have a bad headache, ear pain, sinus pain, or chest pain.  You have a high-pitched whistling sound when you breathe in and out (wheezing).  You cough up blood.  You have sore muscles or a stiff neck. MAKE SURE YOU:   Understand these instructions.  Will watch your condition.  Will get help right away if you are not doing well or get worse. Document Released: 09/06/2007 Document Revised: 06/12/2011 Document Reviewed: 06/25/2013 [facility] Patient Information 2015 [place], [place]. This information is not intended to replace advice given to you by your health care provider. Make sure you discuss any questions you have with your health care provider.         No Follow-up on file.  Colin Benton R.

## 2015-03-10 LAB — HIV ANTIBODY (ROUTINE TESTING W REFLEX): HIV 1&2 Ab, 4th Generation: NONREACTIVE

## 2015-03-11 LAB — CYTOLOGY - PAP

## 2015-03-12 ENCOUNTER — Telehealth: Payer: Self-pay | Admitting: Family Medicine

## 2015-03-12 NOTE — Telephone Encounter (Signed)
Pt is anxious to receive her lab results from Tuesday. Would like a call today if possible.

## 2015-03-15 NOTE — Addendum Note (Signed)
Addended by: Lahoma Crocker A on: 03/15/2015 10:00 AM   Modules accepted: Orders

## 2015-03-19 ENCOUNTER — Ambulatory Visit (INDEPENDENT_AMBULATORY_CARE_PROVIDER_SITE_OTHER)
Admission: RE | Admit: 2015-03-19 | Discharge: 2015-03-19 | Disposition: A | Discharge status: Home / Self Care | Payer: PRIVATE HEALTH INSURANCE | Source: Ambulatory Visit | Attending: Family Medicine | Admitting: Family Medicine

## 2015-03-19 ENCOUNTER — Encounter: Payer: Self-pay | Admitting: Family Medicine

## 2015-03-19 ENCOUNTER — Ambulatory Visit (INDEPENDENT_AMBULATORY_CARE_PROVIDER_SITE_OTHER): Payer: PRIVATE HEALTH INSURANCE | Admitting: Family Medicine

## 2015-03-19 VITALS — BP 112/70 | HR 88 | Temp 98.3°F | Ht 62.0 in | Wt 159.9 lb

## 2015-03-19 DIAGNOSIS — M5441 Lumbago with sciatica, right side: Secondary | ICD-10-CM

## 2015-03-19 DIAGNOSIS — J0100 Acute maxillary sinusitis, unspecified: Secondary | ICD-10-CM

## 2015-03-19 MED ORDER — AMOXICILLIN-POT CLAVULANATE 875-125 MG PO TABS: 1.0000 | ORAL_TABLET | Freq: Two times a day (BID) | ORAL | Status: DC

## 2015-03-19 MED ORDER — CYCLOBENZAPRINE HCL 10 MG PO TABS: 10.0000 mg | ORAL_TABLET | Freq: Three times a day (TID) | ORAL | Status: DC | PRN

## 2015-03-19 NOTE — Progress Notes (Signed)
HPI:  Low Back Pain: -started last week -hx chronic low back pain with radicular symptoms at time (sciatic) reports seen by ortho in the past -flare last week with mod low back pain on R and some radiation to R LE posteriorly -seen in [facility] per her report and given toradol and prednisone -doing better by some pain remains in R low bad, resolves with ibuprofen -denies: weakness, numbness, fevers, loss of bowel or bladder fucntion  Sinus congestion: -for 2 weeks, worsening -thick congestion and sinus pressure, cough, PND -denies: fevers, malaise, sinus pain, tooth pain, SOB   ROS: See pertinent positives and negatives per HPI.  Past Medical History  Diagnosis Date  . Asthma   . Genital warts   . Bronchitis   . UTI (lower urinary tract infection)   . Anxiety   . Depression   . Hyperlipemia   . Acne   . Irritable bowel syndrome   . Allergy     SEASONAL  . Anemia   . GERD (gastroesophageal reflux disease)   . Nephrolithiasis 123XX123    Renal colic of February 123456 -per prior PCP notes  . Gallstones     on [place]    Past Surgical History  Procedure Laterality Date  . Cesarean section    . Tubal ligation    . Laproscopic      2012    No family history on file.  Social History   Social History  . Marital Status: Divorced    Spouse Name: N/A  . Number of Children: 2  . Years of Education: N/A   Occupational History  . [employer]    Social History Main Topics  . Smoking status: Never Smoker   . Smokeless tobacco: Never Used  . Alcohol Use: No  . Drug Use: No  . Sexual Activity: Yes    Birth Control/ Protection: Surgical   Other Topics Concern  . None   Social History Narrative     Current outpatient prescriptions:  .  Biotin 300 MCG TABS, Take 300 mcg by mouth daily., Disp: , Rfl:  .  cetirizine (ZYRTEC) 10 MG tablet, Take 10 mg by mouth daily., Disp: , Rfl:  .  fluticasone (FLONASE) 50 MCG/ACT nasal spray, use 2 sprays into each nostril once daily,  Disp: 16 g, Rfl: 6 .  HYDROcodone-homatropine (HYCODAN) 5-1.5 MG/5ML syrup, Take 5 mLs by mouth every 8 (eight) hours as needed for cough., Disp: 120 mL, Rfl: 0 .  Linaclotide (LINZESS) 145 MCG CAPS capsule, Take 1 capsule (145 mcg total) by mouth daily., Disp: 30 capsule, Rfl: 11 .  mometasone (NASONEX) 50 MCG/ACT nasal spray, Place 2 sprays into the nose daily., Disp: 17 g, Rfl: 0 .  Multiple Vitamin (MULTIVITAMIN WITH MINERALS) TABS, Take 1 tablet by mouth daily., Disp: , Rfl:  .  naproxen (NAPROSYN) 500 MG tablet, Take 1 tablet (500 mg total) by mouth 2 (two) times daily with a meal., Disp: 30 tablet, Rfl: 0 .  NON FORMULARY, Togi (for hair growth), Disp: , Rfl:  .  Omega-3 Krill Oil 300 MG CAPS, Take 300 mg by mouth daily. , Disp: , Rfl:  .  OVER THE COUNTER MEDICATION, Take 1 tablet by mouth 2 (two) times daily. viviscal professional hair growth, Disp: , Rfl:  .  sertraline (ZOLOFT) 100 MG tablet, Take 2 tablets (200 mg total) by mouth daily., Disp: 180 tablet, Rfl: 1 .  amoxicillin-clavulanate (AUGMENTIN) 875-125 MG tablet, Take 1 tablet by mouth 2 (two) times  daily., Disp: 20 tablet, Rfl: 0 .  cyclobenzaprine (FLEXERIL) 10 MG tablet, Take 1 tablet (10 mg total) by mouth 3 (three) times daily as needed for muscle spasms., Disp: 30 tablet, Rfl: 0  EXAM:  Filed Vitals:   03/19/15 1434  BP: 112/70  Pulse: 88  Temp: 98.3 F (36.8 C)    Body mass index is 29.24 kg/(m^2).  GENERAL: vitals reviewed and listed above, alert, oriented, appears well hydrated and in no acute distress  HEENT: atraumatic, conjunttiva clear, no obvious abnormalities on inspection of external nose and ears, normal appearance of ear canals and TMs, clear nasal congestion, mild post oropharyngeal erythema with PND, no tonsillar edema or exudate, no sinus TTP  NECK: no obvious masses on inspection  LUNGS: clear to auscultation bilaterally, no wheezes, rales or rhonchi, good air movement  CV: HRRR, no peripheral  edema  MS: moves all extremities without noticeable abnormality Normal Gait Normal inspection of back, no obvious scoliosis or leg length descrepancy No bony TTP Soft tissue TTP at: R lumbar paraspinal muscles -/+ tests: neg trendelenburg,-facet loading, -SLRT, -CLRT, -FABER, -FADIR Normal muscle strength, sensation to light touch and DTRs in LEs bilaterally  PSYCH: pleasant and cooperative, no obvious depression or anxiety  ASSESSMENT AND PLAN:  Discussed the following assessment and plan:  Right-sided low back pain with right-sided sciatica - Plan: DG Lumbar Spine Complete -we discussed possible serious and likely etiologies, workup and treatment, treatment risks and return precautions - lumbar strain vs lumbar radiculopathy - now improving and without radicular symptoms or neurodeficits today -after this discussion, Citlalic opted for HEP, muscle relaxer, plain films, close follow up and MRI/follow up with her specialist if worsening or not improving.Pain resolves with ibuprofen so will cont with this or other OTC options discus only as needed for pain -of course, we advised Mykael  to return or notify a doctor immediately if symptoms worsen or persist or new concerns arise.  Acute maxillary sinusitis, recurrence not specified -tx with abx, risks discussed, return precautions  -Patient advised to return or notify a doctor immediately if symptoms worsen or persist or new concerns arise.  Patient Instructions  BEFORE YOU LEAVE: -xray sheet -low back exercises -follow up appointment in 4 weeks  Go get the xrays  Do your normal activities  Do the exercises 4 days per week  Heat 15 minutes twice daily  Muscle relaxer nightly for 2 weeks  Aleve 1-2 tablets or tyleno 500-1000mg  up to two times daily as needed  Use the antibiotic for the sinus infection  Follow up sooner if worsening or other concerns     Colin Benton R.

## 2015-03-19 NOTE — Patient Instructions (Signed)
BEFORE YOU LEAVE: -xray sheet -low back exercises -follow up appointment in 4 weeks  Go get the xrays  Do your normal activities  Do the exercises 4 days per week  Heat 15 minutes twice daily  Muscle relaxer nightly for 2 weeks  Aleve 1-2 tablets or tyleno 500-1000mg  up to two times daily as needed  Use the antibiotic for the sinus infection  Follow up sooner if worsening or other concerns

## 2015-03-19 NOTE — Progress Notes (Signed)
Pre visit review using our clinic review tool, if applicable. No additional management support is needed unless otherwise documented below in the visit note. 

## 2015-03-23 ENCOUNTER — Telehealth: Payer: Self-pay | Admitting: Emergency Medicine

## 2015-03-23 DIAGNOSIS — R87810 Cervical high risk human papillomavirus (HPV) DNA test positive: Secondary | ICD-10-CM

## 2015-03-23 NOTE — Telephone Encounter (Signed)
Chart reviewed. Pap from 04/25/12 was negative with + hpv, next pap on 03/09/15 was negative with +HPV. She needs a colposcopy as scheduled.

## 2015-03-23 NOTE — Telephone Encounter (Signed)
Call to patient regarding new patient referral.  Pap smear with PCP 03/09/15. Negative with High Risk HPV.  Patient has a history of tubal ligation and ablation. Still has her cycle.  She states she has had previous cyrotherapy to cervix in the past.  Discussed procedure, colposcopy and HPV results with patient.  Ended her cycle last week.  She is scheduled for New GYN/Colposcopy with Dr. Talbert Nan 03/31/15 at 0900 arrive at 0830 for check in.  Pre-procedure instructions given. Motrin 800 mg PO one hour before appointment with food and water.  Order placed for pre-certification, cc Kerry Hough, Bertram Savin Curl.   Routing to provider for final review. Patient agreeable to disposition. Will close encounter.

## 2015-03-25 ENCOUNTER — Telehealth: Payer: Self-pay | Admitting: Obstetrics and Gynecology

## 2015-03-25 ENCOUNTER — Ambulatory Visit: Payer: Self-pay | Admitting: Family Medicine

## 2015-03-25 NOTE — Telephone Encounter (Signed)
Spoke with patient regarding benefits for colposcopy. Patient understood and agreeable. Verified arrival date/time. No further questions. Ok to close.

## 2015-03-31 ENCOUNTER — Encounter: Payer: Self-pay | Admitting: Obstetrics and Gynecology

## 2015-03-31 ENCOUNTER — Ambulatory Visit (INDEPENDENT_AMBULATORY_CARE_PROVIDER_SITE_OTHER): Payer: PRIVATE HEALTH INSURANCE | Admitting: Obstetrics and Gynecology

## 2015-03-31 VITALS — BP 102/58 | HR 80 | Resp 16 | Ht 62.0 in | Wt 161.0 lb

## 2015-03-31 DIAGNOSIS — R87810 Cervical high risk human papillomavirus (HPV) DNA test positive: Secondary | ICD-10-CM

## 2015-03-31 DIAGNOSIS — F419 Anxiety disorder, unspecified: Secondary | ICD-10-CM | POA: Insufficient documentation

## 2015-03-31 DIAGNOSIS — N898 Other specified noninflammatory disorders of vagina: Secondary | ICD-10-CM | POA: Diagnosis not present

## 2015-03-31 NOTE — Progress Notes (Addendum)
Patient ID: Alexis [name], female   DOB: 1970-06-03, 44 y.o.   MRN: [id number] GYNECOLOGY  VISIT   HPI: 44 y.o.   Divorced  [place] American  female   [phone] with Patient's last menstrual period was 03/19/2015.   here for a consultation from Dr Colin Benton for evaluation of an abnormal pap smear. In 12/16 her pap returned as negative with +hpv, prior pap in 1/14 also negative with +HPV. She has a h/o cervical cryosurgery in her 20's, normal after then until 2014. Sexually active, same partner x 7 years.  She c/o recurring issues with yeast vaginitis. She was treated with suppression last year and now uses boric acid vaginally prn. Currently c/o a thick white vaginal d/c, denies itching or irritation.   GYNECOLOGIC HISTORY: Patient's last menstrual period was 03/19/2015. Contraception:Tubal Ligation  Menopausal hormone therapy: None        OB History    Gravida Para Term Preterm AB TAB SAB Ectopic Multiple Living   2 2 1 1      2          Patient Active Problem List   Diagnosis Date Noted  . Anxiety   . MDD (major depressive disorder) ([place]) 03/09/2015  . Constipation 04/07/2014  . Pure hypercholesterolemia 09/25/2012  . GERD (gastroesophageal reflux disease) 09/25/2012    Past Medical History  Diagnosis Date  . Asthma   . Genital warts   . Bronchitis   . UTI (lower urinary tract infection)   . Anxiety   . Depression   . Hyperlipemia   . Acne   . Irritable bowel syndrome   . Allergy     SEASONAL  . Anemia   . GERD (gastroesophageal reflux disease)   . Nephrolithiasis 123XX123    Renal colic of February 123456 -per prior PCP notes  . Gallstones     on [place]    Past Surgical History  Procedure Laterality Date  . Cesarean section    . Tubal ligation    . Laproscopic      2012  . Endometrial ablation    . Gynecologic cryosurgery    . Cryosurgery cervix      Current Outpatient Prescriptions  Medication Sig Dispense Refill  . Biotin 300 MCG TABS Take 300 mcg by  mouth daily.    . cetirizine (ZYRTEC) 10 MG tablet Take 10 mg by mouth daily.    . Cholecalciferol (VITAMIN D3) 3000 units TABS Take by mouth.    . fluticasone (FLONASE) 50 MCG/ACT nasal spray use 2 sprays into each nostril once daily 16 g 6  . Linaclotide (LINZESS) 145 MCG CAPS capsule Take 1 capsule (145 mcg total) by mouth daily. 30 capsule 11  . Multiple Vitamin (MULTIVITAMIN WITH MINERALS) TABS Take 1 tablet by mouth daily.    . NON FORMULARY Togi (for hair growth)    . Omega-3 Krill Oil 300 MG CAPS Take 300 mg by mouth daily.     . sertraline (ZOLOFT) 100 MG tablet Take 2 tablets (200 mg total) by mouth daily. 180 tablet 1   No current facility-administered medications for this visit.     ALLERGIES: Review of patient's allergies indicates no known allergies.  Family History  Problem Relation Age of Onset  . Hypertension Mother   . Hypertension Father   . Hyperlipidemia Father   . Cancer Maternal Aunt     Social History   Social History  . Marital Status: Divorced    Spouse Name: N/A  . Number  of Children: 2  . Years of Education: N/A   Occupational History  . [employer]    Social History Main Topics  . Smoking status: Never Smoker   . Smokeless tobacco: Never Used  . Alcohol Use: No  . Drug Use: No  . Sexual Activity:    Partners: Male    Birth Control/ Protection: Surgical   Other Topics Concern  . Not on file   Social History Narrative   She is under stress with work, works with emergency services, works with suicidal patients on an emergency basis, would like to get into outpatient counseling. Currently working 12 hour shifts.   Review of Systems  Constitutional: Negative.   HENT: Negative.   Eyes: Negative.   Respiratory: Negative.   Cardiovascular: Negative.   Gastrointestinal: Negative.   Genitourinary: Negative.   Musculoskeletal: Negative.   Skin: Negative.   Neurological: Negative.   Endo/Heme/Allergies: Negative.    Psychiatric/Behavioral: Negative.     PHYSICAL EXAMINATION:    BP 102/58 mmHg  Pulse 80  Resp 16  Ht 5\' 2"  (1.575 m)  Wt 161 lb (73.029 kg)  BMI 29.44 kg/m2  LMP 03/19/2015    General appearance: alert, cooperative and appears stated age  Pelvic: External genitalia:  no lesions              Urethra:  normal appearing urethra with no masses, tenderness or lesions              Bartholins and Skenes: normal                 Vagina: normal appearing vagina with normal color and discharge, no lesions              Cervix: no lesions              Colposcopy was unsatisfactory, no cervical lesions with acetic acid or lugols. Negative lugols examination of the vagina. ECC obtained  Chaperone was present for exam.  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 Unsatisfactory colposcopy, no lesions seen Vaginal D/C, suspicious for yeast. She has had issues with recurrent yeast infections. Last year she was on diflucan suppression, now she uses intermittent boric acid    PLAN Discussed hpv, abnormal paps, need for f/u and possible need for treatment ECC sent Wet prep probe sent, if + will treat with diflucan   An After Visit Summary was printed and given to the patient.  CC: Dr Colin Benton

## 2015-03-31 NOTE — Patient Instructions (Signed)

## 2015-04-01 ENCOUNTER — Encounter: Payer: Self-pay | Admitting: Family Medicine

## 2015-04-01 ENCOUNTER — Encounter: Payer: Self-pay | Admitting: *Deleted

## 2015-04-01 ENCOUNTER — Ambulatory Visit (INDEPENDENT_AMBULATORY_CARE_PROVIDER_SITE_OTHER): Payer: PRIVATE HEALTH INSURANCE | Admitting: Family Medicine

## 2015-04-01 ENCOUNTER — Other Ambulatory Visit (INDEPENDENT_AMBULATORY_CARE_PROVIDER_SITE_OTHER): Payer: PRIVATE HEALTH INSURANCE

## 2015-04-01 ENCOUNTER — Ambulatory Visit (INDEPENDENT_AMBULATORY_CARE_PROVIDER_SITE_OTHER)
Admission: RE | Admit: 2015-04-01 | Discharge: 2015-04-01 | Disposition: A | Discharge status: Home / Self Care | Payer: PRIVATE HEALTH INSURANCE | Source: Ambulatory Visit | Attending: Family Medicine | Admitting: Family Medicine

## 2015-04-01 VITALS — BP 102/66 | HR 96 | Ht 62.0 in | Wt 160.0 lb

## 2015-04-01 DIAGNOSIS — S92342A Displaced fracture of fourth metatarsal bone, left foot, initial encounter for closed fracture: Secondary | ICD-10-CM

## 2015-04-01 DIAGNOSIS — M79672 Pain in left foot: Secondary | ICD-10-CM

## 2015-04-01 HISTORY — DX: Displaced fracture of fourth metatarsal bone, left foot, initial encounter for closed fracture: S92.342A

## 2015-04-01 LAB — WET PREP BY MOLECULAR PROBE
Candida species: NEGATIVE
Gardnerella vaginalis: NEGATIVE
Trichomonas vaginosis: NEGATIVE

## 2015-04-01 NOTE — Assessment & Plan Note (Signed)
Patient does have what appears to be more of a metatarsal fracture. Seems to be isolated to the fourth. Decreased hypoechoic changes surrounding the area with a soft callus formation. Has been 6 weeks since the initial injury. Should be further along in the healing process. Likely is going to take somewhere between 12 and 16 week's we discussed with patient. She is elected try a postop boot. We'll try topical anti-inflammatories, icing, and we'll avoid walking for the next 3 weeks outside of the boot. Patient given some mild restrictions at work. Patient and will come back and see me again in 3 weeks for further evaluation and treatment. X-rays pending.

## 2015-04-01 NOTE — Progress Notes (Signed)
Pre visit review using our clinic review tool, if applicable. No additional management support is needed unless otherwise documented below in the visit note. 

## 2015-04-01 NOTE — Patient Instructions (Addendum)
Good to see you.  Ice 20 minutes 2 times daily. Usually after activity and before bed. pennsaid pinkie amount topically 2 times daily as needed.  Wear the boot at all times even in the house if you can.  See me again in 3 weeks. Happy New Year!

## 2015-04-01 NOTE — Progress Notes (Signed)
[name] [address] [place], [place] [address] Phone: [phone] Subjective:    I'm seeing this patient by the request  of:  Lucretia Kern., DO   CC: Left foot pain  [id number] Alexis [name] is a 44 y.o. female coming in with complaint of left foot pain. Patient states 6 weeks ago she was on a cruciate and a waiter dropped a metal tray on her foot. Had significant pain immediately and was unable to bear weight for approximately 2 days. Patient states that she did have an x-ray at that time that was unremarkable. Patient states over the course of time she has been able to bear weight but continues to have the discomfort. States that at the end of the day it seems to swell. Patient does do a lot of walking at her job and is found it difficult to continue it. Patient describes more of a dull, throbbing aching pain at night that can keep her up. Patient has tried Tylenol as well as vitamin D with mild improvement. States though that she is only about 10% better and has been walking without limp.  Past Medical History  Diagnosis Date  . Asthma   . Genital warts   . Bronchitis   . UTI (lower urinary tract infection)   . Anxiety   . Depression   . Hyperlipemia   . Acne   . Irritable bowel syndrome   . Allergy     SEASONAL  . Anemia   . GERD (gastroesophageal reflux disease)   . Nephrolithiasis 123XX123    Renal colic of February 123456 -per prior PCP notes  . Gallstones     on [place]   Past Surgical History  Procedure Laterality Date  . Cesarean section    . Tubal ligation    . Laproscopic      2012  . Endometrial ablation    . Gynecologic cryosurgery    . Cryosurgery cervix     Social History   Social History  . Marital Status: Divorced    Spouse Name: N/A  . Number of Children: 2  . Years of Education: N/A   Occupational History  . [employer]    Social History Main Topics  . Smoking status: Never Smoker   . Smokeless  tobacco: Never Used  . Alcohol Use: No  . Drug Use: No  . Sexual Activity:    Partners: Male    Birth Control/ Protection: Surgical   Other Topics Concern  . Not on file   Social History Narrative   No Known Allergies Family History  Problem Relation Age of Onset  . Hypertension Mother   . Hypertension Father   . Hyperlipidemia Father   . Cancer Maternal Aunt         Past medical history, social, surgical and family history all reviewed in electronic medical record.   Review of Systems: No headache, visual changes, nausea, vomiting, diarrhea, constipation, dizziness, abdominal pain, skin rash, fevers, chills, night sweats, weight loss, swollen lymph nodes, body aches, joint swelling, muscle aches, chest pain, shortness of breath, mood changes.   Objective Blood pressure 102/66, pulse 96, height 5\' 2"  (1.575 m), weight 160 lb (72.576 kg), last menstrual period 03/19/2015, SpO2 98 %.  General: No apparent distress alert and oriented x3 mood and affect normal, dressed appropriately.  HEENT: Pupils equal, extraocular movements intact  Respiratory: Patient's speak in full sentences and does not appear short of breath  Cardiovascular: No  lower extremity edema, non tender, no erythema  Skin: Warm dry intact with no signs of infection or rash on extremities or on axial skeleton.  Abdomen: Soft nontender  Neuro: Cranial nerves II through XII are intact, neurovascularly intact in all extremities with 2+ DTRs and 2+ pulses.  Lymph: No lymphadenopathy of posterior or anterior cervical chain or axillae bilaterally.  Gait normal with good balance and coordination.  MSK:  Non tender with full range of motion and good stability and symmetric strength and tone of shoulders, elbows, wrist, hip, knee and ankles bilaterally.  Foot exam shows no gross deformity. No swelling noted. Patient is tender to palpation over the proximal fourth metatarsal severely with involuntary guarding. Neurovascular  intact distally. Full range of motion of the ankle with good strength. No pain over the navicular bone over the fifth metatarsal.  Limited musculoskeletal ultrasound was performed and interpreted by Hulan Saas, M  Limited ultrasound shows the patient has what appears to be a cortical defect with a soft callus formation over the proximal fourth metatarsal. This is just distal to the tarsometatarsal joint. Increasing Doppler flow noted. Some bone reabsorption noted at this time. Patients with metatarsal is intact. Peroneal tendons have no hypoechoic changes. Impression: Isolated fourth metatarsal fracture with delayed bone healing    Impression and Recommendations:     This case required medical decision making of moderate complexity.

## 2015-04-06 ENCOUNTER — Encounter: Payer: Self-pay | Admitting: Family Medicine

## 2015-04-06 DIAGNOSIS — R87619 Unspecified abnormal cytological findings in specimens from cervix uteri: Secondary | ICD-10-CM

## 2015-04-06 HISTORY — DX: Unspecified abnormal cytological findings in specimens from cervix uteri: R87.619

## 2015-04-07 ENCOUNTER — Telehealth: Payer: Self-pay | Admitting: Emergency Medicine

## 2015-04-07 NOTE — Telephone Encounter (Signed)
Contacted patient. Advised of results. For follow up Pap smear in one year with either Dr. Maudie Mercury or here for pap smear.  Negative affirm.  Patient agreeable and verbalized understanding of results and importance of follow up in one year.  Routing to provider for final review. Patient agreeable to disposition. Will close encounter.    08 recall placed.

## 2015-04-07 NOTE — Telephone Encounter (Signed)
-----   Message from Salvadore Dom, MD sent at 04/06/2015 10:29 AM EST ----- 08, with her primary

## 2015-04-09 ENCOUNTER — Ambulatory Visit: Payer: PRIVATE HEALTH INSURANCE | Admitting: Family Medicine

## 2015-04-22 ENCOUNTER — Ambulatory Visit: Payer: Self-pay | Admitting: Family Medicine

## 2015-04-29 ENCOUNTER — Ambulatory Visit: Payer: Self-pay | Admitting: Family Medicine

## 2015-04-30 ENCOUNTER — Emergency Department (HOSPITAL_COMMUNITY): Payer: PRIVATE HEALTH INSURANCE

## 2015-04-30 ENCOUNTER — Emergency Department (HOSPITAL_COMMUNITY)
Admission: EM | Admit: 2015-04-30 | Discharge: 2015-04-30 | Disposition: A | Discharge status: Home / Self Care | Payer: PRIVATE HEALTH INSURANCE | Attending: Emergency Medicine | Admitting: Emergency Medicine

## 2015-04-30 ENCOUNTER — Encounter (HOSPITAL_COMMUNITY): Payer: Self-pay | Admitting: Emergency Medicine

## 2015-04-30 DIAGNOSIS — Z8719 Personal history of other diseases of the digestive system: Secondary | ICD-10-CM | POA: Diagnosis not present

## 2015-04-30 DIAGNOSIS — Z872 Personal history of diseases of the skin and subcutaneous tissue: Secondary | ICD-10-CM | POA: Diagnosis not present

## 2015-04-30 DIAGNOSIS — Z8619 Personal history of other infectious and parasitic diseases: Secondary | ICD-10-CM | POA: Insufficient documentation

## 2015-04-30 DIAGNOSIS — Z79899 Other long term (current) drug therapy: Secondary | ICD-10-CM | POA: Diagnosis not present

## 2015-04-30 DIAGNOSIS — R5383 Other fatigue: Secondary | ICD-10-CM | POA: Insufficient documentation

## 2015-04-30 DIAGNOSIS — Z8639 Personal history of other endocrine, nutritional and metabolic disease: Secondary | ICD-10-CM | POA: Diagnosis not present

## 2015-04-30 DIAGNOSIS — R0789 Other chest pain: Secondary | ICD-10-CM | POA: Diagnosis not present

## 2015-04-30 DIAGNOSIS — Z87442 Personal history of urinary calculi: Secondary | ICD-10-CM | POA: Insufficient documentation

## 2015-04-30 DIAGNOSIS — Z862 Personal history of diseases of the blood and blood-forming organs and certain disorders involving the immune mechanism: Secondary | ICD-10-CM | POA: Insufficient documentation

## 2015-04-30 DIAGNOSIS — Z87440 Personal history of urinary (tract) infections: Secondary | ICD-10-CM | POA: Diagnosis not present

## 2015-04-30 DIAGNOSIS — R002 Palpitations: Secondary | ICD-10-CM | POA: Insufficient documentation

## 2015-04-30 DIAGNOSIS — F419 Anxiety disorder, unspecified: Secondary | ICD-10-CM | POA: Insufficient documentation

## 2015-04-30 DIAGNOSIS — Z7951 Long term (current) use of inhaled steroids: Secondary | ICD-10-CM | POA: Insufficient documentation

## 2015-04-30 DIAGNOSIS — J45901 Unspecified asthma with (acute) exacerbation: Secondary | ICD-10-CM | POA: Insufficient documentation

## 2015-04-30 LAB — I-STAT TROPONIN, ED: Troponin i, poc: 0.01 ng/mL (ref 0.00–0.08)

## 2015-04-30 LAB — BASIC METABOLIC PANEL
Anion gap: 11 (ref 5–15)
BUN: 9 mg/dL (ref 6–20)
CHLORIDE: 105 mmol/L (ref 101–111)
CO2: 24 mmol/L (ref 22–32)
Calcium: 9.4 mg/dL (ref 8.9–10.3)
Creatinine, Ser: 0.75 mg/dL (ref 0.44–1.00)
GFR calc Af Amer: >60 mL/min (ref >60)
GFR calc non Af Amer: >60 mL/min (ref >60)
GLUCOSE: 117 mg/dL — AB (ref 65–99)
POTASSIUM: 4 mmol/L (ref 3.5–5.1)
Sodium: 140 mmol/L (ref 135–145)

## 2015-04-30 LAB — CBC
HEMATOCRIT: 35.6 % — AB (ref 36.0–46.0)
Hemoglobin: 11.9 g/dL — ABNORMAL LOW (ref 12.0–15.0)
MCH: 27 pg (ref 26.0–34.0)
MCHC: 33.4 g/dL (ref 30.0–36.0)
MCV: 80.7 fL (ref 78.0–100.0)
Platelets: 258 10*3/uL (ref 150–400)
RBC: 4.41 MIL/uL (ref 3.87–5.11)
RDW: 14.8 % (ref 11.5–15.5)
WBC: 5.8 10*3/uL (ref 4.0–10.5)

## 2015-04-30 MED ORDER — ACETAMINOPHEN 325 MG PO TABS
650.0000 mg | ORAL_TABLET | Freq: Once | ORAL | Status: AC
  Administered 2015-04-30: 650 mg via ORAL
  Filled 2015-04-30: qty 2

## 2015-04-30 NOTE — ED Notes (Signed)
MD at bedside. 

## 2015-04-30 NOTE — ED Notes (Signed)
To x-ray

## 2015-04-30 NOTE — ED Provider Notes (Signed)
CSN: [id number]     Arrival date & time 04/30/15  [id number] History   First MD Initiated Contact with Patient 04/30/15 [phone]     Chief Complaint  Patient presents with  . Palpitations  . Chest Pain     (Consider location/radiation/quality/duration/timing/severity/associated sxs/prior Treatment) HPI 4 days of intermittent palpitations. These last for several minutes at a time. In association the patient reports chest tightness and shortness of breath. The symptoms resolved when the palpitations resolved. Patient reports generalized fatigue and lack of energy. No fevers or chills. No focal symptoms of URI, cough, vomiting or diarrhea or rashes. Patient denies any medication changes. She does report taking a dietary supplement for hair growth which she's been taking for 6 months. She denies OTC cold preparations. No smoking. Denies change in sleep or dietary patterns. Past Medical History  Diagnosis Date  . Asthma   . Genital warts   . Bronchitis   . UTI (lower urinary tract infection)   . Anxiety   . Depression   . Hyperlipemia   . Acne   . Irritable bowel syndrome   . Allergy     SEASONAL  . Anemia   . GERD (gastroesophageal reflux disease)   . Nephrolithiasis 123XX123    Renal colic of February 123456 -per prior PCP notes  . Gallstones     on [place]  . Abnormal Pap smear of cervix 04/06/2015    -s/p colpo 03/2015 with gyn with repeat pap with HPV advised in 1 year    Past Surgical History  Procedure Laterality Date  . Cesarean section    . Tubal ligation    . Laproscopic      2012  . Endometrial ablation    . Gynecologic cryosurgery    . Cryosurgery cervix     Family History  Problem Relation Age of Onset  . Hypertension Mother   . Hypertension Father   . Hyperlipidemia Father   . Cancer Maternal Aunt    Social History  Substance Use Topics  . Smoking status: Never Smoker   . Smokeless tobacco: Never Used  . Alcohol Use: No   OB History    Gravida Para Term Preterm AB  TAB SAB Ectopic Multiple Living   2 2 1 1      2      Review of Systems 10 Systems reviewed and are negative for acute change except as noted in the HPI.    Allergies  Review of patient's allergies indicates no known allergies.  Home Medications   Prior to Admission medications   Medication Sig Start Date End Date Taking? Authorizing Provider  Biotin 300 MCG TABS Take 300 mcg by mouth daily.   Yes Historical Provider, MD  cetirizine (ZYRTEC) 10 MG tablet Take 10 mg by mouth daily.   Yes Historical Provider, MD  Cholecalciferol (VITAMIN D3) 3000 units TABS Take 1 capsule by mouth daily.    Yes Historical Provider, MD  fluticasone [facility]) 50 MCG/ACT nasal spray use 2 sprays into each nostril once daily 11/17/14  Yes Marletta Lor, MD  Linaclotide [facility]) 145 MCG CAPS capsule Take 1 capsule (145 mcg total) by mouth daily. 10/13/14  Yes Milus Banister, MD  Multiple Vitamin (MULTIVITAMIN WITH MINERALS) TABS Take 1 tablet by mouth daily.   Yes Historical Provider, MD  NON FORMULARY Take 1 tablet by mouth daily. Togi (for hair growth)   Yes Historical Provider, MD  Omega-3 Krill Oil 300 MG CAPS Take 300 mg by mouth daily.  Yes Historical Provider, MD  sertraline (ZOLOFT) 100 MG tablet Take 2 tablets (200 mg total) by mouth daily. 03/09/15  Yes Hannah R Kim, DO   BP 120/80 mmHg  Pulse 88  Temp(Src) 98.8 F (37.1 C) (Oral)  Resp 12  Ht 5\' 2"  (1.575 m)  Wt 155 lb (70.308 kg)  BMI 28.34 kg/m2  SpO2 100%  LMP 04/13/2015 (Exact Date) Physical Exam  Constitutional: She is oriented to person, place, and time. She appears well-developed and well-nourished.  HENT:  Head: Normocephalic and atraumatic.  Eyes: EOM are normal. Pupils are equal, round, and reactive to light.  Neck: Neck supple.  Cardiovascular: Normal rate, regular rhythm, normal heart sounds and intact distal pulses.   Pulmonary/Chest: Effort normal and breath sounds normal.  Abdominal: Soft. Bowel sounds are normal.  She exhibits no distension. There is no tenderness.  Musculoskeletal: Normal range of motion. She exhibits no edema.  Neurological: She is alert and oriented to person, place, and time. She has normal strength. Coordination normal. GCS eye subscore is 4. GCS verbal subscore is 5. GCS motor subscore is 6.  Skin: Skin is warm, dry and intact.  Psychiatric: She has a normal mood and affect.    ED Course  Procedures (including critical care time) Labs Review Labs Reviewed  BASIC METABOLIC PANEL - Abnormal; Notable for the following:    Glucose, Bld 117 (*)    All other components within normal limits  CBC - Abnormal; Notable for the following:    Hemoglobin 11.9 (*)    HCT 35.6 (*)    All other components within normal limits  I-STAT TROPOININ, ED    Imaging Review Dg Chest 2 View  04/30/2015  CLINICAL DATA:  R palpitation, shortness of breath, chest pain EXAM: CHEST  2 VIEW COMPARISON:  None. FINDINGS: The heart size and mediastinal contours are within normal limits. Both lungs are clear. The visualized skeletal structures are unremarkable. IMPRESSION: No active cardiopulmonary disease. Electronically Signed   By: Kathreen Devoid   On: 04/30/2015 10:04   I have personally reviewed and evaluated these images and lab results as part of my medical decision-making.   EKG Interpretation   Date/Time:  Friday April 30 2015 09:06:06 EST Ventricular Rate:  100 PR Interval:  133 QRS Duration: 72 QT Interval:  341 QTC Calculation: 440 R Axis:   42 Text Interpretation:  Sinus tachycardia Abnormal R-wave progression, early  transition Baseline wander in lead(s) II III aVR aVL aVF V1 Agree. No  change from previous. Confirmed by Johnney Killian, MD, Jeannie Done [phone]) on  04/30/2015 9:44:54 AM      MDM   Final diagnoses:  Palpitations   Patient experiencing 4 days of intermittent palpitation. Physical examination is normal. There is no hypotension, hypoxia, tachypnea. EKG shows sinus rhythm without  ischemic changes, no elevation in troponin. At this point I feel the patient is safe for continued outpatient evaluation. She'll be counseled on following up with her physician to discuss Holter monitoring and signs and symptoms for which return. Counseling will be to avoid all over-the-counter medications and any caffeine or other possible stimulants. Signs and symptoms for which to return are reviewed.    Charlesetta Shanks, MD 04/30/15 [phone]

## 2015-04-30 NOTE — Discharge Instructions (Signed)

## 2015-04-30 NOTE — ED Notes (Signed)
To ED via private vehicle-- drove self to ED-- with c/o palpitations and chest tightness that started on Tuesday- nothing precipitated this. States has a hx of similar reaction to anxiety-- not anxious at present, no increased stress level.

## 2015-04-30 NOTE — ED Notes (Signed)
Brought patient to room; patient undressed, in gown, on monitor, continuous pulse oximetry and blood pressure cuff

## 2015-05-04 ENCOUNTER — Ambulatory Visit: Payer: PRIVATE HEALTH INSURANCE

## 2015-05-04 ENCOUNTER — Ambulatory Visit (INDEPENDENT_AMBULATORY_CARE_PROVIDER_SITE_OTHER): Payer: PRIVATE HEALTH INSURANCE | Admitting: Family Medicine

## 2015-05-04 ENCOUNTER — Encounter: Payer: Self-pay | Admitting: *Deleted

## 2015-05-04 ENCOUNTER — Encounter: Payer: Self-pay | Admitting: Family Medicine

## 2015-05-04 VITALS — BP 114/80 | HR 93 | Wt 161.0 lb

## 2015-05-04 VITALS — BP 100/68 | HR 80 | Temp 98.3°F | Ht 62.0 in | Wt 159.4 lb

## 2015-05-04 DIAGNOSIS — G5782 Other specified mononeuropathies of left lower limb: Secondary | ICD-10-CM

## 2015-05-04 DIAGNOSIS — G5762 Lesion of plantar nerve, left lower limb: Secondary | ICD-10-CM | POA: Diagnosis not present

## 2015-05-04 DIAGNOSIS — S92342D Displaced fracture of fourth metatarsal bone, left foot, subsequent encounter for fracture with routine healing: Secondary | ICD-10-CM | POA: Diagnosis not present

## 2015-05-04 DIAGNOSIS — R0789 Other chest pain: Secondary | ICD-10-CM

## 2015-05-04 DIAGNOSIS — R002 Palpitations: Secondary | ICD-10-CM

## 2015-05-04 DIAGNOSIS — S92342A Displaced fracture of fourth metatarsal bone, left foot, initial encounter for closed fracture: Secondary | ICD-10-CM

## 2015-05-04 HISTORY — DX: Lesion of plantar nerve, left lower limb: G57.62

## 2015-05-04 HISTORY — DX: Other specified mononeuropathies of left lower limb: G57.82

## 2015-05-04 MED ORDER — GABAPENTIN 100 MG PO CAPS: 200.0000 mg | ORAL_CAPSULE | Freq: Every day | ORAL | Status: DC

## 2015-05-04 NOTE — Assessment & Plan Note (Signed)
Appears healed at this time. Patient is having a reactive neuroma. We discussed the possibility of injections with patient declined. We discussed icing regimen and home exercises. We discussed proper shoes. We discussed proper lacing. I do think that she will make some improvement. Patient will try the topical anti-inflammatories, icing and we'll see if she improves. If worsening symptoms further imaging will be warranted. Patient will come back in 3 weeks for further evaluation and treatment.

## 2015-05-04 NOTE — Progress Notes (Signed)
HPI:  Alexis [name] is a pleasant 45 yo F with a PMH significant for asthma, anxiety, depression, hyperlipidemia, GERD and anemia here for an acute visit for palpitations. Was evaluated in the emergency department several days ago and was told to follow up here. EKG, troponin, CBC, BMP were unremarkable of evaluation in the ER. Reports: persistent episodes of palpitations several times per day associated with chest tightness and "popping" sensation in chest when they occur and occ lightheaded feeling. Has been under a lot of stress. No panic attacks, depression, sob, wheezing, doe, chest pain with exertion, syncope, nausea, jaw pain.   ROS: See pertinent positives and negatives per HPI.  Past Medical History  Diagnosis Date  . Asthma   . Genital warts   . Bronchitis   . UTI (lower urinary tract infection)   . Anxiety   . Depression   . Hyperlipemia   . Acne   . Irritable bowel syndrome   . Allergy     SEASONAL  . Anemia   . GERD (gastroesophageal reflux disease)   . Nephrolithiasis 123XX123    Renal colic of February 123456 -per prior PCP notes  . Gallstones     on [place]  . Abnormal Pap smear of cervix 04/06/2015    -s/p colpo 03/2015 with gyn with repeat pap with HPV advised in 1 year     Past Surgical History  Procedure Laterality Date  . Cesarean section    . Tubal ligation    . Laproscopic      2012  . Endometrial ablation    . Gynecologic cryosurgery    . Cryosurgery cervix      Family History  Problem Relation Age of Onset  . Hypertension Mother   . Hypertension Father   . Hyperlipidemia Father   . Cancer Maternal Aunt     Social History   Social History  . Marital Status: Divorced    Spouse Name: N/A  . Number of Children: 2  . Years of Education: N/A   Occupational History  . [employer]    Social History Main Topics  . Smoking status: Never Smoker   . Smokeless tobacco: Never Used  . Alcohol Use: No  . Drug Use: No  . Sexual Activity:   Partners: Male    Birth Control/ Protection: Surgical   Other Topics Concern  . None   Social History Narrative     Current outpatient prescriptions:  .  Biotin 300 MCG TABS, Take 300 mcg by mouth daily., Disp: , Rfl:  .  cetirizine (ZYRTEC) 10 MG tablet, Take 10 mg by mouth daily., Disp: , Rfl:  .  Cholecalciferol (VITAMIN D3) 3000 units TABS, Take 1 capsule by mouth daily. , Disp: , Rfl:  .  fluticasone (FLONASE) 50 MCG/ACT nasal spray, use 2 sprays into each nostril once daily, Disp: 16 g, Rfl: 6 .  gabapentin (NEURONTIN) 100 MG capsule, Take 2 capsules (200 mg total) by mouth at bedtime., Disp: 60 capsule, Rfl: 3 .  Linaclotide (LINZESS) 145 MCG CAPS capsule, Take 1 capsule (145 mcg total) by mouth daily., Disp: 30 capsule, Rfl: 11 .  Multiple Vitamin (MULTIVITAMIN WITH MINERALS) TABS, Take 1 tablet by mouth daily., Disp: , Rfl:  .  NON FORMULARY, Take 1 tablet by mouth daily. Togi (for hair growth), Disp: , Rfl:  .  Omega-3 Krill Oil 300 MG CAPS, Take 300 mg by mouth daily. , Disp: , Rfl:  .  sertraline (ZOLOFT) 100 MG tablet, Take  2 tablets (200 mg total) by mouth daily., Disp: 180 tablet, Rfl: 1  EXAM:  Filed Vitals:   05/04/15 1314  BP: 100/68  Pulse: 80  Temp: 98.3 F (36.8 C)    Body mass index is 29.15 kg/(m^2).  GENERAL: vitals reviewed and listed above, alert, oriented, appears well hydrated and in no acute distress  HEENT: atraumatic, conjunttiva clear, no obvious abnormalities on inspection of external nose and ears  NECK: no obvious masses on inspection  LUNGS: clear to auscultation bilaterally, no wheezes, rales or rhonchi, good air movement  CV: HRRR, no peripheral edema  MS: moves all extremities without noticeable abnormality  PSYCH: pleasant and cooperative, no obvious depression or anxiety  ASSESSMENT AND PLAN:  Discussed the following assessment and plan:  Palpitations - Plan: Ambulatory referral to Cardiology  Atypical chest pain - Plan:  Ambulatory referral to Cardiology  -we discussed possible serious and likely etiologies, workup and treatment, treatment risks and return precautions -after this discussion, Myonna opted for evaluation with cardiology as she is quite concerned about her heart and is having some odd symptoms. -of course, we advised Rhyli  to return or notify a doctor immediately if symptoms worsen or persist or new concerns arise.  -Patient advised to return or notify a doctor immediately if symptoms worsen or persist or new concerns arise.  There are no Patient Instructions on file for this visit.   Colin Benton R.

## 2015-05-04 NOTE — Progress Notes (Signed)
Pre visit review using our clinic review tool, if applicable. No additional management support is needed unless otherwise documented below in the visit note. 

## 2015-05-04 NOTE — Progress Notes (Signed)
[name] [address] [place], [place] [address] Phone: [phone] Subjective:      CC: Left foot pain follow-up   [id number] Alexis [name] is a 45 y.o. female coming in with complaint of left foot pain. Patient was found to have a slow healing fourth metatarsal fracture. Patient was put in a postop boot. States that the boot has not helped significant. States that the pain is changed. Now has more of an associated numbness with it. Describes it as a dull, throbbing aching pain more. States that he can even hurt at night or nonweightbearing. States that that pinpoint pain that she was having previously is improved. Rates the severity of this pain is 6 out of 10 because she does not know how to describe it as well.  Past Medical History  Diagnosis Date  . Asthma   . Genital warts   . Bronchitis   . UTI (lower urinary tract infection)   . Anxiety   . Depression   . Hyperlipemia   . Acne   . Irritable bowel syndrome   . Allergy     SEASONAL  . Anemia   . GERD (gastroesophageal reflux disease)   . Nephrolithiasis 123XX123    Renal colic of February 123456 -per prior PCP notes  . Gallstones     on [place]  . Abnormal Pap smear of cervix 04/06/2015    -s/p colpo 03/2015 with gyn with repeat pap with HPV advised in 1 year    Past Surgical History  Procedure Laterality Date  . Cesarean section    . Tubal ligation    . Laproscopic      2012  . Endometrial ablation    . Gynecologic cryosurgery    . Cryosurgery cervix     Social History   Social History  . Marital Status: Divorced    Spouse Name: N/A  . Number of Children: 2  . Years of Education: N/A   Occupational History  . [employer]    Social History Main Topics  . Smoking status: Never Smoker   . Smokeless tobacco: Never Used  . Alcohol Use: No  . Drug Use: No  . Sexual Activity:    Partners: Male    Birth Control/ Protection: Surgical   Other Topics Concern  . Not  on file   Social History Narrative   No Known Allergies Family History  Problem Relation Age of Onset  . Hypertension Mother   . Hypertension Father   . Hyperlipidemia Father   . Cancer Maternal Aunt         Past medical history, social, surgical and family history all reviewed in electronic medical record.   Review of Systems: No headache, visual changes, nausea, vomiting, diarrhea, constipation, dizziness, abdominal pain, skin rash, fevers, chills, night sweats, weight loss, swollen lymph nodes, body aches, joint swelling, muscle aches, chest pain, shortness of breath, mood changes.   Objective Blood pressure 114/80, pulse 93, weight 161 lb (73.029 kg), last menstrual period 04/13/2015, SpO2 98 %.  General: No apparent distress alert and oriented x3 mood and affect normal, dressed appropriately.  HEENT: Pupils equal, extraocular movements intact  Respiratory: Patient's speak in full sentences and does not appear short of breath  Cardiovascular: No lower extremity edema, non tender, no erythema  Skin: Warm dry intact with no signs of infection or rash on extremities or on axial skeleton.  Abdomen: Soft nontender  Neuro: Cranial nerves II through XII are intact,  neurovascularly intact in all extremities with 2+ DTRs and 2+ pulses.  Lymph: No lymphadenopathy of posterior or anterior cervical chain or axillae bilaterally.  Gait normal with good balance and coordination.  MSK:  Non tender with full range of motion and good stability and symmetric strength and tone of shoulders, elbows, wrist, hip, knee and ankles bilaterally.  Foot exam shows no gross deformity. No swelling noted. Patient is not as tender over the fourth metatarsal itself but severely tender between the fourth and fifth toes as well as the third and fourth toes. Positive squeeze test. Neurovascularly intact distally.  Limited musculoskeletal ultrasound was performed and interpreted by Hulan Saas, M  Limited  ultrasound shows patient's cortical defect seems to be resolved at this time. Patient does have 2 new findings. Seems to be more neurologic. Hypoechoic changes between the fourth and fifth and third and fourth interspaces. Consistent with nerve pathology. Impression: Fourth metatarsal fracture appears to be healed the patient has to neuromas between the third and fourth and fourth and fifth interspace.    Impression and Recommendations:     This case required medical decision making of moderate complexity.

## 2015-05-04 NOTE — Patient Instructions (Signed)
Good to see you  The bone is healed now you have a neuroma Continue with the pennsaid and ice every night! Spenco orthotics "total support" online would be great  Rigid sole shoes would be fine but do not lace the last eye of the shoe Gabapentin 100mg  at night then 200mg  thereafter nightly.  See me again in 3 weeks.

## 2015-05-04 NOTE — Patient Instructions (Signed)
We placed a referral for you as discussed to the cardiologist. It usually takes about 1-2 weeks to process and schedule this referral. If you have not heard from [place] regarding this appointment in 2 weeks please contact our office.  In the interim please avoid caffeine and see your counselor for stress management.  Seek care immediately if worsening or new symptoms.

## 2015-05-11 ENCOUNTER — Ambulatory Visit (INDEPENDENT_AMBULATORY_CARE_PROVIDER_SITE_OTHER): Payer: PRIVATE HEALTH INSURANCE | Admitting: Physician Assistant

## 2015-05-11 ENCOUNTER — Encounter: Payer: Self-pay | Admitting: Physician Assistant

## 2015-05-11 VITALS — BP 110/70 | HR 94 | Ht 62.0 in | Wt 161.0 lb

## 2015-05-11 DIAGNOSIS — R002 Palpitations: Secondary | ICD-10-CM | POA: Diagnosis not present

## 2015-05-11 DIAGNOSIS — R0602 Shortness of breath: Secondary | ICD-10-CM | POA: Diagnosis not present

## 2015-05-11 DIAGNOSIS — R0789 Other chest pain: Secondary | ICD-10-CM | POA: Diagnosis not present

## 2015-05-11 LAB — TSH: TSH: 2.06 mIU/L

## 2015-05-11 LAB — MAGNESIUM: MAGNESIUM: 1.9 mg/dL (ref 1.5–2.5)

## 2015-05-11 NOTE — Patient Instructions (Addendum)
Medication Instructions:   NO CHANGES  Labwork: TODAY  TSH  AND  MAG  Testing/Procedures:  Your physician has recommended that you wear a holter monitor. Holter monitors are medical devices that record the heart's electrical activity. Doctors most often use these monitors to diagnose arrhythmias. Arrhythmias are problems with the speed or rhythm of the heartbeat. The monitor is a small, portable device. You can wear one while you do your normal daily activities. This is usually used to diagnose what is causing palpitations/syncope (passing out).  [place] has requested that you have an echocardiogram. Echocardiography is a painless test that uses sound waves to create images of your heart. It provides your doctor with information about the size and shape of your heart and how well your heart's chambers and valves are working. This procedure takes approximately one hour. There are no restrictions for this procedure.  Follow-Up: Your physician recommends that you schedule a follow-up appointment in: 6 - [place]  Any Other Special Instructions Will Be Listed Below (If Applicable).  If you need a refill on your cardiac medications before your next appointment, please call your pharmacy.

## 2015-05-11 NOTE — Progress Notes (Signed)
Cardiology Office Note:    Date:  05/11/2015   ID:  Santa Genera [name], DOB 05/19/1970, MRN [id number]  PCP:  Lucretia Kern., DO  Cardiologist:  New - Dr. Liam Rogers    Referring MD:  Lucretia Kern, DO  Chief Complaint  Patient presents with  . Palpitations    Consult from PCP    History of Present Illness:     Alexis [name] is a 45 y.o. female with a hx of asthma, depression, HL, GERD.  Seen in the emergency room 04/30/15 with complaints of palpitations associated chest discomfort and shortness of breath. Her potassium, creatinine, troponin were normal. Hgb was 11.9. CXR was unremarkable. ECG was unremarkable. She was seen in follow-up by primary care 05/04/15. She is referred to cardiology for further evaluation.  Here today by herself. She started to notice palpitations about 2 weeks ago. She feels as though her heart is fluttering. She also feels a popping sensation. Palpitations last seconds. They sometimes feel rapid. She denies any aggravating or alleviating factors. They do not wake her up. She often feels lightheaded. She had one episode of potential near syncope. She denies syncope. She denies exertional chest discomfort but she has noted some tightness in her chest especially when awakening in the mornings. It does not wake her up. She does note some dyspnea with exertion which is unusual for her. She probably describes NYHA 2b symptoms. She sleeps on 3 pillows chronically secondary to GERD. She denies PND. She has mild pedal edema. She denies pleuritic chest pain or chest pain with lying supine. She denies any symptoms related to her foot injury. She does admit to increased stress but denies heavy caffeine use.   Past Medical History  Diagnosis Date  . Asthma     mild intermittent  . Genital warts   . Bronchitis     states gets once a year  . Anxiety   . Depression   . Hyperlipemia   . Acne   . Irritable bowel syndrome   . Allergy     SEASONAL  . Anemia       menorrhagia  . GERD (gastroesophageal reflux disease)   . Nephrolithiasis [id number]    Renal colic of February [id number] -per prior PCP notes  . Gallstones     on [place]  . Abnormal Pap smear of cervix 04/06/2015    -s/p colpo 03/2015 with gyn with repeat pap with HPV advised in 1 year   . Glucose intolerance (impaired glucose tolerance)     Past Surgical History  Procedure Laterality Date  . Cesarean section    . Tubal ligation    . Laproscopic      2012 - bowel  . Endometrial ablation    . Gynecologic cryosurgery    . Cryosurgery cervix      Current Medications: Outpatient Prescriptions Prior to Visit  Medication Sig Dispense Refill  . Biotin 300 MCG TABS Take 300 mcg by mouth daily.    . cetirizine (ZYRTEC) 10 MG tablet Take 10 mg by mouth daily.    . fluticasone (FLONASE) 50 MCG/ACT nasal spray use 2 sprays into each nostril once daily 16 g 6  . Linaclotide (LINZESS) 145 MCG CAPS capsule Take 1 capsule (145 mcg total) by mouth daily. 30 capsule 11  . Multiple Vitamin (MULTIVITAMIN WITH MINERALS) TABS Take 1 tablet by mouth daily.    . NON FORMULARY Take 1 tablet by mouth daily. Togi (for hair growth)    .  Omega-3 Krill Oil 300 MG CAPS Take 300 mg by mouth daily.     . sertraline (ZOLOFT) 100 MG tablet Take 2 tablets (200 mg total) by mouth daily. 180 tablet 1  . Cholecalciferol (VITAMIN D3) 3000 units TABS Take 1 capsule by mouth daily. Reported on 05/11/2015    . gabapentin (NEURONTIN) 100 MG capsule Take 2 capsules (200 mg total) by mouth at bedtime. (Patient not taking: Reported on 05/11/2015) 60 capsule 3   No facility-administered medications prior to visit.     Allergies:   Review of patient's allergies indicates no known allergies.   Social History   Social History  . Marital Status: Divorced    Spouse Name: N/A  . Number of Children: 2  . Years of Education: N/A   Occupational History  . [employer] [place], [place] History Main Topics  . Smoking  status: Never Smoker   . Smokeless tobacco: Never Used  . Alcohol Use: No  . Drug Use: No  . Sexual Activity:    Partners: Male    Birth Control/ Protection: Surgical   Other Topics Concern  . 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 There is no history of Heart attack or Heart failure.   ROS:   Please see the history of present illness.    Review of Systems  Constitution: Positive for chills and malaise/fatigue.  Cardiovascular: Positive for chest pain, dyspnea on exertion and irregular heartbeat.  Gastrointestinal: Positive for constipation.  Neurological: Positive for dizziness.  Psychiatric/Behavioral: Positive for depression. The patient is nervous/anxious.   All other systems reviewed and are negative.   Physical Exam:    VS:  BP 110/70 mmHg  Pulse 94  Ht 5\' 2"  (1.575 m)  Wt 161 lb (73.029 kg)  BMI 29.44 kg/m2  LMP 04/13/2015 (Exact Date)   GEN: Well nourished, well developed, in no acute distress HEENT: normal Neck: no JVD, no masses Cardiac: Normal S1/S2, RRR; no murmurs, rubs, or gallops, no edema;  no carotid bruits,   Respiratory:  clear to auscultation bilaterally; no wheezing, rhonchi or rales GI: soft, nontender, nondistended, + BS MS: no deformity or atrophy Skin: warm and dry, no rash Neuro:  Bilateral strength equal, no focal deficits  Psych: Alert and oriented x 3, normal affect  Wt Readings from Last 3 Encounters:  05/11/15 161 lb (73.029 kg)  05/04/15 159 lb 6.4 oz (72.303 kg)  05/04/15 161 lb (73.029 kg)      Studies/Labs Reviewed:     EKG:  EKG is  ordered today.  The ekg ordered today demonstrates NSR, HR 95, nonspecific ST-T wave changes, QTc 447 ms  Recent  Labs: 05/18/2014: ALT 19 04/30/2015: BUN 9; Creatinine, Ser 0.75; Hemoglobin 11.9*; Platelets 258; Potassium 4.0; Sodium 140   Recent Lipid Panel    Component Value Date/Time   CHOL 232* 03/09/2015 1552   TRIG 82.0 03/09/2015 1552   HDL 43.20 03/09/2015 1552   CHOLHDL 5 03/09/2015 1552   VLDL 16.4 03/09/2015 1552   LDLCALC 172* 03/09/2015 1552   LDLDIRECT 153.8 09/25/2012 0858    Additional studies/ records that were reviewed today include:   None  ASSESSMENT:     1. Palpitations   2. SOB (shortness of breath)   3. Other chest pain     PLAN:     In order of problems listed above:  1. Palpitations - Etiology unclear.  These sound like PACs vs PVCs.  She does not drink a lot of caffeine.  She had a marginally low TSH in 2015. HR is higher than in the past.  She notes a lot of fatigue. She does note DOE which is unusual for her.  No signs of CHF on exam.  ECG is normal.  No evidence of pre-excitation.  She denies a hx of syncope.  Recent K+ normal.  -  Labs today: TSH, Mg2+  -  Echocardiogram  -  48 Hr Holter >> if PAC/PVCs identified >> start low dose beta-blocker to see if this helps.  2. Shortness of breath - Etiology not clear.  Will get echo and Holter as noted.  Consider further testing if symptoms continue.  3. Chest pain - Atypical.  Best test would be an ETT.  But, she cannot walk on a treadmill with her recent foot fracture.  Discussed with Dr. Liam Rogers.  Will get echo and Holter to start.  We can consider stress testing in the future if symptoms continue.      Medication Adjustments/Labs and Tests Ordered: Current medicines are reviewed at length with the patient today.  Concerns regarding medicines are outlined above.  Medication changes, Labs and Tests ordered today are outlined in the Patient Instructions noted below. Patient Instructions  Medication Instructions:   NO CHANGES  Labwork: TODAY  TSH  AND  MAG  Testing/Procedures:  Your physician has  recommended that you wear a holter monitor. Holter monitors are medical devices that record the heart's electrical activity. Doctors most often use these monitors to diagnose arrhythmias. Arrhythmias are problems with the speed or rhythm of the heartbeat. The monitor is a small, portable device. You can wear one while you do your normal daily activities. This is usually used to diagnose what is causing palpitations/syncope (passing out).  [place] has requested that you have an echocardiogram. Echocardiography is a painless test that uses sound waves to create images of your heart. It provides your doctor with information about the size and shape of your heart and how well your heart's chambers and valves are working. This procedure takes approximately one hour. There are no restrictions for this procedure.  Follow-Up: Your physician recommends that you schedule a follow-up appointment in: 6 - [place]  Any Other Special Instructions Will Be Listed Below (If Applicable).  If you need a refill on your cardiac medications before your next appointment, please call your pharmacy.     Signed, Richardson Dopp, PA-C  05/11/2015 9:00 AM    [facility] [place], [place], [place]  [address] Phone: [phone]; Fax: [phone]   Attending Note:   The patient was seen and examined.  Agree with assessment and plan as noted above.  Changes made to the above note as needed.  Will get a 48 hr holter .   Start BB if she has significant symptomatic arrhythmias.    Thayer Headings, Brooke Bonito., MD, [facility] 05/11/2015, 6:53 PM [id number] [address]  [place] Pager [phone]

## 2015-05-12 ENCOUNTER — Telehealth: Payer: Self-pay | Admitting: *Deleted

## 2015-05-12 NOTE — Telephone Encounter (Signed)
Patient given results and confirmed ECHO appt. 2/14

## 2015-05-18 ENCOUNTER — Ambulatory Visit (HOSPITAL_COMMUNITY): Payer: PRIVATE HEALTH INSURANCE | Attending: Physician Assistant

## 2015-05-18 ENCOUNTER — Other Ambulatory Visit: Payer: Self-pay

## 2015-05-18 ENCOUNTER — Ambulatory Visit (INDEPENDENT_AMBULATORY_CARE_PROVIDER_SITE_OTHER): Payer: PRIVATE HEALTH INSURANCE

## 2015-05-18 DIAGNOSIS — R0602 Shortness of breath: Secondary | ICD-10-CM | POA: Insufficient documentation

## 2015-05-18 DIAGNOSIS — I071 Rheumatic tricuspid insufficiency: Secondary | ICD-10-CM | POA: Insufficient documentation

## 2015-05-18 DIAGNOSIS — I517 Cardiomegaly: Secondary | ICD-10-CM | POA: Insufficient documentation

## 2015-05-18 DIAGNOSIS — R002 Palpitations: Secondary | ICD-10-CM | POA: Diagnosis present

## 2015-05-18 DIAGNOSIS — E785 Hyperlipidemia, unspecified: Secondary | ICD-10-CM | POA: Diagnosis not present

## 2015-05-18 DIAGNOSIS — I5189 Other ill-defined heart diseases: Secondary | ICD-10-CM | POA: Insufficient documentation

## 2015-05-18 DIAGNOSIS — I340 Nonrheumatic mitral (valve) insufficiency: Secondary | ICD-10-CM | POA: Diagnosis not present

## 2015-05-19 ENCOUNTER — Telehealth: Payer: Self-pay | Admitting: *Deleted

## 2015-05-19 ENCOUNTER — Encounter: Payer: Self-pay | Admitting: Physician Assistant

## 2015-05-19 NOTE — Telephone Encounter (Signed)
New message  Pt called back states that she would like to speak to the cardiologist to further discuss her results from the ECHO. Please assist

## 2015-05-19 NOTE — Telephone Encounter (Signed)
Pt has been notified of echo results by phone with verbal understanding 

## 2015-05-20 ENCOUNTER — Telehealth: Payer: Self-pay | Admitting: Physician Assistant

## 2015-05-20 DIAGNOSIS — I5189 Other ill-defined heart diseases: Secondary | ICD-10-CM

## 2015-05-20 MED ORDER — HYDROCHLOROTHIAZIDE 12.5 MG PO CAPS: ORAL_CAPSULE | ORAL | Status: DC

## 2015-05-20 NOTE — Telephone Encounter (Signed)
I called patient back about her echo. She is concerned about her DOE and the mild diastolic dysfunction on her echo. Will try low dose diuretic to see if this helps. Start HCTZ 12.5 mg on Mon, Wed, Fri. Please arrange BMET in 2 weeks - please call patient with the lab visit. I sent Rx to her pharmacy.  Have patient call us to tell us how her symptoms are after taking HCTZ for 2 weeks.

## 2015-05-20 NOTE — Telephone Encounter (Signed)
I cb pt to see if I could answer her questions. Pt has questions about what mild focal basal hypertrophy of the septum, which pt said she knows means thickening. Grade 1 diastolic dysfunction. I explained to pt about the diastolic dysfunction was. Pt said she understands better however she still wants Richardson Dopp, [place] to call her as well. I said we had clinic right now however I will let him know of her concerns. Pt said thank you.

## 2015-05-24 NOTE — Addendum Note (Signed)
Addended by: Michae Kava on: 05/24/2015 08:34 AM   Modules accepted: Orders

## 2015-05-24 NOTE — Telephone Encounter (Signed)
Pt has been scheduled for bmet 2/3 per [employer]. PA. Pt also advised to call in a couple of weeks as to how she is feeling after being on HCTZ 12.5 mg daily. Pt verbalized understanding to plan of care.

## 2015-05-25 ENCOUNTER — Telehealth: Payer: Self-pay | Admitting: Cardiovascular Disease

## 2015-05-25 ENCOUNTER — Ambulatory Visit: Payer: Self-pay | Admitting: Family Medicine

## 2015-05-25 NOTE — Telephone Encounter (Signed)
Returning call from yesterday evening.

## 2015-05-25 NOTE — Telephone Encounter (Signed)
See 05/18/15 holter monitor results.

## 2015-05-26 ENCOUNTER — Telehealth: Payer: Self-pay | Admitting: Nurse Practitioner

## 2015-05-26 DIAGNOSIS — I519 Heart disease, unspecified: Secondary | ICD-10-CM

## 2015-05-26 NOTE — Telephone Encounter (Signed)
Spoke with patient regarding holter results and advice from Dr. Acie Fredrickson.  She states she is having frequent episodes of palpitations throughout the day and night.  She states these are bothersome and wants to know about medications that could be prescribed.  I advised her that I will discuss with Dr. Acie Fredrickson regarding whether or not beta blocker could be prescribed.  This was mentioned by Richardson Dopp, PA at office visit on 2/7.  She started HCTZ on 2/16 for DOE and mild diastolic dysfunction as seen on echo.  She states she does not note any change in symptoms from medication other than urinating more frequently.  She is due for repeat bmet on 3/3. I advised her I will send to Dr. Acie Fredrickson for advice and call her back later.  She verbalized understanding and agreement.

## 2015-05-26 NOTE — Telephone Encounter (Signed)
Left message for patient to call office for advice

## 2015-05-26 NOTE — Telephone Encounter (Signed)
-----   Message from Thayer Headings, MD sent at 05/24/2015  4:09 PM EST ----- NSR with episodes of bradycardia

## 2015-05-26 NOTE — Telephone Encounter (Signed)
We can try propranolol 10 mg QID PRN to see if that helps

## 2015-05-27 ENCOUNTER — Telehealth: Payer: Self-pay | Admitting: Cardiovascular Disease

## 2015-05-27 MED ORDER — PROPRANOLOL HCL 10 MG PO TABS: 10.0000 mg | ORAL_TABLET | Freq: Four times a day (QID) | ORAL | Status: DC | PRN

## 2015-05-27 NOTE — Telephone Encounter (Signed)
Agree with obtaining CMET and FU with PCP. Richardson Dopp, PA-C   05/27/2015 4:36 PM

## 2015-05-27 NOTE — Telephone Encounter (Signed)
Follow up ° ° ° ° ° °Returning a call to the nurse °

## 2015-05-27 NOTE — Telephone Encounter (Signed)
Follow up ° ° °Pt returning rn call °

## 2015-05-27 NOTE — Telephone Encounter (Signed)
Spoke with patient and advised her that palpitations are benign and to be reassured that these are not dangerous per Dr. Acie Fredrickson.  He advised that she may take Propranolol 10 mg for symptoms if palpitations are especially bothersome.  I advised her to monitor heart rate and BP while taking this medication and to d/c if she experiences dizziness, light-headedness, or a feeling like she is going to pass out.  She proceeded to ask if HCTZ causes abdominal bloating.  She states that since Saturday she has noticed significant swelling in her abdomen.  She is taking HCTZ 3 times per week as directed by Richardson Dopp, PA.  She denies pain, discoloration of skin or eyes, denies decreased urinary output.  States her abdomen is more firm than soft and she does not note any swelling anywhere else.  I advised her to come in for bmet tomorrow instead of next Friday so that we can access liver and kidney function.  She states she is unable to come in for lab work today due to working until 9 pm.  She states she took Adult nurse X today without improvement.  I am changing the order from basic to complete metabolic panel.  I advised her that we will follow-up with her after lab results are available.  She verbalized understanding and agreement with plan of care.  I am routing message to Dr. Acie Fredrickson and Richardson Dopp for additional advice.

## 2015-05-27 NOTE — Telephone Encounter (Signed)
Left message for patient to call back  

## 2015-05-27 NOTE — Telephone Encounter (Signed)
Agree with note by Christen Bame, RN. A CMET would be useful She may need to see her medical doctor / urgent care for her abdominal bloating

## 2015-05-28 ENCOUNTER — Other Ambulatory Visit (INDEPENDENT_AMBULATORY_CARE_PROVIDER_SITE_OTHER): Payer: PRIVATE HEALTH INSURANCE | Admitting: *Deleted

## 2015-05-28 DIAGNOSIS — I519 Heart disease, unspecified: Secondary | ICD-10-CM | POA: Diagnosis not present

## 2015-05-29 LAB — COMPREHENSIVE METABOLIC PANEL
ALK PHOS: 49 U/L (ref 33–115)
ALT: 21 U/L (ref 6–29)
AST: 19 U/L (ref 10–30)
Albumin: 4 g/dL (ref 3.6–5.1)
BILIRUBIN TOTAL: 0.7 mg/dL (ref 0.2–1.2)
BUN: 11 mg/dL (ref 7–25)
CALCIUM: 9.1 mg/dL (ref 8.6–10.2)
CO2: 22 mmol/L (ref 20–31)
Chloride: 100 mmol/L (ref 98–110)
Creat: 0.74 mg/dL (ref 0.50–1.10)
GLUCOSE: 87 mg/dL (ref 65–99)
POTASSIUM: 3.9 mmol/L (ref 3.5–5.3)
Sodium: 139 mmol/L (ref 135–146)
TOTAL PROTEIN: 7.5 g/dL (ref 6.1–8.1)

## 2015-05-31 NOTE — Telephone Encounter (Signed)
Reviewed cmet results with patient who verbalized understanding.  She states she is feeling better today and has less abdominal swelling.  I advised her to continue current medications and plan for follow-up and to call her PCP if symptoms persist.  She verbalized understanding and agreement and thanked me for the call.

## 2015-06-02 ENCOUNTER — Ambulatory Visit: Payer: PRIVATE HEALTH INSURANCE | Admitting: Family Medicine

## 2015-06-04 ENCOUNTER — Other Ambulatory Visit: Payer: Self-pay

## 2015-06-15 ENCOUNTER — Ambulatory Visit: Payer: Self-pay | Admitting: Family Medicine

## 2015-06-17 ENCOUNTER — Ambulatory Visit (INDEPENDENT_AMBULATORY_CARE_PROVIDER_SITE_OTHER): Payer: Self-pay | Admitting: Family Medicine

## 2015-06-17 DIAGNOSIS — K589 Irritable bowel syndrome without diarrhea: Secondary | ICD-10-CM | POA: Insufficient documentation

## 2015-06-17 DIAGNOSIS — J302 Other seasonal allergic rhinitis: Secondary | ICD-10-CM | POA: Insufficient documentation

## 2015-06-17 DIAGNOSIS — R69 Illness, unspecified: Secondary | ICD-10-CM

## 2015-06-17 NOTE — Progress Notes (Signed)
NO SHOW

## 2015-06-29 ENCOUNTER — Ambulatory Visit: Payer: Self-pay | Admitting: Family Medicine

## 2015-07-06 ENCOUNTER — Ambulatory Visit (INDEPENDENT_AMBULATORY_CARE_PROVIDER_SITE_OTHER): Payer: PRIVATE HEALTH INSURANCE | Admitting: Family Medicine

## 2015-07-06 ENCOUNTER — Ambulatory Visit (INDEPENDENT_AMBULATORY_CARE_PROVIDER_SITE_OTHER): Payer: PRIVATE HEALTH INSURANCE | Admitting: Cardiovascular Disease

## 2015-07-06 ENCOUNTER — Encounter: Payer: Self-pay | Admitting: Family Medicine

## 2015-07-06 ENCOUNTER — Encounter: Payer: Self-pay | Admitting: Cardiovascular Disease

## 2015-07-06 VITALS — BP 100/70 | HR 80 | Ht 62.0 in | Wt 158.8 lb

## 2015-07-06 VITALS — BP 100/62 | HR 92 | Temp 98.7°F | Ht 62.0 in | Wt 159.1 lb

## 2015-07-06 DIAGNOSIS — F3342 Major depressive disorder, recurrent, in full remission: Secondary | ICD-10-CM

## 2015-07-06 DIAGNOSIS — E663 Overweight: Secondary | ICD-10-CM | POA: Diagnosis not present

## 2015-07-06 DIAGNOSIS — I10 Essential (primary) hypertension: Secondary | ICD-10-CM

## 2015-07-06 DIAGNOSIS — R002 Palpitations: Secondary | ICD-10-CM | POA: Diagnosis not present

## 2015-07-06 DIAGNOSIS — I519 Heart disease, unspecified: Secondary | ICD-10-CM | POA: Diagnosis not present

## 2015-07-06 DIAGNOSIS — I493 Ventricular premature depolarization: Secondary | ICD-10-CM | POA: Insufficient documentation

## 2015-07-06 DIAGNOSIS — E785 Hyperlipidemia, unspecified: Secondary | ICD-10-CM

## 2015-07-06 DIAGNOSIS — I5189 Other ill-defined heart diseases: Secondary | ICD-10-CM

## 2015-07-06 LAB — LIPID PANEL
CHOL/HDL RATIO: 6
Cholesterol: 257 mg/dL — ABNORMAL HIGH (ref 0–200)
HDL: 41.7 mg/dL (ref >39.00)
LDL CALC: 195 mg/dL — AB (ref 0–99)
NonHDL: 215.23
TRIGLYCERIDES: 99 mg/dL (ref 0.0–149.0)
VLDL: 19.8 mg/dL (ref 0.0–40.0)

## 2015-07-06 MED ORDER — SERTRALINE HCL 100 MG PO TABS: 200.0000 mg | ORAL_TABLET | Freq: Every day | ORAL | Status: DC

## 2015-07-06 MED ORDER — HYDROCHLOROTHIAZIDE 12.5 MG PO CAPS: ORAL_CAPSULE | ORAL | Status: DC

## 2015-07-06 MED ORDER — POTASSIUM CHLORIDE ER 10 MEQ PO TBCR: 10.0000 meq | EXTENDED_RELEASE_TABLET | Freq: Every day | ORAL | Status: DC

## 2015-07-06 NOTE — Patient Instructions (Signed)
Medication Instructions:  TAKE HCTZ 2 days per week TAKE Kdur 10 meq on the same 2 days per week   Labwork: None Ordered   Testing/Procedures: None Ordered   Follow-Up: Your physician wants you to follow-up in: 1 year with Dr. Acie Fredrickson.  You will receive a reminder letter in the mail two months in advance. If you don't receive a letter, please call our office to schedule the follow-up appointment.   If you need a refill on your cardiac medications before your next appointment, please call your pharmacy.   Thank you for choosing [facility]! Christen Bame, RN [phone]

## 2015-07-06 NOTE — Progress Notes (Signed)
Pre visit review using our clinic review tool, if applicable. No additional management support is needed unless otherwise documented below in the visit note. 

## 2015-07-06 NOTE — Progress Notes (Signed)
Cardiology Office Note:    Date:  07/06/2015   ID:  Alexis [name], DOB 19-Apr-1970, MRN [id number]  PCP:  Lucretia Kern., DO  Cardiologist:  New - Dr. Liam Rogers    Referring MD:  Lucretia Kern, DO  Chief Complaint  Patient presents with  . Follow-up    palpitations    History of Present Illness:     Alexis [name] is a 45 y.o. female with a hx of asthma, depression, HL, GERD.  Seen in the emergency room 04/30/15 with complaints of palpitations associated chest discomfort and shortness of breath. Her potassium, creatinine, troponin were normal. Hgb was 11.9. CXR was unremarkable. ECG was unremarkable. She was seen in follow-up by primary care 05/04/15. She is referred to cardiology for further evaluation.  Here today by herself. She started to notice palpitations about 2 weeks ago. She feels as though her heart is fluttering. She also feels a popping sensation. Palpitations last seconds. They sometimes feel rapid. She denies any aggravating or alleviating factors. They do not wake her up. She often feels lightheaded. She had one episode of potential near syncope. She denies syncope. She denies exertional chest discomfort but she has noted some tightness in her chest especially when awakening in the mornings. It does not wake her up. She does note some dyspnea with exertion which is unusual for her. She probably describes NYHA 2b symptoms. She sleeps on 3 pillows chronically secondary to GERD. She denies PND. She has mild pedal edema. She denies pleuritic chest pain or chest pain with lying supine. She denies any symptoms related to her foot injury. She does admit to increased stress but denies heavy caffeine use.  July 06, 2015: Alexis [name] is seen today for follow up .   Was previously seen by Richardson Dopp for palpitations . 56  Hr Holter showed NSR with PVCs Echo shows normal LV systolic function with grade 1 diastolic dysfunction    Past Medical History  Diagnosis Date  .  Asthma     mild intermittent  . Genital warts   . Bronchitis     states gets once a year  . Anxiety   . Depression   . Hyperlipemia   . Acne   . Irritable bowel syndrome   . Allergy     SEASONAL  . Anemia     menorrhagia  . GERD (gastroesophageal reflux disease)   . Nephrolithiasis 123XX123    Renal colic of February 123456 -per prior PCP notes  . Gallstones     on [place]  . Abnormal Pap smear of cervix 04/06/2015    -s/p colpo 03/2015 with gyn with repeat pap with HPV advised in 1 year   . Glucose intolerance (impaired glucose tolerance)   . History of echocardiogram     a. Echo 2/17: Mild focal basal septal hypertrophy, EF 55-60%, normal wall motion, grade 1 diastolic dysfunction, trace MR, trace TR    Past Surgical History  Procedure Laterality Date  . Cesarean section    . Tubal ligation    . Laproscopic      2012 - bowel  . Endometrial ablation    . Gynecologic cryosurgery    . Cryosurgery cervix      Current Medications: Outpatient Prescriptions Prior to Visit  Medication Sig Dispense Refill  . Biotin 300 MCG TABS Take 300 mcg by mouth daily.    . cetirizine (ZYRTEC) 10 MG tablet Take 10 mg by mouth daily.    . Cholecalciferol (  VITAMIN D3 PO) Take 4,000 Units by mouth daily.    . fluticasone (FLONASE) 50 MCG/ACT nasal spray use 2 sprays into each nostril once daily 16 g 6  . gabapentin (NEURONTIN) 100 MG capsule Take 100 mg by mouth daily.    . hydrochlorothiazide (MICROZIDE) 12.5 MG capsule Take one cap by mouth every Monday, Wednesday, Friday 30 capsule 3  . Linaclotide (LINZESS) 145 MCG CAPS capsule Take 1 capsule (145 mcg total) by mouth daily. 30 capsule 11  . Multiple Vitamin (MULTIVITAMIN WITH MINERALS) TABS Take 1 tablet by mouth daily.    . NON FORMULARY Take 1 tablet by mouth daily. Togi (for hair growth)    . Omega-3 Krill Oil 300 MG CAPS Take 300 mg by mouth daily.     . propranolol (INDERAL) 10 MG tablet Take 1 tablet (10 mg total) by mouth 4 (four) times  daily as needed. 60 tablet 6  . sertraline (ZOLOFT) 100 MG tablet Take 2 tablets (200 mg total) by mouth daily. 180 tablet 1   No facility-administered medications prior to visit.     Allergies:   Review of patient's allergies indicates no known allergies.   Social History   Social History  . Marital Status: Divorced    Spouse Name: N/A  . Number of Children: 2  . Years of Education: N/A   Occupational History  . Emergency [place], [place] History Main Topics  . Smoking status: Never Smoker   . Smokeless tobacco: Never Used  . Alcohol Use: No  . Drug Use: No  . Sexual Activity:    Partners: Male    Birth Control/ Protection: Surgical   Other Topics Concern  . 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 There is no history of Heart attack or Heart failure.   ROS:   Please see the history of present illness.    Review of Systems  Constitution: Positive for chills and malaise/fatigue.  Cardiovascular: Positive for chest pain, dyspnea on exertion and irregular heartbeat.  Gastrointestinal: Positive for constipation.  Neurological: Positive for dizziness.  Psychiatric/Behavioral: Positive for depression. The patient is nervous/anxious.   All other systems reviewed and are negative.   Physical Exam:    VS:  BP 100/70 mmHg  Pulse 80  Ht 5\' 2"  (1.575 m)  Wt 158 lb 12.8 oz (72.031 kg)  BMI 29.04 kg/m2   GEN: Well nourished, well developed, in no acute distress HEENT: normal Neck: no JVD, no masses Cardiac: Normal S1/S2, RRR; no murmurs, rubs, or gallops, no edema;  no carotid bruits,   Respiratory:  clear to auscultation bilaterally; no wheezing, rhonchi or rales GI: soft, nontender,  nondistended, + BS MS: no deformity or atrophy Skin: warm and dry, no rash Neuro:  Bilateral strength equal, no focal deficits  Psych: Alert and oriented x 3, normal affect  Wt Readings from Last 3 Encounters:  07/06/15 158 lb 12.8 oz (72.031 kg)  05/11/15 161 lb (73.029 kg)  05/04/15 159 lb 6.4 oz (72.303 kg)      Studies/Labs Reviewed:     EKG:  EKG is  ordered today.  The ekg ordered today demonstrates NSR, HR  95, nonspecific ST-T wave changes, QTc 447 ms  Recent Labs: 04/30/2015: Hemoglobin 11.9*; Platelets 258 05/11/2015: Magnesium 1.9; TSH 2.06 05/28/2015: ALT 21; BUN 11; Creat 0.74; Potassium 3.9; Sodium 139   Recent Lipid Panel    Component Value Date/Time   CHOL 232* 03/09/2015 1552   TRIG 82.0 03/09/2015 1552   HDL 43.20 03/09/2015 1552   CHOLHDL 5 03/09/2015 1552   VLDL 16.4 03/09/2015 1552   LDLCALC 172* 03/09/2015 1552   LDLDIRECT 153.8 09/25/2012 0858    Additional studies/ records that were reviewed today include:   None    ASSESSMENT:     No diagnosis found.  PLAN:     In order of problems listed above:  1. Palpitations -  Echo shows normal LV systolic function .  Has grade 1 diastolic dysfunction . 48 Holter monitor - showed NSR with PVCs.  Ive reassured her that these are benign    2. Shortness of breath - Etiology not clear. tinue.  3. Hand and foot Swelling :   Was started on HCTZ 12.5 mg three times a week for hand and feet swelling . This has made her feel washed out.  I informed her that I do not typically treat benign edema  - for this very reason  Will reduce to twice a week,  Will add Kdur 10 meq twice a week. She will follow up with her Primary MD  I will see her in 1 year - sooner if needed.    Thayer Headings, Brooke Bonito., MD, [facility] 07/06/2015, 8:33 AM [address]  [place] Pager [phone]

## 2015-07-06 NOTE — Progress Notes (Signed)
Joaquim Lai thank you Not sure I like these present daily and HPI:  MDD: -on zoloft with prior PCP - [facility] -sees Lavell Anchors for CBT -chronic stress related to her job, this has improved, as she cut back on the hours at the job she did not like -denies: SI, thoughts of self harm  IBS/gallstones: -sees GI, Dr. Ardis Hughs -meds: linzess  Seasonal allergies/MIA: -meds zyrtec and nasonex -denies: fevers, sinus pain, tooth pain, SOB, NVD  HLD: -fasting today -Has improved her diet some, eating more veggies and less red meet -Not exercising  Palpitations/HTN: -s/p evaluation with cardiology -meds: hctz 12.5, propranolol 10 -Reports is doing better  ROS: See pertinent positives and negatives per HPI.  Past Medical History  Diagnosis Date  . Asthma     mild intermittent  . Genital warts   . Bronchitis     states gets once a year  . Anxiety   . Depression   . Hyperlipemia   . Acne   . Irritable bowel syndrome   . Allergy     SEASONAL  . Anemia     menorrhagia  . GERD (gastroesophageal reflux disease)   . Nephrolithiasis 123XX123    Renal colic of February 123456 -per prior PCP notes  . Gallstones     on [place]  . Abnormal Pap smear of cervix 04/06/2015    -s/p colpo 03/2015 with gyn with repeat pap with HPV advised in 1 year   . Glucose intolerance (impaired glucose tolerance)   . History of echocardiogram     a. Echo 2/17: Mild focal basal septal hypertrophy, EF 55-60%, normal wall motion, grade 1 diastolic dysfunction, trace MR, trace TR    Past Surgical History  Procedure Laterality Date  . Cesarean section    . Tubal ligation    . Laproscopic      2012 - bowel  . Endometrial ablation    . Gynecologic cryosurgery    . Cryosurgery cervix      Family History  Problem Relation Age of Onset  . Hypertension Mother   . Hypertension Father   . Hyperlipidemia Father   . Cancer Maternal Aunt   . Heart attack Neg Hx   . Heart  failure Neg Hx     Social History   Social History  . Marital Status: Divorced    Spouse Name: N/A  . Number of Children: 2  . Years of Education: N/A   Occupational History  . Emergency [place], [place] History Main Topics  . Smoking status: Never Smoker   . Smokeless tobacco: Never Used  . Alcohol Use: No  . Drug Use: No  . Sexual Activity:    Partners: Male    Birth Control/ Protection: Surgical   Other Topics Concern  . 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  Biotin 300 MCG TABS, Take 300 mcg by mouth daily., Disp: , Rfl:  .  cetirizine (ZYRTEC) 10 MG tablet, Take 10 mg by mouth daily., Disp: , Rfl:  .  Cholecalciferol (VITAMIN D3 PO), Take 4,000 Units by mouth daily., Disp: , Rfl:  .  fluticasone (FLONASE) 50 MCG/ACT nasal spray, use 2 sprays into each nostril once daily, Disp: 16 g, Rfl: 6 .  gabapentin (NEURONTIN) 100 MG capsule, Take 100 mg by mouth daily., Disp: , Rfl:  .  hydrochlorothiazide (MICROZIDE) 12.5 MG capsule, Take one cap by mouth every Monday and Thursday, Disp: 30 capsule, Rfl: 3 .  Multiple Vitamin (MULTIVITAMIN WITH MINERALS) TABS, Take 1 tablet by mouth daily., Disp: , Rfl:  .  NON FORMULARY, Take 1 tablet by mouth daily. Togi (for hair growth), Disp: , Rfl:  .  Omega-3 Krill Oil 300 MG CAPS, Take 300 mg by mouth daily. , Disp: , Rfl:  .  potassium chloride (K-DUR) 10 MEQ tablet, Take 1 tablet (10 mEq total) by mouth daily., Disp: 30 tablet, Rfl: 3 .  propranolol (INDERAL) 10 MG tablet, Take 1 tablet (10 mg total) by mouth 4 (four) times daily as needed., Disp: 60 tablet, Rfl: 6 .  sertraline (ZOLOFT) 100 MG tablet, Take 2 tablets (200 mg total) by mouth daily., Disp: 180 tablet, Rfl: 3  EXAM:  Filed Vitals:   07/06/15 0920  BP: 100/62  Pulse: 92  Temp: 98.7 F (37.1 C)     Body mass index is 29.09 kg/(m^2).  GENERAL: vitals reviewed and listed above, alert, oriented, appears well hydrated and in no acute distress  HEENT: atraumatic, conjunttiva clear, no obvious abnormalities on inspection of external nose and ears  NECK: no obvious masses on inspection  LUNGS: clear to auscultation bilaterally, no wheezes, rales or rhonchi, good air movement  CV: HRRR, no peripheral edema  MS: moves all extremities without noticeable abnormality  PSYCH: pleasant and cooperative, no obvious depression or anxiety  ASSESSMENT AND PLAN:  Discussed the following assessment and plan:  Hyperlipemia - Plan: Lipid Panel  Palpitations  Essential hypertension  Overweight  Recurrent major depressive disorder, in full remission ([place])  -lipid panel, statin unless significant improvement -she agrees to starting Crestor, thought that simvastatin in the past caused night sweats -Lifestyle recommendations, congratulated on diet changes and advised regular aerobic exercise  -Refills per her request  -Patient advised to return or notify a doctor immediately if symptoms worsen or persist or new concerns arise.  Patient Instructions  Before you leave: -Labs -Schedule follow-up in 3-4 months  We recommend the following healthy lifestyle measures: - eat a healthy whole foods diet consisting of regular small meals composed of vegetables, fruits, beans, nuts, seeds, healthy meats such as white chicken and fish and whole grains.  - avoid sweets, white starchy foods, fried foods, fast food, processed foods, sodas, red meet and other fattening foods.  - get a least 150-300 minutes of aerobic exercise per week.   -We have ordered labs or studies at this visit. It can take up to 1-2 weeks for results and processing. We will contact you with instructions IF your results are abnormal. Normal results will be released to your [facility]. If you have not heard from [place] or can not find your  results in [facility] in 2 weeks please contact our office.            Colin Benton R.

## 2015-07-06 NOTE — Patient Instructions (Signed)
Before you leave: -Labs -Schedule follow-up in 3-4 months  We recommend the following healthy lifestyle measures: - eat a healthy whole foods diet consisting of regular small meals composed of vegetables, fruits, beans, nuts, seeds, healthy meats such as white chicken and fish and whole grains.  - avoid sweets, white starchy foods, fried foods, fast food, processed foods, sodas, red meet and other fattening foods.  - get a least 150-300 minutes of aerobic exercise per week.   -We have ordered labs or studies at this visit. It can take up to 1-2 weeks for results and processing. We will contact you with instructions IF your results are abnormal. Normal results will be released to your [facility]. If you have not heard from [place] or can not find your results in [facility] in 2 weeks please contact our office.

## 2015-07-09 MED ORDER — ROSUVASTATIN CALCIUM 20 MG PO TABS: 20.0000 mg | ORAL_TABLET | Freq: Every day | ORAL | Status: DC

## 2015-07-09 NOTE — Addendum Note (Signed)
Addended by: Agnes Lawrence on: 07/09/2015 09:49 AM   Modules accepted: Orders

## 2015-08-24 ENCOUNTER — Ambulatory Visit (INDEPENDENT_AMBULATORY_CARE_PROVIDER_SITE_OTHER): Payer: PRIVATE HEALTH INSURANCE | Admitting: Family Medicine

## 2015-08-24 ENCOUNTER — Ambulatory Visit: Payer: Self-pay

## 2015-08-24 ENCOUNTER — Other Ambulatory Visit (INDEPENDENT_AMBULATORY_CARE_PROVIDER_SITE_OTHER): Payer: PRIVATE HEALTH INSURANCE

## 2015-08-24 ENCOUNTER — Encounter: Payer: Self-pay | Admitting: Family Medicine

## 2015-08-24 VITALS — BP 122/68 | HR 93 | Ht 62.0 in | Wt 157.0 lb

## 2015-08-24 DIAGNOSIS — M79672 Pain in left foot: Secondary | ICD-10-CM | POA: Diagnosis not present

## 2015-08-24 DIAGNOSIS — G5762 Lesion of plantar nerve, left lower limb: Secondary | ICD-10-CM | POA: Diagnosis not present

## 2015-08-24 DIAGNOSIS — R319 Hematuria, unspecified: Secondary | ICD-10-CM

## 2015-08-24 DIAGNOSIS — G5782 Other specified mononeuropathies of left lower limb: Secondary | ICD-10-CM

## 2015-08-24 LAB — URINALYSIS, ROUTINE W REFLEX MICROSCOPIC
Bilirubin Urine: NEGATIVE
Ketones, ur: NEGATIVE
NITRITE: NEGATIVE
Specific Gravity, Urine: 1.01 (ref 1.000–1.030)
Urine Glucose: NEGATIVE
Urobilinogen, UA: 0.2 (ref 0.0–1.0)
pH: 5.5 (ref 5.0–8.0)

## 2015-08-24 MED ORDER — SULFAMETHOXAZOLE-TRIMETHOPRIM 400-80 MG PO TABS: 1.0000 | ORAL_TABLET | Freq: Two times a day (BID) | ORAL | Status: DC

## 2015-08-24 NOTE — Assessment & Plan Note (Signed)
Patient states that she is having some hematuria with some mild dysuria. We will send for urinalysis. If any type of infection we'll treat accordingly.

## 2015-08-24 NOTE — Assessment & Plan Note (Signed)
Patient given injection and tolerated the procedure well. I has any recurrent stress fracture at the moment. Encourage her to wear the orthotic on a more regular basis. We discussed icing regimen. Given another trial topical anti-inflammatories. Follow-up again in 4-6 weeks for further evaluation and treatment.

## 2015-08-24 NOTE — Progress Notes (Signed)
Pre visit review using our clinic review tool, if applicable. No additional management support is needed unless otherwise documented below in the visit note. 

## 2015-08-24 NOTE — Progress Notes (Signed)
[name] [address], [place] [address] Phone: [phone] Subjective:      CC: Left foot pain follow-up   [id number] Alexis [name] is a 45 y.o. female coming in with complaint of left foot pain. Patient has not been seen for quite some time. Been 4 months. Patient was doing significantly better but stopped wearing the orthotic a regular basis. Patient has been doing more exercises on a regular basis as well in trying to walk more. States that the more she walks the more numbness she has in her toes. Rates the severity of pain a 6 out of 10. Seems to be worsening patient states that she has a throbbing sensation at night.  Past Medical History  Diagnosis Date  . Asthma     mild intermittent  . Genital warts   . Bronchitis     states gets once a year  . Anxiety   . Depression   . Hyperlipemia   . Acne   . Irritable bowel syndrome   . Allergy     SEASONAL  . Anemia     menorrhagia  . GERD (gastroesophageal reflux disease)   . Nephrolithiasis 123XX123    Renal colic of February 123456 -per prior PCP notes  . Gallstones     on [place]  . Abnormal Pap smear of cervix 04/06/2015    -s/p colpo 03/2015 with gyn with repeat pap with HPV advised in 1 year   . Glucose intolerance (impaired glucose tolerance)   . History of echocardiogram     a. Echo 2/17: Mild focal basal septal hypertrophy, EF 55-60%, normal wall motion, grade 1 diastolic dysfunction, trace MR, trace TR   Past Surgical History  Procedure Laterality Date  . Cesarean section    . Tubal ligation    . Laproscopic      2012 - bowel  . Endometrial ablation    . Gynecologic cryosurgery    . Cryosurgery cervix     Social History   Social History  . Marital Status: Divorced    Spouse Name: N/A  . Number of Children: 2  . Years of Education: N/A   Occupational History  . Emergency [place], [place] History Main Topics  . Smoking status: Never  Smoker   . Smokeless tobacco: Never Used  . Alcohol Use: No  . Drug Use: No  . Sexual Activity:    Partners: Male    Birth Control/ Protection: Surgical   Other Topics Concern  . 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 Hypertension Mother   . Hypertension Father   . Hyperlipidemia Father   . Cancer Maternal Aunt   . Heart attack Neg Hx   . Heart failure Neg Hx         Past medical history, social, surgical and family history all reviewed in electronic medical record.   Review of Systems: No headache, visual changes, nausea, vomiting, diarrhea, constipation, dizziness, abdominal pain, skin rash, fevers, chills, night sweats, weight loss, swollen lymph nodes, body aches, joint swelling, muscle aches, chest pain, shortness of breath, mood changes.   Objective Blood pressure 122/68, pulse 93, weight 157 lb (71.215 kg), SpO2 99 %.  General: No apparent distress alert and oriented x3 mood and affect normal, dressed appropriately.  HEENT: Pupils equal, extraocular movements intact  Respiratory: Patient's speak in full sentences and does not appear short of breath  Cardiovascular: No lower extremity edema, non tender, no erythema  Skin: Warm dry intact with no signs of infection or rash on extremities or on axial skeleton.  Abdomen: Soft nontender  Neuro: Cranial nerves II through XII are intact, neurovascularly intact in all extremities with 2+ DTRs and 2+ pulses.  Lymph: No lymphadenopathy of posterior or anterior cervical chain or axillae bilaterally.  Gait normal with good balance and coordination.  MSK:  Non tender with full range of motion and good stability and symmetric strength and tone of shoulders, elbows, wrist, hip, knee and ankles bilaterally.  Foot exam shows no gross deformity.  No swelling noted. No tenderness noted over the bone themselves but does have a positive squeeze test. Tender in the intermetatarsal space between the third and fourth and fourth and the toes. Neurovascular intact distally.  Procedure: Real-time Ultrasound Guided Injection of left foot neuroma Device: GE Logiq E  Ultrasound guided injection is preferred based studies that show increased duration, increased effect, greater accuracy, decreased procedural pain, increased response rate, and decreased cost with ultrasound guided versus blind injection.  Verbal informed consent obtained.  Time-out conducted.  Noted no overlying erythema, induration, or other signs of local infection.  Skin prepped in a sterile fashion.  Local anesthesia: Topical Ethyl chloride.  With sterile technique and under real time ultrasound guidance:  The 25-gauge half-inch needle patient was injected with a total of 0.5 mL of 0.5% Marcaine and 0.5 mL of 40 mg/dL Kenalog. This is done into the neuroma noted between the third intermetatarsal space. Tolerated procedure well. Completed without difficulty  Pain immediately resolved suggesting accurate placement of the medication.  Advised to call if fevers/chills, erythema, induration, drainage, or persistent bleeding.  Images permanently stored and available for review in the ultrasound unit.  Impression: Technically successful ultrasound guided injection.    Impression and Recommendations:     This case required medical decision making of moderate complexity.

## 2015-08-24 NOTE — Patient Instructions (Signed)
Good to see you Injected today  Try to wear the orthotic more regularly.  pennsaid pinkie amount topically 2 times daily as needed.  Ice still after a lot of activity  See me again i n4 weeks if not gone all the way.

## 2015-09-13 ENCOUNTER — Ambulatory Visit (INDEPENDENT_AMBULATORY_CARE_PROVIDER_SITE_OTHER): Payer: PRIVATE HEALTH INSURANCE | Admitting: Family Medicine

## 2015-09-13 ENCOUNTER — Encounter: Payer: Self-pay | Admitting: Family Medicine

## 2015-09-13 VITALS — BP 108/64 | HR 86 | Temp 98.3°F | Ht 62.0 in | Wt 158.6 lb

## 2015-09-13 DIAGNOSIS — M79602 Pain in left arm: Secondary | ICD-10-CM

## 2015-09-13 DIAGNOSIS — M25551 Pain in right hip: Secondary | ICD-10-CM | POA: Diagnosis not present

## 2015-09-13 DIAGNOSIS — G5602 Carpal tunnel syndrome, left upper limb: Secondary | ICD-10-CM

## 2015-09-13 NOTE — Progress Notes (Signed)
Pre visit review using our clinic review tool, if applicable. No additional management support is needed unless otherwise documented below in the visit note. 

## 2015-09-13 NOTE — Patient Instructions (Signed)
BEFORE YOU LEAVE: -greater trochanteric and rotator cuff exercises -follow up appointment in 1 month  FOR THE SHOULDER/ARM PAIN: -do the exercises 4 days per week -ibuprofen and topical over the counter sports creams as needed for the pain per instructions -heat for 15 minutes twice daily  FOR THE HAND PAIN: -use the brace provided every night  Do the hip exercises 4 days per week  Follow up sooner if woresning or new symptoms

## 2015-09-13 NOTE — Progress Notes (Signed)
HPI:  Acute visit for:  Arm pain: -started last week -focal point in L lateral shoulder -can't remember precipitating event -no weakness, numbness, pain  Paresthesia: -finger L, feels more in lateral side -does a lot of typing -no weakness, fevers, malaise -hx of ?tendonitis in hand in the past, had to wear a brace  Chronic R hip pain - mentions as leaving after lengthy appointment. Agrees to schedule follow up appt to evaluate. Will try exercises in the interim.  ROS: See pertinent positives and negatives per HPI.  Past Medical History  Diagnosis Date  . Asthma     mild intermittent  . Genital warts   . Bronchitis     states gets once a year  . Anxiety   . Depression   . Hyperlipemia   . Acne   . Irritable bowel syndrome   . Allergy     SEASONAL  . Anemia     menorrhagia  . GERD (gastroesophageal reflux disease)   . Nephrolithiasis 123XX123    Renal colic of February 123456 -per prior PCP notes  . Gallstones     on [place]  . Abnormal Pap smear of cervix 04/06/2015    -s/p colpo 03/2015 with gyn with repeat pap with HPV advised in 1 year   . Glucose intolerance (impaired glucose tolerance)   . History of echocardiogram     a. Echo 2/17: Mild focal basal septal hypertrophy, EF 55-60%, normal wall motion, grade 1 diastolic dysfunction, trace MR, trace TR    Past Surgical History  Procedure Laterality Date  . Cesarean section    . Tubal ligation    . Laproscopic      2012 - bowel  . Endometrial ablation    . Gynecologic cryosurgery    . Cryosurgery cervix      Family History  Problem Relation Age of Onset  . Hypertension Mother   . Hypertension Father   . Hyperlipidemia Father   . Cancer Maternal Aunt   . Heart attack Neg Hx   . Heart failure Neg Hx     Social History   Social History  . Marital Status: Divorced    Spouse Name: N/A  . Number of Children: 2  . Years of Education: N/A   Occupational History  . Emergency [place], [place] History Main Topics  . Smoking status: Never Smoker   . Smokeless tobacco: Never Used  . Alcohol Use: No  . Drug Use: No  . Sexual Activity:    Partners: Male    Birth Control/ Protection: Surgical   Other Topics Concern  . 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  Biotin 300 MCG TABS, Take 300 mcg by mouth daily., Disp: , Rfl:  .  cetirizine (ZYRTEC) 10 MG tablet, Take 10 mg by mouth daily., Disp: , Rfl:  .  Cholecalciferol (VITAMIN D3 PO), Take 4,000 Units by mouth daily., Disp: , Rfl:  .  fluticasone (FLONASE) 50 MCG/ACT nasal spray, use 2 sprays into each nostril once daily, Disp: 16 g, Rfl: 6 .  gabapentin (NEURONTIN) 100 MG capsule, Take 100 mg by mouth daily., Disp: , Rfl:  .  hydrochlorothiazide (MICROZIDE) 12.5 MG capsule, Take one cap by mouth every Monday and Thursday, Disp: 30 capsule,  Rfl: 3 .  Multiple Vitamin (MULTIVITAMIN WITH MINERALS) TABS, Take 1 tablet by mouth daily., Disp: , Rfl:  .  NON FORMULARY, Take 1 tablet by mouth daily. Togi (for hair growth), Disp: , Rfl:  .  Omega-3 Krill Oil 300 MG CAPS, Take 300 mg by mouth daily. , Disp: , Rfl:  .  potassium chloride (K-DUR) 10 MEQ tablet, Take 1 tablet (10 mEq total) by mouth daily., Disp: 30 tablet, Rfl: 3 .  propranolol (INDERAL) 10 MG tablet, Take 1 tablet (10 mg total) by mouth 4 (four) times daily as needed., Disp: 60 tablet, Rfl: 6 .  rosuvastatin (CRESTOR) 20 MG tablet, Take 1 tablet (20 mg total) by mouth daily., Disp: 90 tablet, Rfl: 1 .  sertraline (ZOLOFT) 100 MG tablet, Take 2 tablets (200 mg total) by mouth daily., Disp: 180 tablet, Rfl: 3 .  sulfamethoxazole-trimethoprim (BACTRIM) 400-80 MG tablet, Take 1 tablet by mouth 2 (two) times daily., Disp: 20 tablet, Rfl: 0  EXAM:  Filed Vitals:   09/13/15 0936  BP: 108/64  Pulse: 86  Temp:  98.3 F (36.8 C)    Body mass index is 29 kg/(m^2).  GENERAL: vitals reviewed and listed above, alert, oriented, appears well hydrated and in no acute distress  HEENT: atraumatic, conjunttiva clear, no obvious abnormalities on inspection of external nose and ears  NECK: no obvious masses on inspection  LUNGS: clear to auscultation bilaterally, no wheezes, rales or rhonchi, good air movement  CV: HRRR, no peripheral edema  MS: moves all extremities without noticeable abnormality, focal point of TTP even to light touch in RTC muscle L posterolat shoulder; ROM L shoulder limited only due to pain, normal strength, sensitivity to light touch and DTRs throughout in the bilateral upper extremities; + phalens and tinels; normal strength thoughtout in bilat hands and wrists; TTP greater trochanteric bursa R  PSYCH: pleasant and cooperative, no obvious depression or anxiety  ASSESSMENT AND PLAN:  Discussed the following assessment and plan:  Pain of left upper extremity  Carpal tunnel syndrome of left wrist  Hip pain, right  -we discussed possible serious and likely etiologies, workup and treatment, treatment risks and return precautions of her shoulder and hand complaints. I don't think they are connected. -suspect muscle strain as etiology shoulder pain and mild CTS  -HEP, heat, OTC analgesics advised for the pain -cock up wrist brace applied and fitted to be worn at night -after a lengthy visit and evaluation of her arms and hand complaints she mentions "what are you going to do about my hip?" as she is leaving - did very brief exam, provided exercises and advised close follow up to evaluate. -Patient advised to return or notify a doctor immediately if symptoms worsen or persist or new concerns arise.  Patient Instructions  BEFORE YOU LEAVE: -greater trochanteric and rotator cuff exercises -follow up appointment in 1 month  FOR THE SHOULDER/ARM PAIN: -do the exercises 4 days per  week -ibuprofen and topical over the counter sports creams as needed for the pain per instructions -heat for 15 minutes twice daily  FOR THE HAND PAIN: -use the brace provided every night  Do the hip exercises 4 days per week  Follow up sooner if woresning or new symptoms       KIM, HANNAH R.

## 2015-09-21 ENCOUNTER — Ambulatory Visit: Payer: PRIVATE HEALTH INSURANCE | Admitting: Family Medicine

## 2015-10-13 ENCOUNTER — Ambulatory Visit: Payer: PRIVATE HEALTH INSURANCE | Admitting: Family Medicine

## 2015-10-15 ENCOUNTER — Ambulatory Visit: Payer: PRIVATE HEALTH INSURANCE | Admitting: Family Medicine

## 2015-10-21 ENCOUNTER — Other Ambulatory Visit: Payer: Self-pay | Admitting: Family Medicine

## 2015-11-05 ENCOUNTER — Ambulatory Visit: Payer: PRIVATE HEALTH INSURANCE | Admitting: Family Medicine

## 2015-11-12 ENCOUNTER — Telehealth: Payer: Self-pay | Admitting: Cardiovascular Disease

## 2015-11-12 ENCOUNTER — Telehealth: Payer: Self-pay | Admitting: Family Medicine

## 2015-11-12 NOTE — Telephone Encounter (Signed)
Spoke with patient who states she took a semester off school due to cardiac problems and she needs a note stating she is cleared to return.  I advised that Dr. Acie Fredrickson will be back in the office Wednesday and that she may come in to get letter on Thursday.  She verbalized understanding and agreement and thanked me for the call.

## 2015-11-12 NOTE — Telephone Encounter (Signed)
Patient called back and I informed her of the message below.  She stated she needed this as she took some time off last semester due to palpitations and was seen by the cardiologist.  I advised that she contact the cardiology office to see what they recommend and this was sent to Dr Maudie Mercury for further recommendations.

## 2015-11-12 NOTE — Telephone Encounter (Signed)
Mrs. Alexis [name] is calling because she needs a statement saying that she is stable to go back to school . Please call   Thanks

## 2015-11-12 NOTE — Telephone Encounter (Signed)
The note to return should ideally come from the physician whom wrote her out of work last semester. I have not advised any work restriction in the past that I am aware of. Definitely would need to see her for me to lift any restriction and would need records from whom advised medical restriction last semester. Thanks.

## 2015-11-12 NOTE — Telephone Encounter (Signed)
Pt needs a note for school stating she is stable enough to return.. Pt took off last semester due to health issues. School start 8/15.  Pt has appt on Thurs, 8/17, but hope to get before then.  Pt would like note put in Mychart to access.

## 2015-11-12 NOTE — Telephone Encounter (Signed)
I left a message for the pt to return my call. 

## 2015-11-15 ENCOUNTER — Encounter: Payer: Self-pay | Admitting: Nurse Practitioner

## 2015-11-15 NOTE — Telephone Encounter (Signed)
Left message that letter has been placed up front for pick up.

## 2015-11-17 NOTE — Progress Notes (Signed)
HPI:  Follow up:  Hip Pain: -R, chronic -started Jan 2017 when wearing boot for another MS issues -sharp pain in the R buttock only with certain movements - particularly at night -does not bother her with sitting or walking -joined a group fitness activity and is doing lots of squats and pivots which worsens it -denies radiation, worsening, bowel or bladder issues, weakness or numbness in lower ext  Arm pain, L and L finger paresthesia: -dx likely muscle strain and CTS last visit - cock up brace applied and fitted and HEP provided -reports symptoms resolved   MDD: -on zoloft, started by prior PCP -sees Lavell Anchors for CBT -reports doing well -denies: SI, thoughts of self harm  IBS/gallstones: -sees GI, Dr. Ardis Hughs -meds: linzess  Seasonal allergies/MIA: -meds zyrtec and nasonex -denies: fevers, sinus pain, tooth pain, SOB, NVD  HLD: -advised statin and healthy lifestyle last check 07/2015 - she started medication -Has improved her diet some, eating more veggies and less red meet; exercising  Palpitations/HTN: -s/p evaluation with cardiology -meds: hctz 12.5, propranolol 10 -Reports is doing better  ROS: See pertinent positives and negatives per HPI.  Past Medical History:  Diagnosis Date  . Abnormal Pap smear of cervix 04/06/2015   -s/p colpo 03/2015 with gyn with repeat pap with HPV advised in 1 year   . Acne   . Allergy    SEASONAL  . Anemia    menorrhagia  . Anxiety   . Asthma    mild intermittent  . Bronchitis    states gets once a year  . Depression   . Gallstones    on [place]  . Genital warts   . GERD (gastroesophageal reflux disease)   . Glucose intolerance (impaired glucose tolerance)   . History of echocardiogram    a. Echo 2/17: Mild focal basal septal hypertrophy, EF 55-60%, normal wall motion, grade 1 diastolic dysfunction, trace MR, trace TR  . Hyperlipemia   . Irritable bowel syndrome   . Nephrolithiasis 123XX123   Renal colic of  February 123456 -per prior PCP notes    Past Surgical History:  Procedure Laterality Date  . CESAREAN SECTION    . cryosurgery cervix    . ENDOMETRIAL ABLATION    . GYNECOLOGIC CRYOSURGERY    . LAPROSCOPIC     2012 - bowel  . TUBAL LIGATION      Family History  Problem Relation Age of Onset  . Hypertension Mother   . Hypertension Father   . Hyperlipidemia Father   . Cancer Maternal Aunt   . Heart attack Neg Hx   . Heart failure Neg Hx     Social History   Social History  . Marital status: Divorced    Spouse name: N/A  . Number of children: 2  . Years of education: N/A   Occupational History  . Emergency [place], [place] History Main Topics  . Smoking status: Never Smoker  . Smokeless tobacco: Never Used  . Alcohol use No  . Drug use: No  . Sexual activity: Yes    Partners: Male    Birth control/ protection: Surgical   Other Topics Concern  . 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  Biotin 300 MCG TABS, Take 300 mcg by  mouth daily., Disp: , Rfl:  .  cetirizine (ZYRTEC) 10 MG tablet, Take 10 mg by mouth daily., Disp: , Rfl:  .  Cholecalciferol (VITAMIN D3 PO), Take 4,000 Units by mouth daily., Disp: , Rfl:  .  fluticasone (FLONASE) 50 MCG/ACT nasal spray, use 2 sprays into each nostril once daily, Disp: 16 g, Rfl: 6 .  gabapentin (NEURONTIN) 100 MG capsule, Take 100 mg by mouth daily., Disp: , Rfl:  .  hydrochlorothiazide (MICROZIDE) 12.5 MG capsule, Take one cap by mouth every Monday and Thursday, Disp: 30 capsule, Rfl: 3 .  Multiple Vitamin (MULTIVITAMIN WITH MINERALS) TABS, Take 1 tablet by mouth daily., Disp: , Rfl:  .  NON FORMULARY, Take 1 tablet by mouth daily. Togi (for hair growth), Disp: , Rfl:  .  Omega-3 Krill Oil 300 MG CAPS, Take 300 mg by mouth daily. , Disp: , Rfl:  .   potassium chloride (K-DUR) 10 MEQ tablet, Take 1 tablet (10 mEq total) by mouth daily., Disp: 30 tablet, Rfl: 3 .  propranolol (INDERAL) 10 MG tablet, Take 1 tablet (10 mg total) by mouth 4 (four) times daily as needed., Disp: 60 tablet, Rfl: 6 .  rosuvastatin (CRESTOR) 20 MG tablet, Take 1 tablet (20 mg total) by mouth daily., Disp: 90 tablet, Rfl: 1 .  sertraline (ZOLOFT) 100 MG tablet, TAKE 2 TABLETS BY MOUTH DAILY, Disp: 180 tablet, Rfl: 0 .  sulfamethoxazole-trimethoprim (BACTRIM) 400-80 MG tablet, Take 1 tablet by mouth 2 (two) times daily., Disp: 20 tablet, Rfl: 0  EXAM:  Vitals:   11/18/15 0946  BP: 100/76  Pulse: 82  Temp: 98.8 F (37.1 C)    Body mass index is 29.12 kg/m.  GENERAL: vitals reviewed and listed above, alert, oriented, appears well hydrated and in no acute distress  HEENT: atraumatic, conjunttiva clear, no obvious abnormalities on inspection of external nose and ears  NECK: no obvious masses on inspection  LUNGS: clear to auscultation bilaterally, no wheezes, rales or rhonchi, good air movement  CV: HRRR, no peripheral edema  MS: moves all extremities without noticeable abnormality Normal Gait Normal inspection of back, no obvious scoliosis or leg length descrepancy No bony TTP Soft tissue TTP at: area of gluteus medius bursa and opturator/piri tendons, pain with resistent ext rot hip -/+ tests: neg trendelenburg,-facet loading, -SLRT, -CLRT, -FABER, -FADIR Normal muscle strength, sensation to light touch and DTRs in LEs bilaterally  PSYCH: pleasant and cooperative, no obvious depression or anxiety  ASSESSMENT AND PLAN:  Discussed the following assessment and plan:  Right buttock pain -suspect bursitis or tendon pathology -ne exercise program provided, also advised activity modification and supportive measures -advised eval with sports med if persists  Hyperlipemia - Plan: Lipid Panel -lifestyle recs  Carpal tunnel syndrome, unspecified  laterality -so thankful this is resolved  BMI 29.0-29.9,adult -lifestyle, strongly encourage to cont exercise with some modification due to buttock pain  Essential hypertension - Plan: Basic metabolic panel, CBC (no diff)  -Patient advised to return or notify a doctor immediately if symptoms worsen or persist or new concerns arise.  Patient Instructions  BEFORE YOU LEAVE: -follow up: 4 months -labs -exercises  Please stop the exercises from last visit and start the new ones. Please continue to exercise - but avoid strenuous squat and lunging activity. Schedule an appointment with Dr. Tamala Julian to evaluate the buttock pain if persists.  We have ordered labs or studies at this visit. It can take up to 1-2 weeks for results and processing. IF  results require follow up or explanation, we will call you with instructions. Clinically stable results will be released to your [facility]. If you have not heard from [place] or cannot find your results in [facility] in 2 weeks please contact our office at [phone].  If you are not yet signed up for [facility], please consider signing up.  We recommend the following healthy lifestyle: 1) Small portions - eat off of salad plate instead of dinner plate 2) Eat a healthy clean diet with avoidance of (less then 1 serving per week) processed foods, sweetened drinks, white starches, red meat, fast foods and sweets and consisting of: * 5-9 servings per day of fresh or frozen fruits and vegetables (not corn or potatoes, not dried or canned) *nuts and seeds, beans *olives and olive oil *small portions of lean meats such as fish and white chicken  *small portions of whole grains 3)Get at least 150 minutes of sweaty aerobic exercise per week 4)reduce stress - counseling, meditation, relaxation to balance other aspects of your life       Colin Benton R., DO

## 2015-11-18 ENCOUNTER — Ambulatory Visit (INDEPENDENT_AMBULATORY_CARE_PROVIDER_SITE_OTHER): Payer: PRIVATE HEALTH INSURANCE | Admitting: Family Medicine

## 2015-11-18 ENCOUNTER — Encounter: Payer: Self-pay | Admitting: Family Medicine

## 2015-11-18 VITALS — BP 100/76 | HR 82 | Temp 98.8°F | Ht 62.0 in | Wt 159.2 lb

## 2015-11-18 DIAGNOSIS — G5600 Carpal tunnel syndrome, unspecified upper limb: Secondary | ICD-10-CM | POA: Diagnosis not present

## 2015-11-18 DIAGNOSIS — E785 Hyperlipidemia, unspecified: Secondary | ICD-10-CM | POA: Diagnosis not present

## 2015-11-18 DIAGNOSIS — M791 Myalgia: Secondary | ICD-10-CM

## 2015-11-18 DIAGNOSIS — Z6829 Body mass index (BMI) 29.0-29.9, adult: Secondary | ICD-10-CM

## 2015-11-18 DIAGNOSIS — M7918 Myalgia, other site: Secondary | ICD-10-CM

## 2015-11-18 DIAGNOSIS — I10 Essential (primary) hypertension: Secondary | ICD-10-CM

## 2015-11-18 LAB — CBC
HEMATOCRIT: 36.9 % (ref 36.0–46.0)
Hemoglobin: 12.2 g/dL (ref 12.0–15.0)
MCHC: 33.1 g/dL (ref 30.0–36.0)
MCV: 80.1 fl (ref 78.0–100.0)
Platelets: 313 10*3/uL (ref 150.0–400.0)
RBC: 4.61 Mil/uL (ref 3.87–5.11)
RDW: 16 % — AB (ref 11.5–15.5)
WBC: 6.6 10*3/uL (ref 4.0–10.5)

## 2015-11-18 LAB — LIPID PANEL
CHOL/HDL RATIO: 4
Cholesterol: 157 mg/dL (ref 0–200)
HDL: 41.7 mg/dL (ref >39.00)
LDL Cholesterol: 95 mg/dL (ref 0–99)
NONHDL: 115.53
Triglycerides: 102 mg/dL (ref 0.0–149.0)
VLDL: 20.4 mg/dL (ref 0.0–40.0)

## 2015-11-18 LAB — BASIC METABOLIC PANEL
BUN: 16 mg/dL (ref 6–23)
CHLORIDE: 102 meq/L (ref 96–112)
CO2: 30 mEq/L (ref 19–32)
CREATININE: 0.87 mg/dL (ref 0.40–1.20)
Calcium: 9.8 mg/dL (ref 8.4–10.5)
GFR: 90.54 mL/min (ref >60.00)
Glucose, Bld: 95 mg/dL (ref 70–99)
Potassium: 4.8 mEq/L (ref 3.5–5.1)
SODIUM: 138 meq/L (ref 135–145)

## 2015-11-18 NOTE — Progress Notes (Signed)
Pre visit review using our clinic review tool, if applicable. No additional management support is needed unless otherwise documented below in the visit note. 

## 2015-11-18 NOTE — Patient Instructions (Signed)
BEFORE YOU LEAVE: -follow up: 4 months -labs -exercises  Please stop the exercises from last visit and start the new ones. Please continue to exercise - but avoid strenuous squat and lunging activity. Schedule an appointment with Dr. Tamala Julian to evaluate the buttock pain if persists.  We have ordered labs or studies at this visit. It can take up to 1-2 weeks for results and processing. IF results require follow up or explanation, we will call you with instructions. Clinically stable results will be released to your [facility]. If you have not heard from [place] or cannot find your results in [facility] in 2 weeks please contact our office at [phone].  If you are not yet signed up for [facility], please consider signing up.  We recommend the following healthy lifestyle: 1) Small portions - eat off of salad plate instead of dinner plate 2) Eat a healthy clean diet with avoidance of (less then 1 serving per week) processed foods, sweetened drinks, white starches, red meat, fast foods and sweets and consisting of: * 5-9 servings per day of fresh or frozen fruits and vegetables (not corn or potatoes, not dried or canned) *nuts and seeds, beans *olives and olive oil *small portions of lean meats such as fish and white chicken  *small portions of whole grains 3)Get at least 150 minutes of sweaty aerobic exercise per week 4)reduce stress - counseling, meditation, relaxation to balance other aspects of your life

## 2016-01-19 ENCOUNTER — Other Ambulatory Visit: Payer: Self-pay | Admitting: Family Medicine

## 2016-02-04 ENCOUNTER — Other Ambulatory Visit: Payer: Self-pay | Admitting: Family Medicine

## 2016-05-03 ENCOUNTER — Telehealth: Payer: Self-pay | Admitting: *Deleted

## 2016-05-03 ENCOUNTER — Other Ambulatory Visit: Payer: Self-pay | Admitting: Family Medicine

## 2016-05-03 NOTE — Telephone Encounter (Signed)
Patient in 08 recall for 04/2016- left message to call and schedule- AEX/PAP

## 2016-05-08 ENCOUNTER — Ambulatory Visit: Payer: PRIVATE HEALTH INSURANCE

## 2016-05-08 ENCOUNTER — Ambulatory Visit: Payer: PRIVATE HEALTH INSURANCE | Admitting: Family Medicine

## 2016-05-08 NOTE — Progress Notes (Signed)
HPI:  Alexis [name] is a pleasant 46 yo here for follow up. PMH MDD, IBS, seasonal allergies, mild int asthma, hyperlipidemia, palpitations and hypertension. Reports she has been having daily headaches for about 1 month. 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 Gets pain and tension in back of neck and then this radiates to R head. Posture is poor. Also feels more tired then usual - lack of motivation. Denies fevers, wt loss, depression, increased anxiety, thoughts of self harm, malaise, chills, illness, vision changes, weakness, numbness, speech or hearing changes. Still exercising several days per week. Due for tetanus booster, labs.  ROS: See pertinent positives and negatives per HPI.  Past Medical History:  Diagnosis Date  . Abnormal Pap smear of cervix 04/06/2015   -s/p colpo 03/2015 with gyn with repeat pap with HPV advised in 1 year   . Acne   . Allergy    SEASONAL  . Anemia    menorrhagia  . Anxiety   . Asthma    mild intermittent  . Bronchitis    states gets once a year  . Depression   . Gallstones    on [place]  . Genital warts   . GERD (gastroesophageal reflux disease)   . Glucose intolerance (impaired glucose tolerance)   . History of echocardiogram    a. Echo 2/17: Mild focal basal septal hypertrophy, EF 55-60%, normal wall motion, grade 1 diastolic dysfunction, trace MR, trace TR  . Hyperlipemia   . Irritable bowel syndrome   . Nephrolithiasis 123XX123   Renal colic of February 123456 -per prior PCP notes    Past Surgical History:  Procedure Laterality Date  . CESAREAN SECTION    . cryosurgery cervix    . ENDOMETRIAL ABLATION    . GYNECOLOGIC CRYOSURGERY    . LAPROSCOPIC     2012 - bowel  . TUBAL LIGATION      Family History  Problem Relation Age of Onset  . Hypertension Mother   . Hypertension Father   . Hyperlipidemia Father   . Cancer Maternal Aunt   . Heart attack Neg Hx   . Heart failure Neg Hx     Social History   Social  History  . Marital status: Divorced    Spouse name: N/A  . Number of children: 2  . Years of education: N/A   Occupational History  . Emergency [place], [place] History Main Topics  . Smoking status: Never Smoker  . Smokeless tobacco: Never Used  . Alcohol use No  . Drug use: No  . Sexual activity: Yes    Partners: Male    Birth control/ protection: Surgical   Other Topics Concern  . 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  Biotin 300 MCG TABS, Take 5,000 mcg by mouth daily. , Disp: , Rfl:  .  cetirizine (ZYRTEC) 10 MG tablet, Take 10 mg by mouth daily., Disp: , Rfl:  .  Cholecalciferol (VITAMIN D3 PO), Take 4,000 Units by mouth daily., Disp: , Rfl:  .  Cholecalciferol (VITAMIN D3 PO), Take 5,000 Units by mouth., Disp: , Rfl:  .  fluticasone (FLONASE) 50 MCG/ACT nasal spray, use 2 sprays into each nostril once daily, Disp: 16 g, Rfl: 6 .  gabapentin (  NEURONTIN) 100 MG capsule, Take 100 mg by mouth daily., Disp: , Rfl:  .  hydrochlorothiazide (MICROZIDE) 12.5 MG capsule, Take one cap by mouth every Monday and Thursday, Disp: 30 capsule, Rfl: 3 .  Multiple Vitamin (MULTIVITAMIN WITH MINERALS) TABS, Take 1 tablet by mouth daily., Disp: , Rfl:  .  Omega-3 Krill Oil 300 MG CAPS, Take 300 mg by mouth daily. , Disp: , Rfl:  .  potassium chloride (K-DUR) 10 MEQ tablet, Take 1 tablet (10 mEq total) by mouth daily., Disp: 30 tablet, Rfl: 3 .  propranolol (INDERAL) 10 MG tablet, Take 1 tablet (10 mg total) by mouth 4 (four) times daily as needed., Disp: 60 tablet, Rfl: 6 .  rosuvastatin (CRESTOR) 20 MG tablet, TAKE ONE TABLET BY MOUTH ONCE DAILY, Disp: 30 tablet, Rfl: 6 .  sertraline (ZOLOFT) 100 MG tablet, take 2 tablets by mouth once daily, Disp: 180 tablet, Rfl: 0 .  cyclobenzaprine (FLEXERIL) 5 MG tablet, Take 1  tablet (5 mg total) by mouth at bedtime as needed for muscle spasms., Disp: 30 tablet, Rfl: 1  EXAM:  Vitals:   05/09/16 0745  BP: 100/80  Pulse: 83  Temp: 98.2 F (36.8 C)    Body mass index is 30.4 kg/m.  GENERAL: vitals reviewed and listed above, alert, oriented, appears well hydrated and in no acute distress  HEENT: atraumatic, conjunttiva clear, no obvious abnormalities on inspection of external nose and ears  NECK: no obvious masses on inspection  LUNGS: clear to auscultation bilaterally, no wheezes, rales or rhonchi, good air movement  CV: HRRR, no peripheral edema  MS: moves all extremities without noticeable abnormality, tension in bilat traps and cervical paraspinal muscles and sub occ muscles with TTP in R sub occ muscles.  PSYCH: pleasant and cooperative, flat affect, CN II-XII grossly intact, finger to nose normal, speech and thought processing grossly intact.  ASSESSMENT AND PLAN:  Discussed the following assessment and plan:  Frequent headaches - Plan: CBC (no diff), Basic Metabolic Panel, Vitamin 123456, TSH  Poor posture  Recurrent major depressive disorder, in full remission ([place])  Anxiety  Pure hypercholesterolemia  Need for prophylactic vaccination with tetanus-diphtheria (TD) - Plan: Td vaccine greater than or equal to 7yo preservative free IM  -labs -discussed potential etiology fatigue and headaches - change in work, stress, posture likely contributing  - advised labs and MRI to R/o other -she agreed to labs, declined MRI for now but agrees to do if headaches persist -muscle relaxer, home treatments, postural exercise for postural strain and headaches -continue SSRI, consider CBT, fun activities and getting out -close follow up -Patient advised to return or notify a doctor immediately if symptoms worsen or persist or new concerns arise.  Patient Instructions  BEFORE YOU LEAVE: -tetanus booster -TB skin test -labs -follow up: 1 month  Try  to get outside if possible.  Do the postural exercises and watch posture at work.   For headaches - try topical menthol (tiger balm) and flexeril at night. Do not use any over the counter medications such as aleve, tylenol, ibuprofen, etc more then two days per week or you can get daily rebound headaches.  Call in 2 weeks to let me know if headaches are improving.  We have ordered labs or studies at this visit. It can take up to 1-2 weeks for results and processing. IF results require follow up or explanation, we will call you with instructions. Clinically stable results will be released to your [facility]. If you  have not heard from [place] or cannot find your results in [facility] in 2 weeks please contact our office at [phone].   If you are not yet signed up for [facility], please SIGN UP TODAY. We now offer online scheduling, same day appointments and extended hours. WHEN YOU DON'T FEEL YOUR BEST.Marland KitchenMarland KitchenWE ARE HERE TO HELP.            Colin Benton R., DO

## 2016-05-08 NOTE — Telephone Encounter (Signed)
Patient called back- scheduled for 05/11/16 -eh

## 2016-05-09 ENCOUNTER — Ambulatory Visit (INDEPENDENT_AMBULATORY_CARE_PROVIDER_SITE_OTHER): Payer: PRIVATE HEALTH INSURANCE | Admitting: Family Medicine

## 2016-05-09 ENCOUNTER — Encounter: Payer: Self-pay | Admitting: Family Medicine

## 2016-05-09 VITALS — BP 100/80 | HR 83 | Temp 98.2°F | Ht 62.0 in | Wt 166.2 lb

## 2016-05-09 DIAGNOSIS — F419 Anxiety disorder, unspecified: Secondary | ICD-10-CM

## 2016-05-09 DIAGNOSIS — R293 Abnormal posture: Secondary | ICD-10-CM

## 2016-05-09 DIAGNOSIS — R51 Headache: Secondary | ICD-10-CM

## 2016-05-09 DIAGNOSIS — F3342 Major depressive disorder, recurrent, in full remission: Secondary | ICD-10-CM | POA: Diagnosis not present

## 2016-05-09 DIAGNOSIS — Z23 Encounter for immunization: Secondary | ICD-10-CM | POA: Diagnosis not present

## 2016-05-09 DIAGNOSIS — R519 Headache, unspecified: Secondary | ICD-10-CM

## 2016-05-09 DIAGNOSIS — E7800 Pure hypercholesterolemia, unspecified: Secondary | ICD-10-CM

## 2016-05-09 LAB — VITAMIN B12: Vitamin B-12: 167 pg/mL — ABNORMAL LOW (ref 211–911)

## 2016-05-09 LAB — BASIC METABOLIC PANEL
BUN: 18 mg/dL (ref 6–23)
CALCIUM: 9.1 mg/dL (ref 8.4–10.5)
CO2: 31 mEq/L (ref 19–32)
Chloride: 106 mEq/L (ref 96–112)
Creatinine, Ser: 0.71 mg/dL (ref 0.40–1.20)
GFR: 114.22 mL/min (ref >60.00)
GLUCOSE: 107 mg/dL — AB (ref 70–99)
Potassium: 4.3 mEq/L (ref 3.5–5.1)
Sodium: 141 mEq/L (ref 135–145)

## 2016-05-09 LAB — CBC
HEMATOCRIT: 35.5 % — AB (ref 36.0–46.0)
Hemoglobin: 11.6 g/dL — ABNORMAL LOW (ref 12.0–15.0)
MCHC: 32.7 g/dL (ref 30.0–36.0)
MCV: 80.2 fl (ref 78.0–100.0)
PLATELETS: 270 10*3/uL (ref 150.0–400.0)
RBC: 4.43 Mil/uL (ref 3.87–5.11)
RDW: 15.6 % — ABNORMAL HIGH (ref 11.5–15.5)
WBC: 5.3 10*3/uL (ref 4.0–10.5)

## 2016-05-09 LAB — TSH: TSH: 2.58 u[IU]/mL (ref 0.35–4.50)

## 2016-05-09 MED ORDER — CYCLOBENZAPRINE HCL 5 MG PO TABS: 5.0000 mg | ORAL_TABLET | Freq: Every evening | ORAL | 1 refills | Status: DC | PRN

## 2016-05-09 NOTE — Progress Notes (Signed)
Pre visit review using our clinic review tool, if applicable. No additional management support is needed unless otherwise documented below in the visit note. 

## 2016-05-09 NOTE — Patient Instructions (Signed)
BEFORE YOU LEAVE: -tetanus booster -TB skin test -labs -follow up: 1 month  Try to get outside if possible.  Do the postural exercises and watch posture at work.   For headaches - try topical menthol (tiger balm) and flexeril at night. Do not use any over the counter medications such as aleve, tylenol, ibuprofen, etc more then two days per week or you can get daily rebound headaches.  Call in 2 weeks to let me know if headaches are improving.  We have ordered labs or studies at this visit. It can take up to 1-2 weeks for results and processing. IF results require follow up or explanation, we will call you with instructions. Clinically stable results will be released to your [facility]. If you have not heard from [place] or cannot find your results in [facility] in 2 weeks please contact our office at [phone].   If you are not yet signed up for [facility], please SIGN UP TODAY. We now offer online scheduling, same day appointments and extended hours. WHEN YOU DON'T FEEL YOUR BEST.Marland KitchenMarland KitchenWE ARE HERE TO HELP.

## 2016-05-11 ENCOUNTER — Encounter: Payer: Self-pay | Admitting: Obstetrics and Gynecology

## 2016-05-11 ENCOUNTER — Telehealth: Payer: Self-pay | Admitting: *Deleted

## 2016-05-11 ENCOUNTER — Ambulatory Visit (INDEPENDENT_AMBULATORY_CARE_PROVIDER_SITE_OTHER): Payer: PRIVATE HEALTH INSURANCE | Admitting: Obstetrics and Gynecology

## 2016-05-11 VITALS — BP 102/70 | HR 76 | Resp 15 | Ht 62.0 in | Wt 161.0 lb

## 2016-05-11 DIAGNOSIS — Z0000 Encounter for general adult medical examination without abnormal findings: Secondary | ICD-10-CM | POA: Diagnosis not present

## 2016-05-11 DIAGNOSIS — Z124 Encounter for screening for malignant neoplasm of cervix: Secondary | ICD-10-CM | POA: Diagnosis not present

## 2016-05-11 DIAGNOSIS — Z01419 Encounter for gynecological examination (general) (routine) without abnormal findings: Secondary | ICD-10-CM | POA: Diagnosis not present

## 2016-05-11 DIAGNOSIS — Z131 Encounter for screening for diabetes mellitus: Secondary | ICD-10-CM

## 2016-05-11 DIAGNOSIS — Z1211 Encounter for screening for malignant neoplasm of colon: Secondary | ICD-10-CM

## 2016-05-11 DIAGNOSIS — Z862 Personal history of diseases of the blood and blood-forming organs and certain disorders involving the immune mechanism: Secondary | ICD-10-CM

## 2016-05-11 DIAGNOSIS — Z8632 Personal history of gestational diabetes: Secondary | ICD-10-CM

## 2016-05-11 DIAGNOSIS — Z8619 Personal history of other infectious and parasitic diseases: Secondary | ICD-10-CM

## 2016-05-11 LAB — POC HEMOCCULT BLD/STL (OFFICE/1-CARD/DIAGNOSTIC): FECAL OCCULT BLD: POSITIVE — AB

## 2016-05-11 NOTE — Telephone Encounter (Signed)
I called the pt to schedule her for a TB skin test as when she was seen on Tuesday we did not have any of the vaccine.  Patient stated she has scheduled an appt to have this done through her job.

## 2016-05-11 NOTE — Patient Instructions (Signed)

## 2016-05-11 NOTE — Progress Notes (Addendum)
46 y.o. [id number] DivorcedAfrican AmericanF here for annual exam.  She c/o recurrent yeast infections, uses boric acid every month for up to 5 days. She was on diflucan suppression. Her symptoms start with being uncomfortable and then bad itching. She has a increased cottage cheese d/c, yellow. Recent fasting glucose was 107.  Period Cycle (Days): 28 Period Duration (Days): 3-4 days  Period Pattern: Regular Menstrual Flow: Moderate Menstrual Control: Maxi pad Menstrual Control Change Freq (Hours): changes pad three times per day Dysmenorrhea: (!) Moderate Dysmenorrhea Symptoms: Cramping  Cramps are tolerable with ibuprofen.  She has been having headaches daily for the last 2 weeks. She has spoken with her primary. If they persist she will have a MRI. Constant headache on the whole right side of her head.  She was recently diagnosed with mild anemia. Not bleeding excessively.    Patient's last menstrual period was 05/04/2016.          Sexually active: No.  The current method of family planning is tubal ligation.    Exercising: Yes.    weights/ sit ups/ push ups  Smoker:  no  Health Maintenance: Pap: 03-09-15 WNL +HR HPV (colpo unsatisfactory, minimal benign tissue on ECC)/ 03-01-11 WNL History of abnormal Pap:  Yes + HR HPV MMG:  02-24-14 left breast U/S WNL  Colonoscopy:  05-12-14 WNl BMD:   Never TDaP:  05-09-16 Gardasil: N/A   reports that she has never smoked. She has never used smokeless tobacco. She reports that she does not drink alcohol or use drugs. 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 Older son is in college, younger son is a Equities trader in [employer], will go to college in the fall.   Past Medical History:  Diagnosis Date  . Abnormal Pap smear of cervix 04/06/2015   -s/p colpo 03/2015 with gyn with repeat pap with HPV advised in 1 year   . Acne   . Allergy    SEASONAL  . Anemia    menorrhagia  . Anxiety   . Asthma    mild intermittent  . Bronchitis    states gets once a year   . Depression   . Gallstones    on [place]  . Genital warts   . GERD (gastroesophageal reflux disease)   . Glucose intolerance (impaired glucose tolerance)   . History of echocardiogram    a. Echo 2/17: Mild focal basal septal hypertrophy, EF 55-60%, normal wall motion, grade 1 diastolic dysfunction, trace MR, trace TR  . History of gestational diabetes   . Hyperlipemia   . Irritable bowel syndrome   . Nephrolithiasis [id number]   Renal colic of February 123456 -per prior PCP notes  . Neuroma    in her left foot, has numbness in her toes (on gabapentin)    Past Surgical History:  Procedure Laterality Date  . CESAREAN SECTION    . cryosurgery cervix    . ENDOMETRIAL ABLATION    . GYNECOLOGIC CRYOSURGERY    . LAPROSCOPIC     2012 - bowel  . TUBAL LIGATION      Current Outpatient Prescriptions  Medication Sig Dispense Refill  . Biotin 300 MCG TABS Take 5,000 mcg by mouth daily.     . cetirizine (ZYRTEC) 10 MG tablet Take 10 mg by mouth daily.    . Cholecalciferol (VITAMIN D3 PO) Take 5,000 Units by mouth.    . cyclobenzaprine (FLEXERIL) 5 MG tablet Take 1 tablet (5 mg total) by mouth at bedtime as  needed for muscle spasms. 30 tablet 1  . fluticasone (FLONASE) 50 MCG/ACT nasal spray use 2 sprays into each nostril once daily 16 g 6  . gabapentin (NEURONTIN) 100 MG capsule Take 100 mg by mouth daily.    . hydrochlorothiazide (MICROZIDE) 12.5 MG capsule Take one cap by mouth every Monday and Thursday 30 capsule 3  . potassium chloride (K-DUR) 10 MEQ tablet Take 1 tablet (10 mEq total) by mouth daily. 30 tablet 3  . propranolol (INDERAL) 10 MG tablet Take 1 tablet (10 mg total) by mouth 4 (four) times daily as needed. 60 tablet 6  . rosuvastatin (CRESTOR) 20 MG tablet TAKE ONE TABLET BY MOUTH ONCE DAILY 30 tablet 6  . sertraline (ZOLOFT) 100 MG tablet take 2 tablets by mouth once daily 180 tablet 0  . Multiple Vitamin (MULTIVITAMIN WITH MINERALS) TABS Take 1 tablet by mouth daily.     No  current facility-administered medications for this visit.     Family History  Problem Relation Age of Onset  . Hypertension Mother   . Hypertension Father   . Hyperlipidemia Father   . Cancer Maternal Aunt   . Heart attack Neg Hx   . Heart failure Neg Hx     Review of Systems  Constitutional: Negative.   HENT: Negative.   Eyes: Negative.   Respiratory: Negative.   Cardiovascular: Negative.   Gastrointestinal: Negative.   Endocrine: Negative.   Genitourinary: Negative.   Musculoskeletal: Negative.   Skin: Negative.   Allergic/Immunologic: Negative.   Neurological: Positive for headaches.  Psychiatric/Behavioral: Negative.     Exam:   BP 102/70 (BP Location: Right Arm, Patient Position: Sitting, Cuff Size: Normal)   Pulse 76   Resp 15   Ht 5\' 2"  (1.575 m)   Wt 161 lb (73 kg)   LMP 05/04/2016   BMI 29.45 kg/m   Weight change: @WEIGHTCHANGE @ Height:   Height: 5\' 2"  (157.5 cm)  Ht Readings from Last 3 Encounters:  05/11/16 5\' 2"  (1.575 m)  05/09/16 5\' 2"  (1.575 m)  11/18/15 5\' 2"  (1.575 m)    General appearance: alert, cooperative and appears stated age Head: Normocephalic, without obvious abnormality, atraumatic Neck: no adenopathy, supple, symmetrical, trachea midline and thyroid normal to inspection and palpation Lungs: clear to auscultation bilaterally Cardiovascular: regular rate and rhythm Breasts: normal appearance, no masses or tenderness Heart: regular rate and rhythm Abdomen: soft, non-tender; bowel sounds normal; no masses,  no organomegaly Extremities: extremities normal, atraumatic, no cyanosis or edema Skin: Skin color, texture, turgor normal. No rashes or lesions Lymph nodes: Cervical, supraclavicular, and axillary nodes normal. No abnormal inguinal nodes palpated Neurologic: Grossly normal   Pelvic: External genitalia:  no lesions              Urethra:  normal appearing urethra with no masses, tenderness or lesions              Bartholins and  Skenes: normal                 Vagina: normal appearing vagina with normal color and discharge, no lesions              Cervix: no lesions               Bimanual Exam:  Uterus:  normal size, contour, position, consistency, mobility, non-tender and anteverted              Adnexa: no mass, fullness, tenderness  Rectovaginal: Confirms               Anus:  normal sphincter tone, no lesions  Stool guaiac +  Chaperone was present for exam.  A:  Well Woman with normal exam  H/O HPV+  H/O recurrent yeast infection and an elevated fasting glucose  Mild anemia, not bleeding heavily with her cycles  Stool guaiac +  P:   Pap with hpv  HgbA1C  Vit D  Return with recurrent yeast infection for confirmation (normal exam today)  Mammogram encouraged  Discussed breast self exam  Discussed calcium and vit D intake  IFOB, given. Will inform her primary MD   CC: Colin Benton   Addendum: pap returned as negative with negative hpv. It was satisfactory, but no endocervical cells were seen. Will plan to recheck a pap and hpv in 1 year.

## 2016-05-12 LAB — HEMOGLOBIN A1C
Hgb A1c MFr Bld: 5.9 % — ABNORMAL HIGH (ref <5.7)
Mean Plasma Glucose: 123 mg/dL

## 2016-05-12 LAB — FECAL OCCULT BLOOD, IMMUNOCHEMICAL: IFOBT: NEGATIVE

## 2016-05-12 LAB — VITAMIN D 25 HYDROXY (VIT D DEFICIENCY, FRACTURES): VIT D 25 HYDROXY: 73 ng/mL (ref 30–100)

## 2016-05-12 NOTE — Addendum Note (Signed)
Addended by: Polly Cobia on: 05/12/2016 10:35 AM   Modules accepted: Orders

## 2016-05-16 LAB — IPS PAP TEST WITH HPV

## 2016-06-06 NOTE — Progress Notes (Signed)
HPI:  Follow up:  Had a number of issues last appointment. Had started new work and was doing a lot more desk work, had increased stress, increased frequency headaches and neck pain. Has hx MDD. Did labs and advised MRI for the headaches. She refused MRI at that time.  Advised postural exercises, muscle relaxer, stress management and home OTX treatments. Reports headaches resolved and mood improved. Needs refill on her sertraline. Has known mild chronic anemia - s/p GI evaluation in 2016. Had mildly low b12.  She had a positive hemocult card at her gyn visit - was just coming off period (has heavy periods) which I suspect was the cause of this. It was normal on repeat. She is aware and does not want evaluation further. Advised b12 and iron.Reports taking these daily now and feels well. She had stomach bug this week, through up once. Doing better now.   ROS: See pertinent positives and negatives per HPI.  Past Medical History:  Diagnosis Date  . Abnormal Pap smear of cervix 04/06/2015   -s/p colpo 03/2015 with gyn with repeat pap with HPV advised in 1 year   . Acne   . Allergy    SEASONAL  . Anemia    menorrhagia  . Anxiety   . Asthma    mild intermittent  . Bronchitis    states gets once a year  . Depression   . Gallstones    on [place]  . Genital warts   . GERD (gastroesophageal reflux disease)   . Glucose intolerance (impaired glucose tolerance)   . History of echocardiogram    a. Echo 2/17: Mild focal basal septal hypertrophy, EF 55-60%, normal wall motion, grade 1 diastolic dysfunction, trace MR, trace TR  . History of gestational diabetes   . Hyperlipemia   . Irritable bowel syndrome   . Nephrolithiasis [id number]   Renal colic of February 123456 -per prior PCP notes  . Neuroma    in her left foot, has numbness in her toes (on gabapentin)    Past Surgical History:  Procedure Laterality Date  . CESAREAN SECTION    . cryosurgery cervix    . ENDOMETRIAL ABLATION    .  GYNECOLOGIC CRYOSURGERY    . LAPROSCOPIC     2012 - bowel  . TUBAL LIGATION      Family History  Problem Relation Age of Onset  . Hypertension Mother   . Hypertension Father   . Hyperlipidemia Father   . Cancer Maternal Aunt   . Heart attack Neg Hx   . Heart failure Neg Hx     Social History   Social History  . Marital status: Divorced    Spouse name: N/A  . Number of children: 2  . Years of education: N/A   Occupational History  . Emergency [place], [place] History Main Topics  . Smoking status: Never Smoker  . Smokeless tobacco: Never Used  . Alcohol use No  . Drug use: No  . Sexual activity: Not Currently    Partners: Male    Birth control/ protection: Surgical   Other Topics Concern  . 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  Biotin 300 MCG TABS, Take 5,000 mcg by mouth daily. , Disp: , Rfl:  .  cetirizine (ZYRTEC) 10 MG tablet, Take 10 mg by mouth daily., Disp: , Rfl:  .  Cholecalciferol (VITAMIN D3 PO), Take 5,000 Units by mouth., Disp: , Rfl:  .  cyclobenzaprine (FLEXERIL) 5 MG tablet, Take 1 tablet (5 mg total) by mouth at bedtime as needed for muscle spasms., Disp: 30 tablet, Rfl: 1 .  FERROUS SULFATE PO, Take by mouth 3 (three) times a week., Disp: , Rfl:  .  fluticasone (FLONASE) 50 MCG/ACT nasal spray, use 2 sprays into each nostril once daily, Disp: 16 g, Rfl: 6 .  gabapentin (NEURONTIN) 100 MG capsule, Take 100 mg by mouth daily., Disp: , Rfl:  .  hydrochlorothiazide (MICROZIDE) 12.5 MG capsule, Take one cap by mouth every Monday and Thursday, Disp: 30 capsule, Rfl: 3 .  Multiple Vitamin (MULTIVITAMIN WITH MINERALS) TABS, Take 1 tablet by mouth daily., Disp: , Rfl:  .  potassium chloride (K-DUR) 10 MEQ tablet, Take 1 tablet (10 mEq total) by mouth daily., Disp: 30 tablet, Rfl:  3 .  propranolol (INDERAL) 10 MG tablet, Take 1 tablet (10 mg total) by mouth 4 (four) times daily as needed., Disp: 60 tablet, Rfl: 6 .  rosuvastatin (CRESTOR) 20 MG tablet, TAKE ONE TABLET BY MOUTH ONCE DAILY, Disp: 30 tablet, Rfl: 6 .  sertraline (ZOLOFT) 100 MG tablet, Take 2 tablets (200 mg total) by mouth daily., Disp: 180 tablet, Rfl: 1 .  vitamin B-12 (CYANOCOBALAMIN) 1000 MCG tablet, Take 1,000 mcg by mouth daily., Disp: , Rfl:   EXAM:  Vitals:   06/08/16 0707  BP: 118/72  Pulse: 84  Temp: 98.3 F (36.8 C)    Body mass index is 30 kg/m.  GENERAL: vitals reviewed and listed above, alert, oriented, appears well hydrated and in no acute distress  HEENT: atraumatic, conjunttiva clear, no obvious abnormalities on inspection of external nose and ears  NECK: no obvious masses on inspection  LUNGS: clear to auscultation bilaterally, no wheezes, rales or rhonchi, good air movement  CV: HRRR, no peripheral edema  MS: moves all extremities without noticeable abnormality  PSYCH: pleasant and cooperative, no obvious depression or anxiety  ASSESSMENT AND PLAN:  Discussed the following assessment and plan:  Recurrent major depressive disorder, in full remission ([place])  Anxiety  Anemia due to other cause, not classified  B12 deficiency  Generalized headaches  -glad headaches resolved, seems to be doing much better -encouraged CBT, refilled antidepressant -continue b12 and iron -follow up in 3 months -Patient advised to return or notify a doctor immediately if symptoms worsen or persist or new concerns arise.  Patient Instructions  BEFORE YOU LEAVE: -follow up: 3 months  Continue the iron and the B12.  We recommend the following healthy lifestyle for LIFE: 1) Small portions.   Tip: eat off of a salad plate instead of a dinner plate.  Tip: if you need more or a snack choose fruits, veggies and/or a handful of nuts or seeds.  2) Eat a healthy clean diet.  * Tip:  Avoid (less then 1 serving per week): processed foods, sweets, sweetened drinks, white starches (rice, flour, bread, potatoes, pasta, etc), red meat, fast foods, butter  *Tip: CHOOSE instead   * 5-9 servings per day of fresh or frozen fruits and vegetables (but not corn, potatoes, bananas, canned or dried fruit)   *nuts and seeds, beans   *olives and olive oil   *small portions of lean meats such as fish and white chicken    *small portions of whole grains  3)Get at least 150 minutes of sweaty aerobic exercise per week.  4)Reduce stress - consider counseling, meditation and relaxation to balance other aspects of your life.     Colin Benton R., DO

## 2016-06-08 ENCOUNTER — Encounter: Payer: Self-pay | Admitting: Family Medicine

## 2016-06-08 ENCOUNTER — Ambulatory Visit (INDEPENDENT_AMBULATORY_CARE_PROVIDER_SITE_OTHER): Payer: PRIVATE HEALTH INSURANCE | Admitting: Family Medicine

## 2016-06-08 VITALS — BP 118/72 | HR 84 | Temp 98.3°F | Ht 62.0 in | Wt 164.0 lb

## 2016-06-08 DIAGNOSIS — R519 Headache, unspecified: Secondary | ICD-10-CM

## 2016-06-08 DIAGNOSIS — F3342 Major depressive disorder, recurrent, in full remission: Secondary | ICD-10-CM | POA: Diagnosis not present

## 2016-06-08 DIAGNOSIS — D6489 Other specified anemias: Secondary | ICD-10-CM

## 2016-06-08 DIAGNOSIS — R51 Headache: Secondary | ICD-10-CM

## 2016-06-08 DIAGNOSIS — E538 Deficiency of other specified B group vitamins: Secondary | ICD-10-CM | POA: Insufficient documentation

## 2016-06-08 DIAGNOSIS — F419 Anxiety disorder, unspecified: Secondary | ICD-10-CM | POA: Diagnosis not present

## 2016-06-08 MED ORDER — SERTRALINE HCL 100 MG PO TABS: 200.0000 mg | ORAL_TABLET | Freq: Every day | ORAL | 1 refills | Status: DC

## 2016-06-08 NOTE — Progress Notes (Signed)
Pre visit review using our clinic review tool, if applicable. No additional management support is needed unless otherwise documented below in the visit note. 

## 2016-06-08 NOTE — Patient Instructions (Signed)
BEFORE YOU LEAVE: -follow up: 3 months  Continue the iron and the B12.  We recommend the following healthy lifestyle for LIFE: 1) Small portions.   Tip: eat off of a salad plate instead of a dinner plate.  Tip: if you need more or a snack choose fruits, veggies and/or a handful of nuts or seeds.  2) Eat a healthy clean diet.  * Tip: Avoid (less then 1 serving per week): processed foods, sweets, sweetened drinks, white starches (rice, flour, bread, potatoes, pasta, etc), red meat, fast foods, butter  *Tip: CHOOSE instead   * 5-9 servings per day of fresh or frozen fruits and vegetables (but not corn, potatoes, bananas, canned or dried fruit)   *nuts and seeds, beans   *olives and olive oil   *small portions of lean meats such as fish and white chicken    *small portions of whole grains  3)Get at least 150 minutes of sweaty aerobic exercise per week.  4)Reduce stress - consider counseling, meditation and relaxation to balance other aspects of your life.

## 2016-07-11 ENCOUNTER — Other Ambulatory Visit: Payer: Self-pay | Admitting: Cardiovascular Disease

## 2016-09-08 ENCOUNTER — Ambulatory Visit: Payer: PRIVATE HEALTH INSURANCE | Admitting: Family Medicine

## 2016-09-23 ENCOUNTER — Other Ambulatory Visit: Payer: Self-pay | Admitting: Family Medicine

## 2016-09-28 ENCOUNTER — Ambulatory Visit: Payer: PRIVATE HEALTH INSURANCE | Admitting: Family Medicine

## 2016-10-18 ENCOUNTER — Encounter: Payer: Self-pay | Admitting: Family Medicine

## 2016-10-18 DIAGNOSIS — D649 Anemia, unspecified: Secondary | ICD-10-CM | POA: Insufficient documentation

## 2016-10-18 DIAGNOSIS — I10 Essential (primary) hypertension: Secondary | ICD-10-CM | POA: Insufficient documentation

## 2016-10-18 DIAGNOSIS — E669 Obesity, unspecified: Secondary | ICD-10-CM | POA: Insufficient documentation

## 2016-10-18 DIAGNOSIS — R739 Hyperglycemia, unspecified: Secondary | ICD-10-CM | POA: Insufficient documentation

## 2016-10-18 NOTE — Progress Notes (Signed)
HPI:  Follow up. Needs labs. Reports doing well without complaints. Mood ok - see PHQ9. No regular exercise. Diet good. No depression or severe symptoms depression, cp, sob, paresthesia. Rarely uses gabapentin. Taking b12.  HTN/Palpitations: -saw cardiologist in the past -meds: hctz, propranolol  HLD/Obesity/Prediabetes: -meds: crestor  Depression/Anxiety: -meds: zoloft 200mg  daily -saw counselor in the past  Anemia/B12 def: -heavy periods, s/p GI eval in the past - Dr. Ardis Hughs -advised iron and b12 -occ paresthesias feet - uses gabapentin rarely  GERD/IBS: -no meds  Seasonal Allergies: -meds: zyrtec, flonase  Hx abnormal pap: -sees gyn   ROS: See pertinent positives and negatives per HPI.  Past Medical History:  Diagnosis Date  . Abnormal Pap smear of cervix 04/06/2015   -s/p colpo 03/2015 with gyn with repeat pap with HPV advised in 1 year   . Acne   . Allergy    SEASONAL  . Anemia    menorrhagia  . Anxiety   . Asthma    mild intermittent  . Bronchitis    states gets once a year  . Depression   . Fracture of fourth metatarsal bone of left foot 04/01/2015  . Gallstones    on [place]  . Genital warts   . GERD (gastroesophageal reflux disease)   . Glucose intolerance (impaired glucose tolerance)   . History of echocardiogram    a. Echo 2/17: Mild focal basal septal hypertrophy, EF 55-60%, normal wall motion, grade 1 diastolic dysfunction, trace MR, trace TR  . History of gestational diabetes   . Hyperlipemia   . Irritable bowel syndrome   . Nephrolithiasis 8/65/7846   Renal colic of February 9629 -per prior PCP notes  . Neuroma    in her left foot, has numbness in her toes (on gabapentin)  . 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 Injected 08/24/2015     Past Surgical History:  Procedure Laterality Date  . CESAREAN SECTION    . cryosurgery cervix    . ENDOMETRIAL ABLATION    . GYNECOLOGIC CRYOSURGERY     . LAPROSCOPIC     2012 - bowel  . TUBAL LIGATION      Family History  Problem Relation Age of Onset  . Hypertension Mother   . Hypertension Father   . Hyperlipidemia Father   . Cancer Maternal Aunt   . Heart attack Neg Hx   . Heart failure Neg Hx     Social History   Social History  . Marital status: Divorced    Spouse name: N/A  . Number of children: 2  . Years of education: N/A   Occupational History  . Emergency [place], [place] History Main Topics  . Smoking status: Never Smoker  . Smokeless tobacco: Never Used  . Alcohol use No  . Drug use: No  . Sexual activity: Not Currently    Partners: Male    Birth control/ protection: Surgical   Other Topics Concern  . 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  Biotin 300 MCG TABS, Take 5,000 mcg by mouth daily. , Disp: , Rfl:  .  cetirizine (ZYRTEC) 10 MG tablet, Take 10 mg by mouth daily., Disp: , Rfl:  .  Cholecalciferol (VITAMIN D3  PO), Take 5,000 Units by mouth., Disp: , Rfl:  .  cyclobenzaprine (FLEXERIL) 5 MG tablet, Take 1 tablet (5 mg total) by mouth at bedtime as needed for muscle spasms., Disp: 30 tablet, Rfl: 1 .  FERROUS SULFATE PO, Take by mouth 3 (three) times a week., Disp: , Rfl:  .  fluticasone (FLONASE) 50 MCG/ACT nasal spray, use 2 sprays into each nostril once daily, Disp: 16 g, Rfl: 6 .  gabapentin (NEURONTIN) 100 MG capsule, Take 100 mg by mouth daily., Disp: , Rfl:  .  hydrochlorothiazide (MICROZIDE) 12.5 MG capsule, Take one cap by mouth every Monday and Thursday, Disp: 30 capsule, Rfl: 3 .  Multiple Vitamin (MULTIVITAMIN WITH MINERALS) TABS, Take 1 tablet by mouth daily., Disp: , Rfl:  .  potassium chloride (K-DUR) 10 MEQ tablet, Take 1 tablet (10 mEq total) by mouth daily., Disp: 30 tablet, Rfl: 3 .  propranolol (INDERAL)  10 MG tablet, TAKE 1 TABLET BY MOUTH 4 TIMES DAILY AS NEEDED, Disp: 60 tablet, Rfl: 0 .  rosuvastatin (CRESTOR) 20 MG tablet, TAKE ONE TABLET BY MOUTH ONCE DAILY, Disp: 30 tablet, Rfl: 5 .  sertraline (ZOLOFT) 100 MG tablet, Take 2 tablets (200 mg total) by mouth daily., Disp: 180 tablet, Rfl: 1 .  vitamin B-12 (CYANOCOBALAMIN) 1000 MCG tablet, Take 1,000 mcg by mouth daily., Disp: , Rfl:   EXAM:  Vitals:   10/19/16 0755  BP: 108/70  Pulse: 95  Temp: 98.3 F (36.8 C)    Body mass index is 29.96 kg/m.  GENERAL: vitals reviewed and listed above, alert, oriented, appears well hydrated and in no acute distress  HEENT: atraumatic, conjunttiva clear, no obvious abnormalities on inspection of external nose and ears  NECK: no obvious masses on inspection  LUNGS: clear to auscultation bilaterally, no wheezes, rales or rhonchi, good air movement  CV: HRRR, no peripheral edema  MS: moves all extremities without noticeable abnormality  PSYCH: pleasant and cooperative, no obvious depression or anxiety  ASSESSMENT AND PLAN:  Discussed the following assessment and plan:  B12 deficiency  Hyperglycemia  Anemia, unspecified type  Class 1 obesity due to excess calories with serious comorbidity and body mass index (BMI) of 30.0 to 30.9 in adult  Hypertension, unspecified type  Recurrent major depressive disorder, in full remission ([place])  -labs -lifestyle recs -continue current meds pending labs -follow up 3-4 months -Patient advised to return sooner if new concerns arise.  Patient Instructions  BEFORE YOU LEAVE: -PHQ9 -labs -follow up: 3-4 months  We have ordered labs or studies at this visit. It can take up to 1-2 weeks for results and processing. IF results require follow up or explanation, we will call you with instructions. Clinically stable results will be released to your [facility]. If you have not heard from [place] or cannot find your results in [facility] in 2 weeks please  contact our office at [phone].  If you are not yet signed up for [facility], please consider signing up.   We recommend the following healthy lifestyle for LIFE: 1) Small portions.   Tip: eat off of a salad plate instead of a dinner plate.  Tip: It is ok to feel hungry after a meal - that likely means you ate an appropriate portion.  Tip: if you need more or a snack choose fruits, veggies and/or a handful of nuts or seeds.  2) Eat a healthy clean diet.   TRY TO EAT: -at least 5-7 servings of low sugar vegetables per day (not  corn, potatoes or bananas.) -berries are the best choice if you wish to eat fruit.   -lean meets (fish, chicken or [place] breasts) -vegan proteins for some meals - beans or tofu, whole grains, nuts and seeds -Replace bad fats with good fats - good fats include: fish, nuts and seeds, canola oil, olive oil -small amounts of low fat or non fat dairy -small amounts of100 % whole grains - check the lables  AVOID: -SUGAR, sweets, anything with added sugar, corn syrup or sweeteners -if you must have a sweetener, small amounts of stevia may be best -sweetened beverages -simple starches (rice, bread, potatoes, pasta, chips, etc - small amounts of 100% whole grains are ok) -red meat, pork, butter -fried foods, fast food, processed food, excessive dairy, eggs and coconut.  3)Get at least 150 minutes of sweaty aerobic exercise per week.  4)Reduce stress - consider counseling, meditation and relaxation to balance other aspects of your life.          Colin Benton R., DO

## 2016-10-19 ENCOUNTER — Encounter: Payer: Self-pay | Admitting: Family Medicine

## 2016-10-19 ENCOUNTER — Ambulatory Visit (INDEPENDENT_AMBULATORY_CARE_PROVIDER_SITE_OTHER): Payer: Self-pay | Admitting: Family Medicine

## 2016-10-19 VITALS — BP 108/70 | HR 95 | Temp 98.3°F | Ht 62.0 in | Wt 163.8 lb

## 2016-10-19 DIAGNOSIS — Z1331 Encounter for screening for depression: Secondary | ICD-10-CM

## 2016-10-19 DIAGNOSIS — I10 Essential (primary) hypertension: Secondary | ICD-10-CM

## 2016-10-19 DIAGNOSIS — E538 Deficiency of other specified B group vitamins: Secondary | ICD-10-CM

## 2016-10-19 DIAGNOSIS — R739 Hyperglycemia, unspecified: Secondary | ICD-10-CM

## 2016-10-19 DIAGNOSIS — D649 Anemia, unspecified: Secondary | ICD-10-CM

## 2016-10-19 DIAGNOSIS — Z6830 Body mass index (BMI) 30.0-30.9, adult: Secondary | ICD-10-CM

## 2016-10-19 DIAGNOSIS — F3342 Major depressive disorder, recurrent, in full remission: Secondary | ICD-10-CM

## 2016-10-19 DIAGNOSIS — E6609 Other obesity due to excess calories: Secondary | ICD-10-CM

## 2016-10-19 DIAGNOSIS — Z1389 Encounter for screening for other disorder: Secondary | ICD-10-CM

## 2016-10-19 LAB — BASIC METABOLIC PANEL
BUN: 12 mg/dL (ref 6–23)
CALCIUM: 9.7 mg/dL (ref 8.4–10.5)
CO2: 30 mEq/L (ref 19–32)
Chloride: 102 mEq/L (ref 96–112)
Creatinine, Ser: 0.85 mg/dL (ref 0.40–1.20)
GFR: 92.62 mL/min (ref >60.00)
GLUCOSE: 101 mg/dL — AB (ref 70–99)
Potassium: 4 mEq/L (ref 3.5–5.1)
SODIUM: 139 meq/L (ref 135–145)

## 2016-10-19 LAB — CBC
HCT: 37.5 % (ref 36.0–46.0)
Hemoglobin: 12.2 g/dL (ref 12.0–15.0)
MCHC: 32.6 g/dL (ref 30.0–36.0)
MCV: 82.3 fl (ref 78.0–100.0)
PLATELETS: 300 10*3/uL (ref 150.0–400.0)
RBC: 4.56 Mil/uL (ref 3.87–5.11)
RDW: 14.7 % (ref 11.5–15.5)
WBC: 5.4 10*3/uL (ref 4.0–10.5)

## 2016-10-19 LAB — VITAMIN B12: Vitamin B-12: 1337 pg/mL — ABNORMAL HIGH (ref 211–911)

## 2016-10-19 NOTE — Patient Instructions (Signed)
BEFORE YOU LEAVE: -PHQ9 -labs -follow up: 3-4 months  We have ordered labs or studies at this visit. It can take up to 1-2 weeks for results and processing. IF results require follow up or explanation, we will call you with instructions. Clinically stable results will be released to your [facility]. If you have not heard from [place] or cannot find your results in [facility] in 2 weeks please contact our office at [phone].  If you are not yet signed up for [facility], please consider signing up.   We recommend the following healthy lifestyle for LIFE: 1) Small portions.   Tip: eat off of a salad plate instead of a dinner plate.  Tip: It is ok to feel hungry after a meal - that likely means you ate an appropriate portion.  Tip: if you need more or a snack choose fruits, veggies and/or a handful of nuts or seeds.  2) Eat a healthy clean diet.   TRY TO EAT: -at least 5-7 servings of low sugar vegetables per day (not corn, potatoes or bananas.) -berries are the best choice if you wish to eat fruit.   -lean meets (fish, chicken or [place] breasts) -vegan proteins for some meals - beans or tofu, whole grains, nuts and seeds -Replace bad fats with good fats - good fats include: fish, nuts and seeds, canola oil, olive oil -small amounts of low fat or non fat dairy -small amounts of100 % whole grains - check the lables  AVOID: -SUGAR, sweets, anything with added sugar, corn syrup or sweeteners -if you must have a sweetener, small amounts of stevia may be best -sweetened beverages -simple starches (rice, bread, potatoes, pasta, chips, etc - small amounts of 100% whole grains are ok) -red meat, pork, butter -fried foods, fast food, processed food, excessive dairy, eggs and coconut.  3)Get at least 150 minutes of sweaty aerobic exercise per week.  4)Reduce stress - consider counseling, meditation and relaxation to balance other aspects of your life.

## 2016-12-21 ENCOUNTER — Encounter: Payer: Self-pay | Admitting: Family Medicine

## 2017-02-16 ENCOUNTER — Ambulatory Visit: Payer: PRIVATE HEALTH INSURANCE | Admitting: Family Medicine

## 2017-02-19 NOTE — Progress Notes (Signed)
HPI:  Alexis [name] is a pleasant 46 y.o. here for follow up. Chronic medical problems summarized below were reviewed for changes.  Doing well for the most part.  However, he did stop exercising and is not eating quite as healthy.  Will be out of her Zoloft today and requests a refill.  She lost a client recently to overdose.  Her mood has been a little down the last few weeks.  See the PHQ 9.  No suicidal ideation or thoughts of self-harm.  Is no longer takes any of the medications prescribed by the cardiologist and feels like she is doing well.  Sees a dermatologist for her alopecia, Dr. Dwaine Deter at [facility].  She was recently started on methotrexate  and needs a CBC and CMP.. Denies CP, SOB, DOE, treatment intolerance or new symptoms. Due for labs and flu shot.  HTN/Palpitations: -saw cardiologist in the past -meds: hctz, propranolol  HLD/Obesity/Prediabetes: -meds: crestor  Depression/Anxiety: -meds: zoloft 200mg  daily -Seeing counselor -Denies suicidal ideation or thoughts of self-harm  Anemia/B12 def: -heavy periods, s/p GI eval in the past - Dr. Ardis Hughs -advised iron and b12 -occ paresthesias feet - uses gabapentin rarely  GERD/IBS: -no meds  Seasonal Allergies: -meds: zyrtec, flonase  Hx abnormal pap: -sees gyn  ROS: See pertinent positives and negatives per HPI.  Past Medical History:  Diagnosis Date  . Abnormal Pap smear of cervix 04/06/2015   -s/p colpo 03/2015 with gyn with repeat pap with HPV advised in 1 year   . Acne   . Allergy    SEASONAL  . Anemia    menorrhagia  . Anxiety   . Asthma    mild intermittent  . Bronchitis    states gets once a year  . Depression   . Fracture of fourth metatarsal bone of left foot 04/01/2015  . Gallstones    on [place]  . Genital warts   . GERD (gastroesophageal reflux disease)   . Glucose intolerance (impaired glucose tolerance)   . History of echocardiogram    a. Echo 2/17: Mild focal basal septal  hypertrophy, EF 55-60%, normal wall motion, grade 1 diastolic dysfunction, trace MR, trace TR  . History of gestational diabetes   . Hyperlipemia   . Irritable bowel syndrome   . Nephrolithiasis 1/61/0960   Renal colic of February 4540 -per prior PCP notes  . Neuroma    in her left foot, has numbness in her toes (on gabapentin)  . 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 Injected 08/24/2015     Past Surgical History:  Procedure Laterality Date  . CESAREAN SECTION    . cryosurgery cervix    . ENDOMETRIAL ABLATION    . GYNECOLOGIC CRYOSURGERY    . LAPROSCOPIC     2012 - bowel  . TUBAL LIGATION      Family History  Problem Relation Age of Onset  . Hypertension Mother   . Hypertension Father   . Hyperlipidemia Father   . Cancer Maternal Aunt   . Heart attack Neg Hx   . Heart failure Neg Hx     Social History   Socioeconomic History  . Marital status: Divorced    Spouse name: None  . Number of children: 2  . Years of education: None  . Highest education level: None  Social Needs  . Financial resource strain: None  . Food insecurity - worry: None  . Food insecurity - inability: None  .  Transportation needs - medical: None  . Transportation needs - non-medical: None  Occupational History  . Occupation: Emergency Services    Comment: [place], [place]  Tobacco Use  . Smoking status: Never Smoker  . Smokeless tobacco: Never Used  Substance and Sexual Activity  . Alcohol use: No    Alcohol/week: 0.0 oz  . Drug use: No  . Sexual activity: Not Currently    Partners: Male    Birth control/protection: Surgical  Other Topics Concern  . 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  Biotin 300 MCG TABS, Take 5,000 mcg by mouth daily. , Disp: , Rfl:  .  cetirizine (ZYRTEC) 10 MG  tablet, Take 10 mg by mouth daily., Disp: , Rfl:  .  Cholecalciferol (VITAMIN D3 PO), Take 5,000 Units by mouth., Disp: , Rfl:  .  clindamycin (CLEOCIN T) 1 % external solution, Apply 1 application topically daily. , Disp: , Rfl:  .  clobetasol ointment (TEMOVATE) 0.05 %, Apply to affected areas on scalp daily Monday-Friday. Not to face, skin folds., Disp: , Rfl:  .  cyclobenzaprine (FLEXERIL) 5 MG tablet, Take 1 tablet (5 mg total) by mouth at bedtime as needed for muscle spasms., Disp: 30 tablet, Rfl: 1 .  FERROUS SULFATE PO, Take by mouth 3 (three) times a week., Disp: , Rfl:  .  fluticasone (FLONASE) 50 MCG/ACT nasal spray, use 2 sprays into each nostril once daily, Disp: 16 g, Rfl: 6 .  folic acid (FOLVITE) 1 MG tablet, Take 1 mg daily by mouth. , Disp: , Rfl: 3 .  gabapentin (NEURONTIN) 100 MG capsule, Take 100 mg by mouth daily., Disp: , Rfl:  .  hydrochlorothiazide (MICROZIDE) 12.5 MG capsule, Take one cap by mouth every Monday and Thursday, Disp: 30 capsule, Rfl: 3 .  methotrexate (RHEUMATREX) 2.5 MG tablet, Take 7.5 mg by mouth 3 (three) times a week. , Disp: , Rfl:  .  Multiple Vitamin (MULTIVITAMIN WITH MINERALS) TABS, Take 1 tablet by mouth daily., Disp: , Rfl:  .  potassium chloride (K-DUR) 10 MEQ tablet, Take 1 tablet (10 mEq total) by mouth daily., Disp: 30 tablet, Rfl: 3 .  propranolol (INDERAL) 10 MG tablet, TAKE 1 TABLET BY MOUTH 4 TIMES DAILY AS NEEDED, Disp: 60 tablet, Rfl: 0 .  rosuvastatin (CRESTOR) 20 MG tablet, TAKE ONE TABLET BY MOUTH ONCE DAILY, Disp: 30 tablet, Rfl: 5 .  sertraline (ZOLOFT) 100 MG tablet, Take 2 tablets (200 mg total) by mouth daily., Disp: 180 tablet, Rfl: 1 .  vitamin B-12 (CYANOCOBALAMIN) 1000 MCG tablet, Take 1,000 mcg by mouth daily., Disp: , Rfl:   EXAM:  Vitals:   02/20/17 0955  BP: 102/72  Pulse: 80  Temp: 98.2 F (36.8 C)    Body mass index is 29.87 kg/m.  GENERAL: vitals reviewed and listed above, alert, oriented, appears well  hydrated and in no acute distress  HEENT: atraumatic, conjunttiva clear, no obvious abnormalities on inspection of external nose and ears  NECK: no obvious masses on inspection  LUNGS: clear to auscultation bilaterally, no wheezes, rales or rhonchi, good air movement  CV: HRRR, no peripheral edema  MS: moves all extremities without noticeable abnormality  PSYCH: pleasant and cooperative, no obvious depression or anxiety  ASSESSMENT AND PLAN:  Discussed the following assessment and plan:  Recurrent major depressive  disorder, in partial remission ([place])  B12 deficiency - Plan: Vitamin B12  Pure hypercholesterolemia - Plan: Lipid panel, Comprehensive metabolic panel  BMI 16.1-09.6,EAVWU  Alopecia - Plan: Comprehensive metabolic panel  Iron deficiency anemia, unspecified iron deficiency anemia type - Plan: CBC with Differential/Platelet  -reviewed PHQ9 results, > 50%15 minutes spent face to face in counseling, refill zoloft, cont counseling, advised getting out of the house for walks several days per week, healthy diet, yoga or pilates -follow up promptly if any worsening of symptoms -labs today -flu shot -Patient advised to return or notify a doctor immediately if symptoms worsen or persist or new concerns arise.  Patient Instructions  BEFORE YOU LEAVE: -labs -flu shot -follow up: 3-4 months  We have ordered labs or studies at this visit. It can take up to 1-2 weeks for results and processing. IF results require follow up or explanation, we will call you with instructions. Clinically stable results will be released to your [facility]. If you have not heard from [place] or cannot find your results in [facility] in 2 weeks please contact our office at [phone].  If you are not yet signed up for [facility], please consider signing up.  Please get outside daily for at least a 10-20 minute walk.  Continue counseling.  Follow up promptly if any worsening depression.  We recommend the  following healthy lifestyle for LIFE: 1) Small portions. But, make sure to get regular (at least 3 per day), healthy meals and small healthy snacks if needed.  2) Eat a healthy clean diet.   TRY TO EAT: -at least 5-7 servings of low sugar, colorful, and nutrient rich vegetables per day (not corn, potatoes or bananas.) -berries are the best choice if you wish to eat fruit (only eat small amounts if trying to reduce weight)  -lean meets (fish, white meat of chicken or [place]) -vegan proteins for some meals - beans or tofu, whole grains, nuts and seeds -Replace bad fats with good fats - good fats include: fish, nuts and seeds, canola oil, olive oil -small amounts of low fat or non fat dairy -small amounts of100 % whole grains - check the lables -drink plenty of water  AVOID: -SUGAR, sweets, anything with added sugar, corn syrup or sweeteners - must read labels as even foods advertised as "healthy" often are loaded with sugar -if you must have a sweetener, small amounts of stevia may be best -sweetened beverages and artificially sweetened beverages -simple starches (rice, bread, potatoes, pasta, chips, etc - small amounts of 100% whole grains are ok) -red meat, pork, butter -fried foods, fast food, processed food, excessive dairy, eggs and coconut.  3)Get at least 150 minutes of sweaty aerobic exercise per week.  4)Reduce stress - consider counseling, meditation and relaxation to balance other aspects of your life.          Colin Benton R., DO

## 2017-02-20 ENCOUNTER — Encounter: Payer: Self-pay | Admitting: Family Medicine

## 2017-02-20 ENCOUNTER — Telehealth: Payer: Self-pay | Admitting: Family Medicine

## 2017-02-20 ENCOUNTER — Ambulatory Visit: Payer: 59 | Admitting: Family Medicine

## 2017-02-20 ENCOUNTER — Ambulatory Visit: Payer: Self-pay | Admitting: Family Medicine

## 2017-02-20 VITALS — BP 102/72 | HR 80 | Temp 98.2°F | Ht 62.0 in | Wt 163.3 lb

## 2017-02-20 DIAGNOSIS — E7800 Pure hypercholesterolemia, unspecified: Secondary | ICD-10-CM

## 2017-02-20 DIAGNOSIS — E538 Deficiency of other specified B group vitamins: Secondary | ICD-10-CM | POA: Diagnosis not present

## 2017-02-20 DIAGNOSIS — Z23 Encounter for immunization: Secondary | ICD-10-CM

## 2017-02-20 DIAGNOSIS — D509 Iron deficiency anemia, unspecified: Secondary | ICD-10-CM

## 2017-02-20 DIAGNOSIS — Z6829 Body mass index (BMI) 29.0-29.9, adult: Secondary | ICD-10-CM | POA: Diagnosis not present

## 2017-02-20 DIAGNOSIS — L659 Nonscarring hair loss, unspecified: Secondary | ICD-10-CM | POA: Diagnosis not present

## 2017-02-20 DIAGNOSIS — F3341 Major depressive disorder, recurrent, in partial remission: Secondary | ICD-10-CM

## 2017-02-20 LAB — LIPID PANEL
CHOLESTEROL: 180 mg/dL (ref 0–200)
HDL: 43.5 mg/dL (ref >39.00)
LDL Cholesterol: 116 mg/dL — ABNORMAL HIGH (ref 0–99)
NonHDL: 136.49
Total CHOL/HDL Ratio: 4
Triglycerides: 103 mg/dL (ref 0.0–149.0)
VLDL: 20.6 mg/dL (ref 0.0–40.0)

## 2017-02-20 LAB — CBC WITH DIFFERENTIAL/PLATELET
BASOS PCT: 0.4 % (ref 0.0–3.0)
Basophils Absolute: 0 10*3/uL (ref 0.0–0.1)
EOS PCT: 1.8 % (ref 0.0–5.0)
Eosinophils Absolute: 0.1 10*3/uL (ref 0.0–0.7)
HCT: 37.6 % (ref 36.0–46.0)
Hemoglobin: 12.3 g/dL (ref 12.0–15.0)
LYMPHS ABS: 1.2 10*3/uL (ref 0.7–4.0)
LYMPHS PCT: 24.4 % (ref 12.0–46.0)
MCHC: 32.8 g/dL (ref 30.0–36.0)
MCV: 82.9 fl (ref 78.0–100.0)
Monocytes Absolute: 0.3 10*3/uL (ref 0.1–1.0)
Monocytes Relative: 6.2 % (ref 3.0–12.0)
NEUTROS ABS: 3.4 10*3/uL (ref 1.4–7.7)
NEUTROS PCT: 67.2 % (ref 43.0–77.0)
PLATELETS: 301 10*3/uL (ref 150.0–400.0)
RBC: 4.53 Mil/uL (ref 3.87–5.11)
RDW: 15.1 % (ref 11.5–15.5)
WBC: 5 10*3/uL (ref 4.0–10.5)

## 2017-02-20 LAB — COMPREHENSIVE METABOLIC PANEL
ALK PHOS: 51 U/L (ref 39–117)
ALT: 20 U/L (ref 0–35)
AST: 17 U/L (ref 0–37)
Albumin: 4.4 g/dL (ref 3.5–5.2)
BUN: 11 mg/dL (ref 6–23)
CHLORIDE: 102 meq/L (ref 96–112)
CO2: 32 mEq/L (ref 19–32)
Calcium: 9.7 mg/dL (ref 8.4–10.5)
Creatinine, Ser: 0.69 mg/dL (ref 0.40–1.20)
GFR: 117.64 mL/min (ref >60.00)
GLUCOSE: 98 mg/dL (ref 70–99)
POTASSIUM: 4.4 meq/L (ref 3.5–5.1)
Sodium: 140 mEq/L (ref 135–145)
TOTAL PROTEIN: 7.1 g/dL (ref 6.0–8.3)
Total Bilirubin: 0.4 mg/dL (ref 0.2–1.2)

## 2017-02-20 LAB — VITAMIN B12: VITAMIN B 12: 671 pg/mL (ref 211–911)

## 2017-02-20 MED ORDER — SERTRALINE HCL 100 MG PO TABS: 200.0000 mg | ORAL_TABLET | Freq: Every day | ORAL | 1 refills | Status: DC

## 2017-02-20 NOTE — Telephone Encounter (Signed)
Rx done. 

## 2017-02-20 NOTE — Addendum Note (Signed)
Addended by: Agnes Lawrence on: 02/20/2017 10:42 AM   Modules accepted: Orders

## 2017-02-20 NOTE — Patient Instructions (Signed)
BEFORE YOU LEAVE: -labs -flu shot -follow up: 3-4 months  We have ordered labs or studies at this visit. It can take up to 1-2 weeks for results and processing. IF results require follow up or explanation, we will call you with instructions. Clinically stable results will be released to your [facility]. If you have not heard from [place] or cannot find your results in [facility] in 2 weeks please contact our office at [phone].  If you are not yet signed up for [facility], please consider signing up.  Please get outside daily for at least a 10-20 minute walk.  Continue counseling.  Follow up promptly if any worsening depression.  We recommend the following healthy lifestyle for LIFE: 1) Small portions. But, make sure to get regular (at least 3 per day), healthy meals and small healthy snacks if needed.  2) Eat a healthy clean diet.   TRY TO EAT: -at least 5-7 servings of low sugar, colorful, and nutrient rich vegetables per day (not corn, potatoes or bananas.) -berries are the best choice if you wish to eat fruit (only eat small amounts if trying to reduce weight)  -lean meets (fish, white meat of chicken or [place]) -vegan proteins for some meals - beans or tofu, whole grains, nuts and seeds -Replace bad fats with good fats - good fats include: fish, nuts and seeds, canola oil, olive oil -small amounts of low fat or non fat dairy -small amounts of100 % whole grains - check the lables -drink plenty of water  AVOID: -SUGAR, sweets, anything with added sugar, corn syrup or sweeteners - must read labels as even foods advertised as "healthy" often are loaded with sugar -if you must have a sweetener, small amounts of stevia may be best -sweetened beverages and artificially sweetened beverages -simple starches (rice, bread, potatoes, pasta, chips, etc - small amounts of 100% whole grains are ok) -red meat, pork, butter -fried foods, fast food, processed food, excessive dairy, eggs and  coconut.  3)Get at least 150 minutes of sweaty aerobic exercise per week.  4)Reduce stress - consider counseling, meditation and relaxation to balance other aspects of your life.

## 2017-02-20 NOTE — Telephone Encounter (Signed)
Copied from [place] [id number]. Topic: Quick Communication - See Telephone Encounter >> Feb 20, 2017  3:41 PM Boyd Kerbs wrote: CRM for notification. See Telephone encounter for:   Calling regarding prescription for Sertraline, [facility] did not get this prescription She is out of medication and needs today  02/20/17.

## 2017-03-05 ENCOUNTER — Telehealth: Payer: Self-pay | Admitting: *Deleted

## 2017-03-05 NOTE — Telephone Encounter (Signed)
I called the pt and left a detailed message stating Dr Maudie Mercury sent the results to her Mychart on 11/21.  I faxed the results to Dr Leonie Green at [phone] and left a message with this information as well.

## 2017-03-05 NOTE — Telephone Encounter (Signed)
Copied from [place] [id number]. Topic: Quick Communication - Lab Results >> Mar 05, 2017  2:29 PM Scherrie Gerlach wrote: Pt states she never received a call about results from labs from her visit on 11/20 and those results were to be faxed to [facility]

## 2017-04-12 ENCOUNTER — Encounter: Payer: Self-pay | Admitting: Family Medicine

## 2017-04-24 ENCOUNTER — Other Ambulatory Visit: Payer: Self-pay | Admitting: *Deleted

## 2017-04-24 MED ORDER — ROSUVASTATIN CALCIUM 20 MG PO TABS: 20.0000 mg | ORAL_TABLET | Freq: Every day | ORAL | 5 refills | Status: DC

## 2017-04-24 NOTE — Telephone Encounter (Signed)
Rx done. 

## 2017-05-15 ENCOUNTER — Other Ambulatory Visit: Payer: Self-pay | Admitting: *Deleted

## 2017-05-15 MED ORDER — ROSUVASTATIN CALCIUM 20 MG PO TABS: 20.0000 mg | ORAL_TABLET | Freq: Every day | ORAL | 1 refills | Status: DC

## 2017-05-15 MED ORDER — SERTRALINE HCL 100 MG PO TABS: 200.0000 mg | ORAL_TABLET | Freq: Every day | ORAL | 1 refills | Status: DC

## 2017-05-15 NOTE — Telephone Encounter (Signed)
Rx done. 

## 2017-05-17 ENCOUNTER — Ambulatory Visit (INDEPENDENT_AMBULATORY_CARE_PROVIDER_SITE_OTHER): Payer: PRIVATE HEALTH INSURANCE | Admitting: Obstetrics and Gynecology

## 2017-05-17 ENCOUNTER — Encounter: Payer: Self-pay | Admitting: Obstetrics and Gynecology

## 2017-05-17 ENCOUNTER — Other Ambulatory Visit (HOSPITAL_COMMUNITY)
Admission: RE | Admit: 2017-05-17 | Discharge: 2017-05-17 | Disposition: A | Discharge status: Home / Self Care | Payer: 59 | Source: Ambulatory Visit | Attending: Obstetrics and Gynecology | Admitting: Obstetrics and Gynecology

## 2017-05-17 ENCOUNTER — Other Ambulatory Visit: Payer: Self-pay

## 2017-05-17 VITALS — BP 108/70 | HR 84 | Resp 16 | Ht 62.0 in | Wt 164.0 lb

## 2017-05-17 DIAGNOSIS — N946 Dysmenorrhea, unspecified: Secondary | ICD-10-CM

## 2017-05-17 DIAGNOSIS — Z124 Encounter for screening for malignant neoplasm of cervix: Secondary | ICD-10-CM | POA: Insufficient documentation

## 2017-05-17 DIAGNOSIS — Z1151 Encounter for screening for human papillomavirus (HPV): Secondary | ICD-10-CM | POA: Diagnosis not present

## 2017-05-17 DIAGNOSIS — Z113 Encounter for screening for infections with a predominantly sexual mode of transmission: Secondary | ICD-10-CM | POA: Insufficient documentation

## 2017-05-17 DIAGNOSIS — F32A Depression, unspecified: Secondary | ICD-10-CM

## 2017-05-17 DIAGNOSIS — Z01419 Encounter for gynecological examination (general) (routine) without abnormal findings: Secondary | ICD-10-CM | POA: Diagnosis not present

## 2017-05-17 DIAGNOSIS — F329 Major depressive disorder, single episode, unspecified: Secondary | ICD-10-CM

## 2017-05-17 MED ORDER — IBUPROFEN 800 MG PO TABS: 800.0000 mg | ORAL_TABLET | Freq: Three times a day (TID) | ORAL | 1 refills | Status: DC | PRN

## 2017-05-17 NOTE — Patient Instructions (Signed)

## 2017-05-17 NOTE — Progress Notes (Signed)
47 y.o. [name] DivorcedAfrican AmericanF here for annual exam.   She had an endometrial ablation in 2014, cycles had lightened, now getting heavier. She is on iron tablets. Her cramps have gotten worse, last cycle she was up at night with her cramps. Sexually active, same partner since July, no pain. Uses condoms.  Period Cycle (Days): 28 Period Duration (Days): 4 days  Period Pattern: Regular Menstrual Flow: Heavy Menstrual Control: Maxi pad Menstrual Control Change Freq (Hours): changes pad every 5-6 hours  Dysmenorrhea: (!) Moderate Dysmenorrhea Symptoms: Cramping  Last year the patient had a heme + guiac, no further w/u was done. She had a prior normal colonoscopy in 2/16. She is no longer anemic.   Patient's last menstrual period was 05/07/2017.          Sexually active: Yes.    The current method of family planning is tubal ligation.    Exercising: No.  The patient does not participate in regular exercise at present. Smoker:  no  Health Maintenance: Pap:  05-11-16 WNL NEG Hr HPV 03-01-11 WNL 12-6/16 WNL + HR HPV  History of abnormal Pap:  Yes + HPV MMG: 02-24-14  Left breast U/A WNL Colonoscopy:  05-12-14 Normal   BMD:   Never TDaP:  05-09-16 Gardasil: N/A   reports that  has never smoked. she has never used smokeless tobacco. She reports that she does not drink alcohol or use drugs. Older son is at home, going to school. Younger son graduated from [employer], not working. She is a Education officer, museum.   Past Medical History:  Diagnosis Date  . Abnormal Pap smear of cervix 04/06/2015   -s/p colpo 03/2015 with gyn with repeat pap with HPV advised in 1 year   . Acne   . Allergy    SEASONAL  . Anemia    menorrhagia  . Anxiety   . Asthma    mild intermittent  . Bronchitis    states gets once a year  . Depression   . Fracture of fourth metatarsal bone of left foot 04/01/2015  . Gallstones    on [place]  . Genital warts   . GERD (gastroesophageal reflux disease)   . Glucose intolerance  (impaired glucose tolerance)   . History of echocardiogram    a. Echo 2/17: Mild focal basal septal hypertrophy, EF 55-60%, normal wall motion, grade 1 diastolic dysfunction, trace MR, trace TR  . History of gestational diabetes   . Hyperlipemia   . Irritable bowel syndrome   . Nephrolithiasis 9/32/3557   Renal colic of February 3220 -per prior PCP notes  . Neuroma    in her left foot, has numbness in her toes (on gabapentin)  . 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 Injected 08/24/2015     Past Surgical History:  Procedure Laterality Date  . CESAREAN SECTION    . cryosurgery cervix    . ENDOMETRIAL ABLATION    . GYNECOLOGIC CRYOSURGERY    . LAPROSCOPIC     2012 - bowel  . TUBAL LIGATION    C/S x 2. After her ablation, she got sick, had a laparoscopy bowel was attached to her uterus. No bowel was removed. No bowel injury. She did have adhesions of her uterus to her abdominal pain.   Current Outpatient Medications  Medication Sig Dispense Refill  . B Complex Vitamins (VITAMIN B COMPLEX PO) Take by mouth.    . Biotin 300 MCG TABS Take 5,000 mcg  by mouth daily.     . cetirizine (ZYRTEC) 10 MG tablet Take 10 mg by mouth daily.    . Cholecalciferol (VITAMIN D3 PO) Take 5,000 Units by mouth.    . clindamycin (CLEOCIN T) 1 % external solution Apply 1 application topically daily.     . clobetasol ointment (TEMOVATE) 0.05 % Apply to affected areas on scalp daily Monday-Friday. Not to face, skin folds.    . cyclobenzaprine (FLEXERIL) 5 MG tablet Take 1 tablet (5 mg total) by mouth at bedtime as needed for muscle spasms. 30 tablet 1  . FERROUS SULFATE PO Take by mouth 3 (three) times a week.    . fluticasone (FLONASE) 50 MCG/ACT nasal spray use 2 sprays into each nostril once daily 16 g 6  . folic acid (FOLVITE) 1 MG tablet Take 1 mg daily by mouth.   3  . gabapentin (NEURONTIN) 100 MG capsule Take 100 mg by mouth daily.    .  hydrochlorothiazide (MICROZIDE) 12.5 MG capsule Take one cap by mouth every Monday and Thursday 30 capsule 3  . methotrexate (RHEUMATREX) 2.5 MG tablet Take 7.5 mg by mouth 3 (three) times a week.     . Multiple Vitamin (MULTIVITAMIN WITH MINERALS) TABS Take 1 tablet by mouth daily.    . potassium chloride (K-DUR) 10 MEQ tablet Take 1 tablet (10 mEq total) by mouth daily. 30 tablet 3  . propranolol (INDERAL) 10 MG tablet TAKE 1 TABLET BY MOUTH 4 TIMES DAILY AS NEEDED 60 tablet 0  . rosuvastatin (CRESTOR) 20 MG tablet Take 1 tablet (20 mg total) by mouth daily. 90 tablet 1  . sertraline (ZOLOFT) 100 MG tablet Take 2 tablets (200 mg total) by mouth daily. 180 tablet 1  . vitamin B-12 (CYANOCOBALAMIN) 1000 MCG tablet Take 1,000 mcg by mouth daily.     No current facility-administered medications for this visit.     Family History  Problem Relation Age of Onset  . Hypertension Mother   . Hypertension Father   . Hyperlipidemia Father   . Cancer Maternal Aunt   . Heart attack Neg Hx   . Heart failure Neg Hx     Review of Systems  Constitutional: Negative.   HENT: Negative.   Eyes: Negative.   Respiratory: Negative.   Cardiovascular: Negative.   Gastrointestinal: Positive for constipation.       Bloating   Endocrine: Negative.   Genitourinary: Negative.        Dysmenorrhea   Musculoskeletal: Negative.   Skin: Negative.   Allergic/Immunologic: Negative.   Neurological: Negative.   Psychiatric/Behavioral: Negative.     Exam:   BP 108/Alexis (BP Location: Right Arm, Patient Position: Sitting, Cuff Size: Normal)   Pulse 84   Resp 16   Ht 5\' 2"  (1.575 m)   Wt 164 lb (74.4 kg)   LMP 05/07/2017   BMI 30.00 kg/m   Weight change: @WEIGHTCHANGE @ Height:   Height: 5\' 2"  (157.5 cm)  Ht Readings from Last 3 Encounters:  05/17/17 5\' 2"  (1.575 m)  02/20/17 5\' 2"  (1.575 m)  10/19/16 5\' 2"  (1.575 m)    General appearance: alert, cooperative and appears stated age Head: Normocephalic,  without obvious abnormality, atraumatic Neck: no adenopathy, supple, symmetrical, trachea midline and thyroid normal to inspection and palpation Lungs: clear to auscultation bilaterally Cardiovascular: regular rate and rhythm Breasts: normal appearance, no masses or tenderness Abdomen: soft, non-tender; non distended,  no masses,  no organomegaly Extremities: extremities normal, atraumatic, no cyanosis or edema  Skin: Skin color, texture, turgor normal. No rashes or lesions Lymph nodes: Cervical, supraclavicular, and axillary nodes normal. No abnormal inguinal nodes palpated Neurologic: Grossly normal   Pelvic: External genitalia:  no lesions              Urethra:  normal appearing urethra with no masses, tenderness or lesions              Bartholins and Skenes: normal                 Vagina: normal appearing vagina with normal color and discharge, no lesions              Cervix: no lesions               Bimanual Exam:  Uterus:  normal size, contour, position, consistency, mobility, non-tender              Adnexa: no mass, fullness, tenderness               Rectovaginal: Confirms               Anus:  normal sphincter tone, no lesions  Bladder: slightly tender  Chaperone was present for exam.  A:  Well Woman with normal exam  H/O +HPV  H/O +stool guiac last year. Normal colonoscopy in 2/16  P:   Pap with hpv  IFOB given  Labs with primary MD  Mammogram overdue, # given

## 2017-05-18 LAB — CYTOLOGY - PAP
CHLAMYDIA, DNA PROBE: NEGATIVE
DIAGNOSIS: NEGATIVE
HPV (WINDOPATH): NOT DETECTED
NEISSERIA GONORRHEA: NEGATIVE

## 2017-05-18 LAB — HEP, RPR, HIV PANEL
HIV Screen 4th Generation wRfx: NONREACTIVE
Hepatitis B Surface Ag: NEGATIVE
RPR: NONREACTIVE

## 2017-05-18 LAB — HEPATITIS C ANTIBODY: Hep C Virus Ab: 0.3 s/co ratio (ref 0.0–0.9)

## 2017-06-21 ENCOUNTER — Ambulatory Visit: Payer: 59 | Admitting: Family Medicine

## 2017-07-05 ENCOUNTER — Ambulatory Visit: Payer: 59 | Admitting: Family Medicine

## 2017-07-21 NOTE — Progress Notes (Deleted)
HPI:  Using dictation device. Unfortunately this device frequently misinterprets words/phrases.  Alexis [name] is a pleasant 47 y.o. here for follow up. Chronic medical problems summarized below were reviewed for changes and stability and were updated as needed below. These issues and their treatment remain stable for the most part. ***. Denies CP, SOB, DOE, treatment intolerance or new symptoms. Due for BP labs.  HTN/Palpitations: -saw cardiologist in the past -meds: hctz, propranolol  HLD/Obesity/Prediabetes: -meds: crestor  Depression/Anxiety: -meds: zoloft 200mg  daily -Seeing counselor -Denies suicidal ideation or thoughts of self-harm  Anemia/B12 def: -heavy periods, s/p GI eval in the past - Dr. Ardis Hughs -advised iron and b12 -occ paresthesias feet - uses gabapentin rarely  GERD/IBS: -no meds  Seasonal Allergies: -meds: zyrtec, flonase  Hx abnormal pap: -sees gyn  Alopecia: -sees Dr .Kemper Durie, [place] -methotrexate   ROS: See pertinent positives and negatives per HPI.  Past Medical History:  Diagnosis Date  . Abnormal Pap smear of cervix 04/06/2015   -s/p colpo 03/2015 with gyn with repeat pap with HPV advised in 1 year   . Acne   . Allergy    SEASONAL  . Anemia    menorrhagia  . Anxiety   . Asthma    mild intermittent  . Bronchitis    states gets once a year  . Depression   . Fracture of fourth metatarsal bone of left foot 04/01/2015  . Gallstones    on [place]  . Genital warts   . GERD (gastroesophageal reflux disease)   . Glucose intolerance (impaired glucose tolerance)   . History of echocardiogram    a. Echo 2/17: Mild focal basal septal hypertrophy, EF 55-60%, normal wall motion, grade 1 diastolic dysfunction, trace MR, trace TR  . History of gestational diabetes   . Hyperlipemia   . Irritable bowel syndrome   . Nephrolithiasis 0/27/7412   Renal colic of February 8786 -per prior PCP notes  . Neuroma    in her left foot, has  numbness in her toes (on gabapentin)  . 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 Injected 08/24/2015     Past Surgical History:  Procedure Laterality Date  . CESAREAN SECTION    . cryosurgery cervix    . ENDOMETRIAL ABLATION    . GYNECOLOGIC CRYOSURGERY    . LAPROSCOPIC     After her ablation, she got sick, had a laparoscopy bowel was attached to her uterus. No bowel was removed. No bowel injury. She did have adhesions of her uterus to her abdominal pain.   . TUBAL LIGATION      Family History  Problem Relation Age of Onset  . Hypertension Mother   . Hypertension Father   . Hyperlipidemia Father   . Cancer Maternal Aunt   . Heart attack Neg Hx   . Heart failure Neg Hx     SOCIAL HX: ***   Current Outpatient Medications:  .  B Complex Vitamins (VITAMIN B COMPLEX PO), Take by mouth., Disp: , Rfl:  .  Biotin 300 MCG TABS, Take 5,000 mcg by mouth daily. , Disp: , Rfl:  .  cetirizine (ZYRTEC) 10 MG tablet, Take 10 mg by mouth daily., Disp: , Rfl:  .  Cholecalciferol (VITAMIN D3 PO), Take 5,000 Units by mouth., Disp: , Rfl:  .  clindamycin (CLEOCIN T) 1 % external solution, Apply 1 application topically daily. , Disp: , Rfl:  .  clobetasol ointment (TEMOVATE) 0.05 %, Apply to affected  areas on scalp daily Monday-Friday. Not to face, skin folds., Disp: , Rfl:  .  cyclobenzaprine (FLEXERIL) 5 MG tablet, Take 1 tablet (5 mg total) by mouth at bedtime as needed for muscle spasms., Disp: 30 tablet, Rfl: 1 .  FERROUS SULFATE PO, Take by mouth 3 (three) times a week., Disp: , Rfl:  .  fluticasone (FLONASE) 50 MCG/ACT nasal spray, use 2 sprays into each nostril once daily, Disp: 16 g, Rfl: 6 .  folic acid (FOLVITE) 1 MG tablet, Take 1 mg daily by mouth. , Disp: , Rfl: 3 .  gabapentin (NEURONTIN) 100 MG capsule, Take 100 mg by mouth daily., Disp: , Rfl:  .  hydrochlorothiazide (MICROZIDE) 12.5 MG capsule, Take one cap by mouth every  Monday and Thursday, Disp: 30 capsule, Rfl: 3 .  ibuprofen (ADVIL,MOTRIN) 800 MG tablet, Take 1 tablet (800 mg total) by mouth every 8 (eight) hours as needed., Disp: 30 tablet, Rfl: 1 .  methotrexate (RHEUMATREX) 2.5 MG tablet, Take 7.5 mg by mouth 3 (three) times a week. , Disp: , Rfl:  .  Multiple Vitamin (MULTIVITAMIN WITH MINERALS) TABS, Take 1 tablet by mouth daily., Disp: , Rfl:  .  potassium chloride (K-DUR) 10 MEQ tablet, Take 1 tablet (10 mEq total) by mouth daily., Disp: 30 tablet, Rfl: 3 .  propranolol (INDERAL) 10 MG tablet, TAKE 1 TABLET BY MOUTH 4 TIMES DAILY AS NEEDED, Disp: 60 tablet, Rfl: 0 .  rosuvastatin (CRESTOR) 20 MG tablet, Take 1 tablet (20 mg total) by mouth daily., Disp: 90 tablet, Rfl: 1 .  sertraline (ZOLOFT) 100 MG tablet, Take 2 tablets (200 mg total) by mouth daily., Disp: 180 tablet, Rfl: 1 .  vitamin B-12 (CYANOCOBALAMIN) 1000 MCG tablet, Take 1,000 mcg by mouth daily., Disp: , Rfl:   EXAM:  There were no vitals filed for this visit.  There is no height or weight on file to calculate BMI.  GENERAL: vitals reviewed and listed above, alert, oriented, appears well hydrated and in no acute distress  HEENT: atraumatic, conjunttiva clear, no obvious abnormalities on inspection of external nose and ears  NECK: no obvious masses on inspection  LUNGS: clear to auscultation bilaterally, no wheezes, rales or rhonchi, good air movement  CV: HRRR, no peripheral edema  MS: moves all extremities without noticeable abnormality *** PSYCH: pleasant and cooperative, no obvious depression or anxiety  ASSESSMENT AND PLAN:  Discussed the following assessment and plan:  No diagnosis found.  *** -Patient advised to return or notify a doctor immediately if symptoms worsen or persist or new concerns arise.  There are no Patient Instructions on file for this visit.  Lucretia Kern, DO

## 2017-07-24 ENCOUNTER — Ambulatory Visit: Payer: 59 | Admitting: Family Medicine

## 2017-07-30 NOTE — Progress Notes (Signed)
HPI:  Using dictation device. Unfortunately this device frequently misinterprets words/phrases.  Alexis [name] is a pleasant 47 y.o. here for follow up. Chronic medical problems summarized below were reviewed for changes.  Reports she is doing well.  She is still on the methotrexate for her hair and is happy that her hair is returning.  Her dermatologist requested that she get a CMP and CBC today given she is on this medication.   She reports she has been eating healthy, though her schedule has not allowed much time for exercise.  She works 2 jobs.  D acid reflux issues, infections, denies CP, worsening mood, SOB, DOE, treatment intolerance or new symptoms.  See PHQ 9.  She requests refills on the Crestor and the Zoloft to her mail-in pharmacy. Due for labs.  HTN/Palpitations: -saw cardiologist in the past -meds: hctz, propranolol  HLD/Obesity/Prediabetes: -meds: crestor  Depression/Anxiety: -meds: zoloft 200mg  daily -Seeing counselor  Alopecia: -sees dermatologist at [facility], Dr. Kemper Durie -on methotrexate for this  Anemia/B12 def: -heavy periods, s/p GI eval in the past - Dr. Ardis Hughs, sees gyn -advised iron and b12 -occ paresthesias feet - uses gabapentin rarely  GERD/IBS: -no meds  Seasonal Allergies: -meds: zyrtec, flonase  Hx abnormal pap: -sees gyn   ROS: See pertinent positives and negatives per HPI.  Past Medical History:  Diagnosis Date  . Abnormal Pap smear of cervix 04/06/2015   -s/p colpo 03/2015 with gyn with repeat pap with HPV advised in 1 year   . Acne   . Allergy    SEASONAL  . Anemia    menorrhagia  . Anxiety   . Asthma    mild intermittent  . Bronchitis    states gets once a year  . Depression   . Fracture of fourth metatarsal bone of left foot 04/01/2015  . Gallstones    on [place]  . Genital warts   . GERD (gastroesophageal reflux disease)   . Glucose intolerance (impaired glucose tolerance)   . History of echocardiogram    a.  Echo 2/17: Mild focal basal septal hypertrophy, EF 55-60%, normal wall motion, grade 1 diastolic dysfunction, trace MR, trace TR  . History of gestational diabetes   . Hyperlipemia   . Irritable bowel syndrome   . Nephrolithiasis 07/26/9561   Renal colic of February 8756 -per prior PCP notes  . Neuroma    in her left foot, has numbness in her toes (on gabapentin)  . 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 Injected 08/24/2015     Past Surgical History:  Procedure Laterality Date  . CESAREAN SECTION    . cryosurgery cervix    . ENDOMETRIAL ABLATION    . GYNECOLOGIC CRYOSURGERY    . LAPROSCOPIC     After her ablation, she got sick, had a laparoscopy bowel was attached to her uterus. No bowel was removed. No bowel injury. She did have adhesions of her uterus to her abdominal pain.   . TUBAL LIGATION      Family History  Problem Relation Age of Onset  . Hypertension Mother   . Hypertension Father   . Hyperlipidemia Father   . Cancer Maternal Aunt   . Heart attack Neg Hx   . Heart failure Neg Hx     SOCIAL HX: see above   Current Outpatient Medications:  .  B Complex Vitamins (VITAMIN B COMPLEX PO), Take by mouth., Disp: , Rfl:  .  Biotin 300 MCG TABS,  Take 5,000 mcg by mouth daily. , Disp: , Rfl:  .  cetirizine (ZYRTEC) 10 MG tablet, Take 10 mg by mouth daily., Disp: , Rfl:  .  Cholecalciferol (VITAMIN D3 PO), Take 5,000 Units by mouth., Disp: , Rfl:  .  clindamycin (CLEOCIN T) 1 % external solution, Apply 1 application topically daily. , Disp: , Rfl:  .  clobetasol ointment (TEMOVATE) 0.05 %, Apply to affected areas on scalp daily Monday-Friday. Not to face, skin folds., Disp: , Rfl:  .  cyclobenzaprine (FLEXERIL) 5 MG tablet, Take 1 tablet (5 mg total) by mouth at bedtime as needed for muscle spasms., Disp: 30 tablet, Rfl: 1 .  FERROUS SULFATE PO, Take by mouth 3 (three) times a week., Disp: , Rfl:  .  fluticasone  (FLONASE) 50 MCG/ACT nasal spray, use 2 sprays into each nostril once daily, Disp: 16 g, Rfl: 6 .  folic acid (FOLVITE) 1 MG tablet, Take 1 mg daily by mouth. , Disp: , Rfl: 3 .  gabapentin (NEURONTIN) 100 MG capsule, Take 100 mg by mouth daily., Disp: , Rfl:  .  hydrochlorothiazide (MICROZIDE) 12.5 MG capsule, Take one cap by mouth every Monday and Thursday, Disp: 30 capsule, Rfl: 3 .  ibuprofen (ADVIL,MOTRIN) 800 MG tablet, Take 1 tablet (800 mg total) by mouth every 8 (eight) hours as needed., Disp: 30 tablet, Rfl: 1 .  methotrexate (RHEUMATREX) 2.5 MG tablet, Take 7.5 mg by mouth 3 (three) times a week. , Disp: , Rfl:  .  Multiple Vitamin (MULTIVITAMIN WITH MINERALS) TABS, Take 1 tablet by mouth daily., Disp: , Rfl:  .  potassium chloride (K-DUR) 10 MEQ tablet, Take 1 tablet (10 mEq total) by mouth daily., Disp: 30 tablet, Rfl: 3 .  propranolol (INDERAL) 10 MG tablet, TAKE 1 TABLET BY MOUTH 4 TIMES DAILY AS NEEDED, Disp: 60 tablet, Rfl: 0 .  rosuvastatin (CRESTOR) 20 MG tablet, Take 1 tablet (20 mg total) by mouth daily., Disp: 90 tablet, Rfl: 1 .  sertraline (ZOLOFT) 100 MG tablet, Take 2 tablets (200 mg total) by mouth daily., Disp: 180 tablet, Rfl: 1 .  vitamin B-12 (CYANOCOBALAMIN) 1000 MCG tablet, Take 1,000 mcg by mouth daily., Disp: , Rfl:   EXAM:  Vitals:   07/31/17 0813  BP: 98/64  Pulse: 91  Temp: 98.2 F (36.8 C)    Body mass index is 29.98 kg/m.  GENERAL: vitals reviewed and listed above, alert, oriented, appears well hydrated and in no acute distress  HEENT: atraumatic, conjunttiva clear, no obvious abnormalities on inspection of external nose and ears  NECK: no obvious masses on inspection  LUNGS: clear to auscultation bilaterally, no wheezes, rales or rhonchi, good air movement  CV: HRRR, no peripheral edema  MS: moves all extremities without noticeable abnormality  PSYCH: pleasant and cooperative, no obvious depression or anxiety  ASSESSMENT AND  PLAN:  Discussed the following assessment and plan:  Essential hypertension - Plan: CBC with Differential/Platelet, Comprehensive metabolic panel  Pure hypercholesterolemia  Recurrent major depressive disorder, in full remission (HCC)  B12 deficiency  Hyperglycemia  Alopecia - Plan: CBC with Differential/Platelet, Comprehensive metabolic panel  -Labs per orders, she reports her  dermatologist can see the labs in epic -Congratulated her on a healthy diet, encouraged to add some exercise -Refills sent -Follow-up 3 to 4 months   Patient Instructions  BEFORE YOU LEAVE: -labs -follow up: 4 months  We have ordered labs or studies at this visit. It can take up to 1-2 weeks for results  and processing. IF results require follow up or explanation, we will call you with instructions. Clinically stable results will be released to your [facility]. If you have not heard from [place] or cannot find your results in [facility] in 2 weeks please contact our office at [phone].  If you are not yet signed up for [facility] please consider signing up.   We recommend the following healthy lifestyle for LIFE: 1) Small portions. But, make sure to get regular (at least 3 per day), healthy meals and small healthy snacks if needed.  2) Eat a healthy clean diet.   TRY TO EAT: -at least 5-7 servings of low sugar, colorful, and nutrient rich vegetables per day (not corn, potatoes or bananas.) -berries are the best choice if you wish to eat fruit (only eat small amounts if trying to reduce weight)  -lean meets (fish, white meat of chicken or [place]) -vegan proteins for some meals - beans or tofu, whole grains, nuts and seeds -Replace bad fats with good fats - good fats include: fish, nuts and seeds, canola oil, olive oil -small amounts of low fat or non fat dairy -small amounts of100 % whole grains - check the lables -drink plenty of water  AVOID: -SUGAR, sweets, anything with added sugar, corn syrup or  sweeteners - must read labels as even foods advertised as "healthy" often are loaded with sugar -if you must have a sweetener, small amounts of stevia may be best -sweetened beverages and artificially sweetened beverages -simple starches (rice, bread, potatoes, pasta, chips, etc - small amounts of 100% whole grains are ok) -red meat, pork, butter -fried foods, fast food, processed food, excessive dairy, eggs and coconut.  3)Get at least 150 minutes of sweaty aerobic exercise per week.  4)Reduce stress - consider counseling, meditation and relaxation to balance other aspects of your life.          Lucretia Kern, DO

## 2017-07-31 ENCOUNTER — Ambulatory Visit: Payer: 59 | Admitting: Family Medicine

## 2017-07-31 ENCOUNTER — Encounter: Payer: Self-pay | Admitting: Family Medicine

## 2017-07-31 VITALS — BP 98/64 | HR 91 | Temp 98.2°F | Ht 62.0 in | Wt 163.9 lb

## 2017-07-31 DIAGNOSIS — E7800 Pure hypercholesterolemia, unspecified: Secondary | ICD-10-CM | POA: Diagnosis not present

## 2017-07-31 DIAGNOSIS — I10 Essential (primary) hypertension: Secondary | ICD-10-CM

## 2017-07-31 DIAGNOSIS — R739 Hyperglycemia, unspecified: Secondary | ICD-10-CM

## 2017-07-31 DIAGNOSIS — F3342 Major depressive disorder, recurrent, in full remission: Secondary | ICD-10-CM

## 2017-07-31 DIAGNOSIS — L659 Nonscarring hair loss, unspecified: Secondary | ICD-10-CM | POA: Diagnosis not present

## 2017-07-31 DIAGNOSIS — E538 Deficiency of other specified B group vitamins: Secondary | ICD-10-CM | POA: Diagnosis not present

## 2017-07-31 LAB — CBC WITH DIFFERENTIAL/PLATELET
BASOS PCT: 0.5 % (ref 0.0–3.0)
Basophils Absolute: 0 10*3/uL (ref 0.0–0.1)
EOS ABS: 0.1 10*3/uL (ref 0.0–0.7)
Eosinophils Relative: 2 % (ref 0.0–5.0)
HEMATOCRIT: 36 % (ref 36.0–46.0)
Hemoglobin: 11.8 g/dL — ABNORMAL LOW (ref 12.0–15.0)
LYMPHS PCT: 24.8 % (ref 12.0–46.0)
Lymphs Abs: 1.3 10*3/uL (ref 0.7–4.0)
MCHC: 32.8 g/dL (ref 30.0–36.0)
MCV: 84.9 fl (ref 78.0–100.0)
MONO ABS: 0.3 10*3/uL (ref 0.1–1.0)
Monocytes Relative: 4.8 % (ref 3.0–12.0)
NEUTROS ABS: 3.6 10*3/uL (ref 1.4–7.7)
Neutrophils Relative %: 67.9 % (ref 43.0–77.0)
PLATELETS: 280 10*3/uL (ref 150.0–400.0)
RBC: 4.24 Mil/uL (ref 3.87–5.11)
RDW: 15.3 % (ref 11.5–15.5)
WBC: 5.3 10*3/uL (ref 4.0–10.5)

## 2017-07-31 LAB — COMPREHENSIVE METABOLIC PANEL
ALT: 17 U/L (ref 0–35)
AST: 13 U/L (ref 0–37)
Albumin: 4.2 g/dL (ref 3.5–5.2)
Alkaline Phosphatase: 48 U/L (ref 39–117)
BUN: 12 mg/dL (ref 6–23)
CALCIUM: 9.1 mg/dL (ref 8.4–10.5)
CHLORIDE: 103 meq/L (ref 96–112)
CO2: 30 meq/L (ref 19–32)
CREATININE: 0.76 mg/dL (ref 0.40–1.20)
GFR: 105.03 mL/min (ref >60.00)
Glucose, Bld: 107 mg/dL — ABNORMAL HIGH (ref 70–99)
Potassium: 4 mEq/L (ref 3.5–5.1)
Sodium: 141 mEq/L (ref 135–145)
Total Bilirubin: 0.4 mg/dL (ref 0.2–1.2)
Total Protein: 6.7 g/dL (ref 6.0–8.3)

## 2017-07-31 MED ORDER — SERTRALINE HCL 100 MG PO TABS: 200.0000 mg | ORAL_TABLET | Freq: Every day | ORAL | 1 refills | Status: DC

## 2017-07-31 MED ORDER — ROSUVASTATIN CALCIUM 20 MG PO TABS: 20.0000 mg | ORAL_TABLET | Freq: Every day | ORAL | 1 refills | Status: DC

## 2017-07-31 NOTE — Patient Instructions (Signed)
BEFORE YOU LEAVE: -labs -follow up: 4 months  We have ordered labs or studies at this visit. It can take up to 1-2 weeks for results and processing. IF results require follow up or explanation, we will call you with instructions. Clinically stable results will be released to your MYCHART. If you have not heard from us or cannot find your results in MY[facility]ART in 2 weeks please contact our office at [phone].  If you are not yet signed up for [facility], please consider signing up.   We recommend the following healthy lifestyle for LIFE: 1) Small portions. But, make sure to get regular (at least 3 per day), healthy meals and small healthy snacks if needed.  2) Eat a healthy clean diet.   TRY TO EAT: -at least 5-7 servings of low sugar, colorful, and nutrient rich vegetables per day (not corn, potatoes or bananas.) -berries are the best choice if you wish to eat fruit (only eat small amounts if trying to reduce weight)  -lean meets (fish, white meat of chicken or turkey) -vegan proteins for some meals - beans or tofu, whole grains, nuts and seeds -Replace bad fats with good fats - good fats include: fish, nuts and seeds, canola oil, olive oil -small amounts of low fat or non fat dairy -small amounts of100 % whole grains - check the lables -drink plenty of water  AVOID: -SUGAR, sweets, anything with added sugar, corn syrup or sweeteners - must read labels as even foods advertised as "healthy" often are loaded with sugar -if you must have a sweetener, small amounts of stevia may be best -sweetened beverages and artificially sweetened beverages -simple starches (rice, bread, potatoes, pasta, chips, etc - small amounts of 100% whole grains are ok) -red meat, pork, butter -fried foods, fast food, processed food, excessive dairy, eggs and coconut.  3)Get at least 150 minutes of sweaty aerobic exercise per week.  4)Reduce stress - consider counseling, meditation and relaxation to balance other  aspects of your life.       

## 2017-08-02 ENCOUNTER — Encounter: Payer: Self-pay | Admitting: *Deleted

## 2017-08-08 IMAGING — DX DG FOOT COMPLETE 3+V*L*
3 series · 3 of 3 positions shown · non-contrast
Comparison: None.

CLINICAL DATA: Metal fell on foot 7 days ago.  Left foot pain

EXAM:
LEFT FOOT - COMPLETE 3+ VIEW

[foot ap]
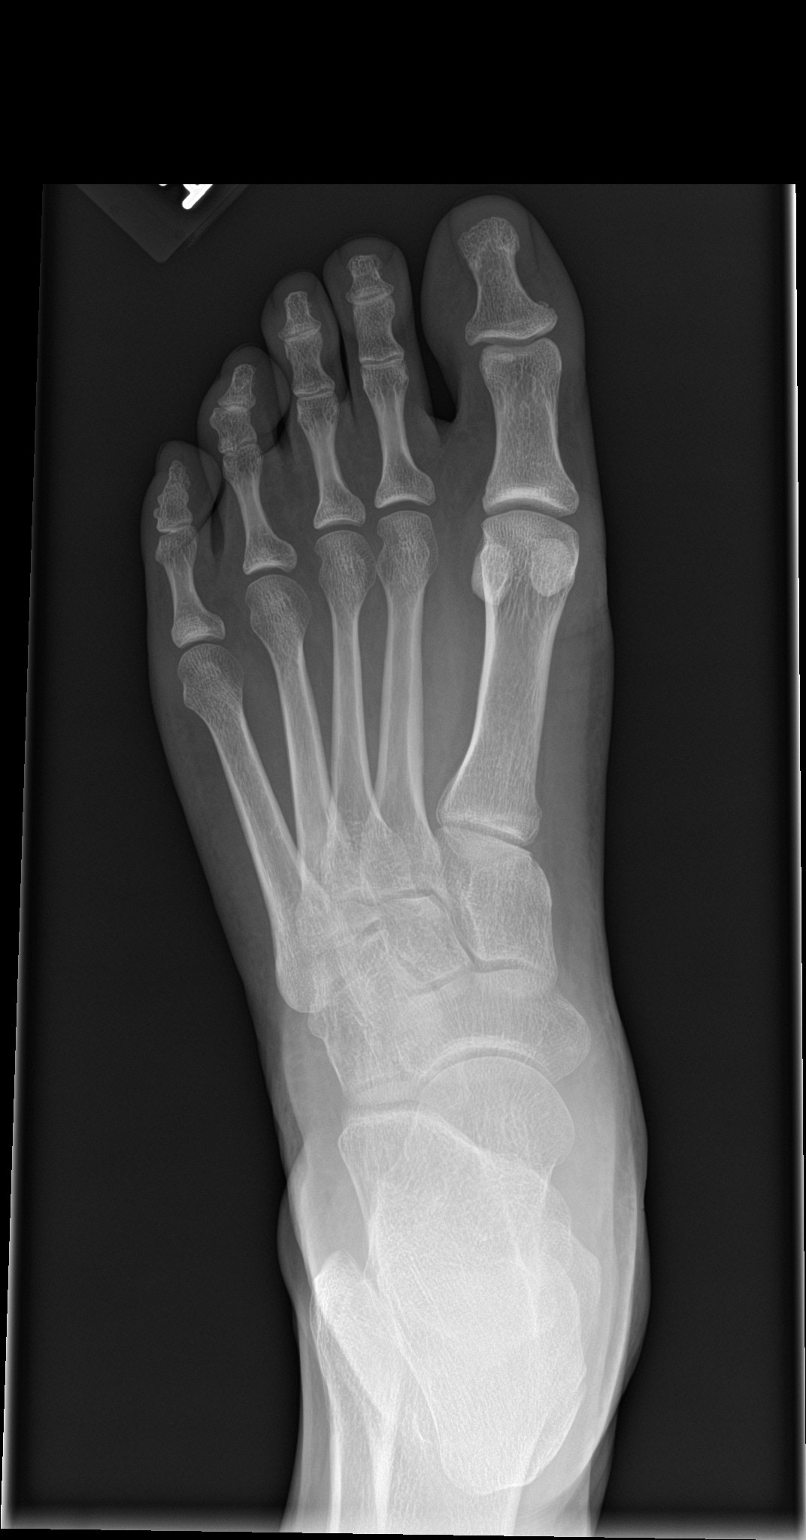

[foot obl]
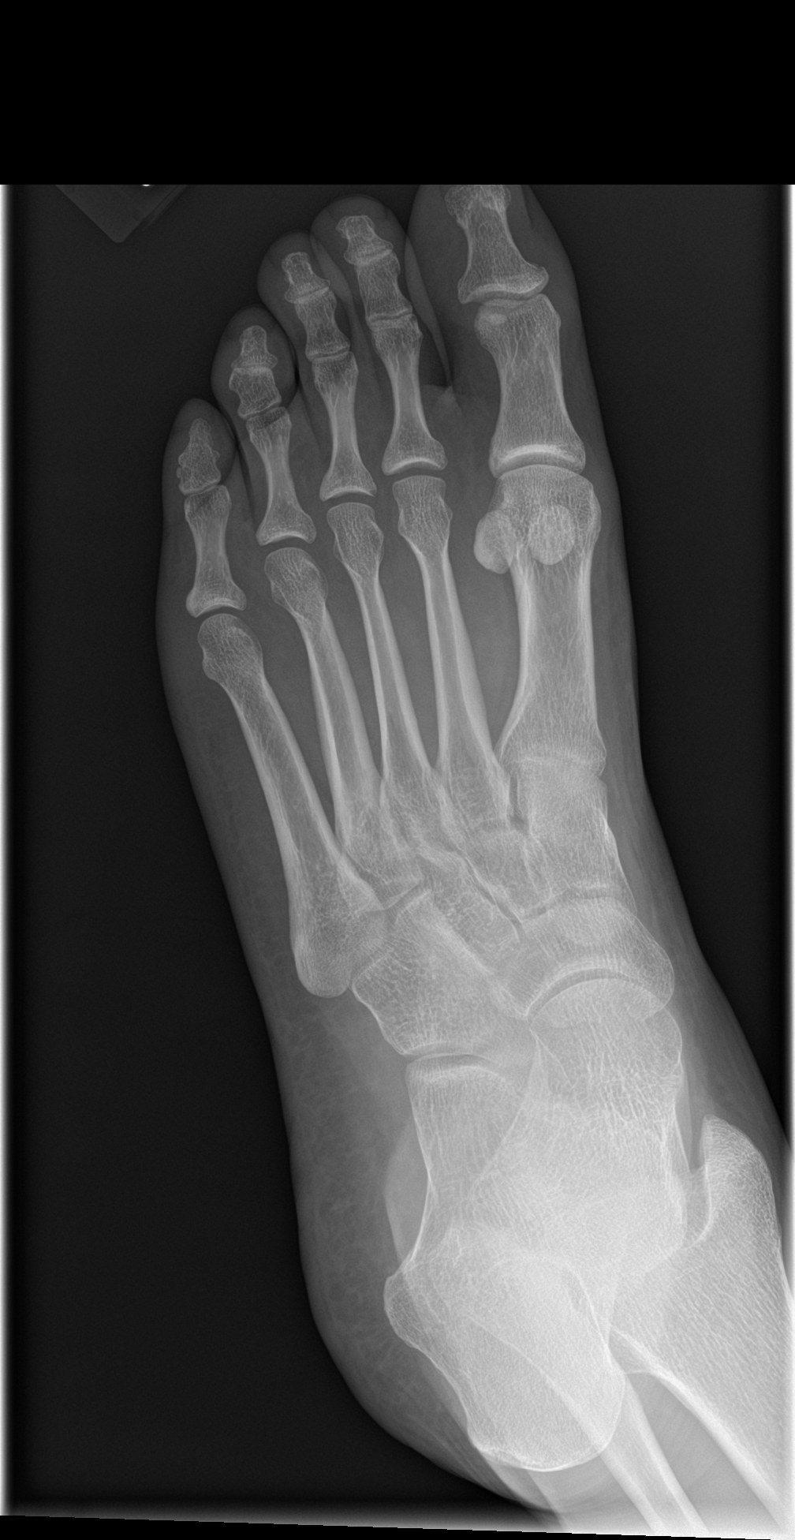

[foot lat]
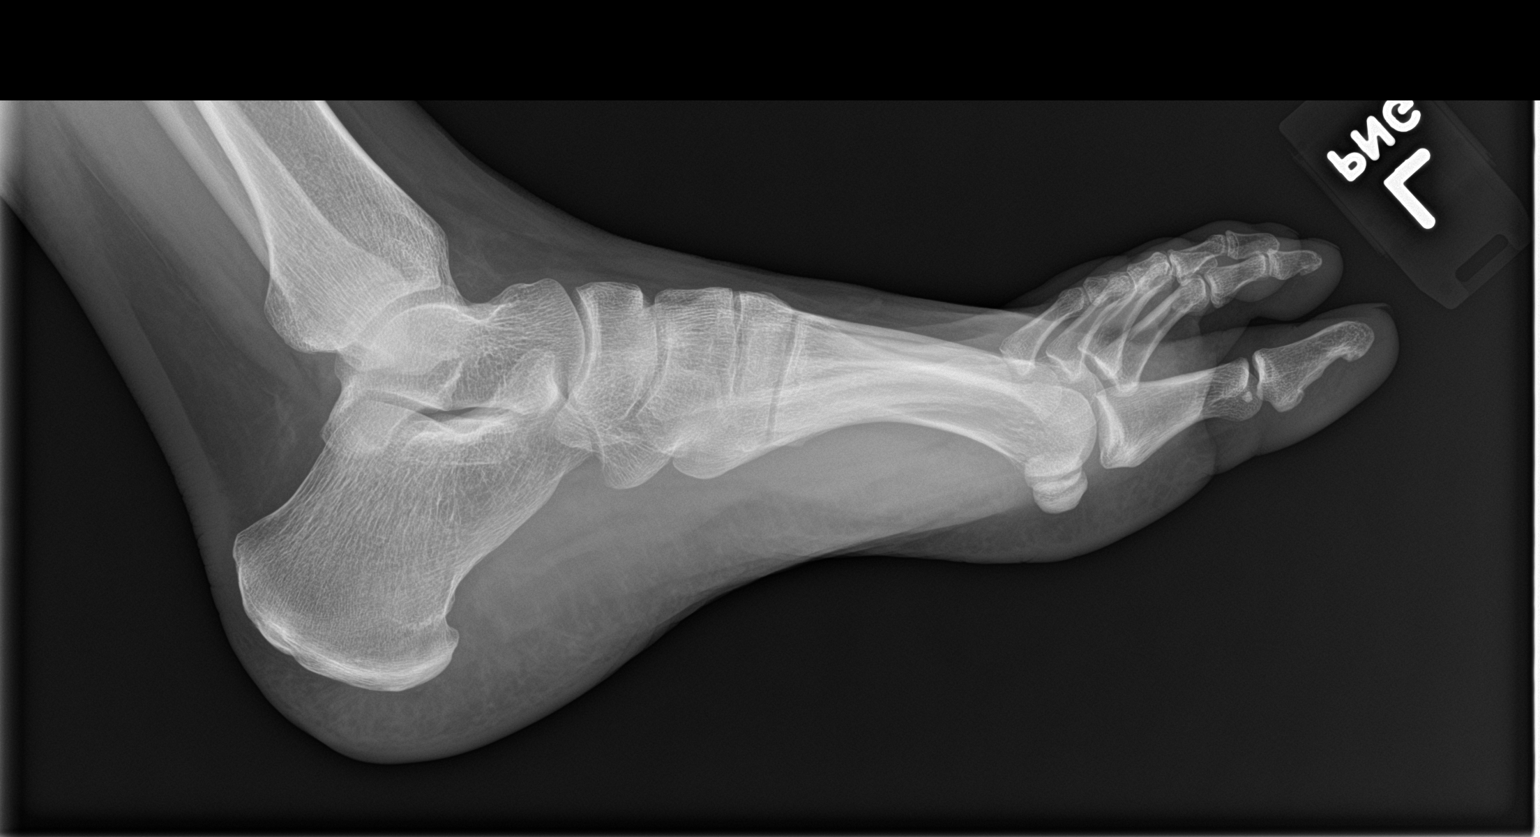

[3 of 3 positions shown; findings below may reference images not displayed]

FINDINGS: There is no evidence of fracture or dislocation. There is no
evidence of arthropathy or other focal bone abnormality. Soft
tissues are unremarkable.
IMPRESSION: Negative.

## 2017-11-01 ENCOUNTER — Other Ambulatory Visit: Payer: Self-pay | Admitting: Family Medicine

## 2017-11-29 ENCOUNTER — Ambulatory Visit: Payer: 59 | Admitting: Family Medicine

## 2017-12-17 NOTE — Progress Notes (Signed)
HPI:  Using dictation device. Unfortunately this device frequently misinterprets words/phrases.  Alexis [name] is a pleasant 47 y.o. here for follow up. Chronic medical problems summarized below were reviewed for changes and stability and were updated as needed below. These issues and their treatment remain stable for the most part.  Reports doing ok. Feels the methotrexate for her hair makes her tired, but she prefers this to hair loss. O/w doing well. Busy schedule. Does not have time for much exercise, tries to eat healthy, but sometimes does boost in place of meal. Denies CP, SOB, DOE, treatment intolerance or new symptoms. Mood ok - PHQ9 3.  Due for flu vaccine and labs.  HTN/Palpitations: -saw cardiologist in the past -meds: hctz, propranolol  HLD/Obesity/Prediabetes: -meds: crestor  Depression/Anxiety: -meds: zoloft 200mg  daily -Seeing counselor  Alopecia: -sees dermatologist at [facility], Dr. Kemper Durie -on methotrexate for this  Anemia/B12 def: -heavy periods, s/p GI eval in the past - Dr. Ardis Hughs, sees gyn -advised iron and b12 -occ paresthesias feet - uses gabapentin rarely  GERD/IBS: -no meds  Seasonal Allergies: -meds: zyrtec, flonase  Hx abnormal pap: -sees gyn  ROS: See pertinent positives and negatives per HPI.  Past Medical History:  Diagnosis Date  . Abnormal Pap smear of cervix 04/06/2015   -s/p colpo 03/2015 with gyn with repeat pap with HPV advised in 1 year   . Acne   . Allergy    SEASONAL  . Anemia    menorrhagia  . Anxiety   . Asthma    mild intermittent  . Bronchitis    states gets once a year  . Depression   . Fracture of fourth metatarsal bone of left foot 04/01/2015  . Gallstones    on [place]  . Genital warts   . GERD (gastroesophageal reflux disease)   . Glucose intolerance (impaired glucose tolerance)   . History of echocardiogram    a. Echo 2/17: Mild focal basal septal hypertrophy, EF 55-60%, normal wall motion, grade 1  diastolic dysfunction, trace MR, trace TR  . History of gestational diabetes   . Hyperlipemia   . Irritable bowel syndrome   . Nephrolithiasis 1/54/0086   Renal colic of February 7619 -per prior PCP notes  . Neuroma    in her left foot, has numbness in her toes (on gabapentin)  . 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 Injected 08/24/2015     Past Surgical History:  Procedure Laterality Date  . CESAREAN SECTION    . cryosurgery cervix    . ENDOMETRIAL ABLATION    . GYNECOLOGIC CRYOSURGERY    . LAPROSCOPIC     After her ablation, she got sick, had a laparoscopy bowel was attached to her uterus. No bowel was removed. No bowel injury. She did have adhesions of her uterus to her abdominal pain.   . TUBAL LIGATION      Family History  Problem Relation Age of Onset  . Hypertension Mother   . Hypertension Father   . Hyperlipidemia Father   . Cancer Maternal Aunt   . Heart attack Neg Hx   . Heart failure Neg Hx     SOCIAL HX: see hpi   Current Outpatient Medications:  .  B Complex Vitamins (VITAMIN B COMPLEX PO), Take by mouth., Disp: , Rfl:  .  Biotin 300 MCG TABS, Take 5,000 mcg by mouth daily. , Disp: , Rfl:  .  cetirizine (ZYRTEC) 10 MG tablet, Take 10 mg  by mouth daily., Disp: , Rfl:  .  Cholecalciferol (VITAMIN D3 PO), Take 5,000 Units by mouth., Disp: , Rfl:  .  clindamycin (CLEOCIN T) 1 % external solution, Apply 1 application topically daily. , Disp: , Rfl:  .  clobetasol ointment (TEMOVATE) 0.05 %, Apply to affected areas on scalp daily Monday-Friday. Not to face, skin folds., Disp: , Rfl:  .  cyclobenzaprine (FLEXERIL) 5 MG tablet, Take 1 tablet (5 mg total) by mouth at bedtime as needed for muscle spasms., Disp: 30 tablet, Rfl: 1 .  FERROUS SULFATE PO, Take by mouth 3 (three) times a week., Disp: , Rfl:  .  fluticasone (FLONASE) 50 MCG/ACT nasal spray, use 2 sprays into each nostril once daily, Disp: 16 g, Rfl:  6 .  folic acid (FOLVITE) 1 MG tablet, Take 1 mg daily by mouth. , Disp: , Rfl: 3 .  gabapentin (NEURONTIN) 100 MG capsule, Take 100 mg by mouth daily., Disp: , Rfl:  .  hydrochlorothiazide (MICROZIDE) 12.5 MG capsule, Take one cap by mouth every Monday and Thursday, Disp: 30 capsule, Rfl: 3 .  ibuprofen (ADVIL,MOTRIN) 800 MG tablet, Take 1 tablet (800 mg total) by mouth every 8 (eight) hours as needed., Disp: 30 tablet, Rfl: 1 .  methotrexate (RHEUMATREX) 2.5 MG tablet, Take 7.5 mg by mouth 3 (three) times a week. , Disp: , Rfl:  .  Multiple Vitamin (MULTIVITAMIN WITH MINERALS) TABS, Take 1 tablet by mouth daily., Disp: , Rfl:  .  potassium chloride (K-DUR) 10 MEQ tablet, Take 1 tablet (10 mEq total) by mouth daily., Disp: 30 tablet, Rfl: 3 .  propranolol (INDERAL) 10 MG tablet, TAKE 1 TABLET BY MOUTH 4 TIMES DAILY AS NEEDED, Disp: 60 tablet, Rfl: 0 .  rosuvastatin (CRESTOR) 20 MG tablet, Take 1 tablet (20 mg total) by mouth daily., Disp: 90 tablet, Rfl: 1 .  rosuvastatin (CRESTOR) 20 MG tablet, TAKE 1 TABLET DAILY, Disp: 90 tablet, Rfl: 1 .  sertraline (ZOLOFT) 100 MG tablet, Take 2 tablets (200 mg total) by mouth daily., Disp: 180 tablet, Rfl: 1 .  sertraline (ZOLOFT) 100 MG tablet, TAKE 2 TABLETS (=200MG )    DAILY, Disp: 180 tablet, Rfl: 1 .  vitamin B-12 (CYANOCOBALAMIN) 1000 MCG tablet, Take 1,000 mcg by mouth daily., Disp: , Rfl:   EXAM:  Vitals:   12/18/17 0740  BP: 98/60  Pulse: 93  Temp: 98.3 F (36.8 C)    Body mass index is 31.15 kg/m.  GENERAL: vitals reviewed and listed above, alert, oriented, appears well hydrated and in no acute distress  HEENT: atraumatic, conjunttiva clear, no obvious abnormalities on inspection of external nose and ears  NECK: no obvious masses on inspection  LUNGS: clear to auscultation bilaterally, no wheezes, rales or rhonchi, good air movement  CV: HRRR, no peripheral edema  MS: moves all extremities without noticeable  abnormality  PSYCH: pleasant and cooperative, no obvious depression or anxiety  ASSESSMENT AND PLAN:  Discussed the following assessment and plan:  Essential hypertension - Plan: Basic metabolic panel, CBC  Hyperlipidemia, unspecified hyperlipidemia type - Plan: Lipid panel  Hyperglycemia - Plan: Hemoglobin A1c  Anemia, unspecified type - Plan: Ferritin  B12 deficiency - Plan: Vitamin B12  Recurrent major depressive disorder, in partial remission ([place])  -labs per orders -encouraged healthy diet and discussed some quick options, discussed trying to increase gentle exercise and discussion the medication further with her specialist -cont zoloft and other medications -CPE in 3-4 months -she wants her flu shot today -  Patient advised to return or notify a doctor immediately if symptoms worsen or persist or new concerns arise.  Patient Instructions  BEFORE YOU LEAVE: -flu shot -labs -follow up: CPE in 3-4 months  We have ordered labs or studies at this visit. It can take up to 1-2 weeks for results and processing. IF results require follow up or explanation, we will call you with instructions. Clinically stable results will be released to your [facility]. If you have not heard from [place] or cannot find your results in [facility] in 2 weeks please contact our office at [phone].  If you are not yet signed up for [facility], please consider signing up.    We recommend the following healthy lifestyle for LIFE: 1) Small portions. But, make sure to get regular (at least 3 per day), healthy meals and small healthy snacks if needed.  2) Eat a healthy clean diet.   TRY TO EAT: -at least 5-7 servings of low sugar, colorful, and nutrient rich vegetables per day (not corn, potatoes or bananas.) -berries are the best choice if you wish to eat fruit (only eat small amounts if trying to reduce weight)  -lean meets (fish, white meat of chicken or [place]) -vegan proteins for some meals - beans or  tofu, whole grains, nuts and seeds -Replace bad fats with good fats - good fats include: fish, nuts and seeds, canola oil, olive oil -small amounts of low fat or non fat dairy -small amounts of100 % whole grains - check the lables -drink plenty of water  AVOID: -SUGAR, sweets, anything with added sugar, corn syrup or sweeteners - must read labels as even foods advertised as "healthy" often are loaded with sugar -if you must have a sweetener, small amounts of stevia may be best -sweetened beverages and artificially sweetened beverages -simple starches (rice, bread, potatoes, pasta, chips, etc - small amounts of 100% whole grains are ok) -red meat, pork, butter -fried foods, fast food, processed food, excessive dairy, eggs and coconut.  3)Get at least 150 minutes of sweaty aerobic exercise per week.  4)Reduce stress - consider counseling, meditation and relaxation to balance other aspects of your life.   WE NOW OFFER   [facility] FAST TRACK!!!  SAME DAY Appointments for ACUTE CARE  Such as: Sprains, Injuries, cuts, abrasions, rashes, muscle pain, joint pain, back pain Colds, flu, sore throats, headache, allergies, cough, fever  Ear pain, sinus and eye infections Abdominal pain, nausea, vomiting, diarrhea, upset stomach Animal/insect bites  3 Easy Ways to Schedule: Walk-In Scheduling Call in scheduling Mychart Sign-up: [email or web address]              Lucretia Kern, DO

## 2017-12-18 ENCOUNTER — Encounter: Payer: Self-pay | Admitting: Family Medicine

## 2017-12-18 ENCOUNTER — Ambulatory Visit: Payer: 59 | Admitting: Family Medicine

## 2017-12-18 VITALS — BP 98/60 | HR 93 | Temp 98.3°F | Ht 62.0 in | Wt 170.3 lb

## 2017-12-18 DIAGNOSIS — E538 Deficiency of other specified B group vitamins: Secondary | ICD-10-CM | POA: Diagnosis not present

## 2017-12-18 DIAGNOSIS — Z23 Encounter for immunization: Secondary | ICD-10-CM

## 2017-12-18 DIAGNOSIS — R739 Hyperglycemia, unspecified: Secondary | ICD-10-CM

## 2017-12-18 DIAGNOSIS — E785 Hyperlipidemia, unspecified: Secondary | ICD-10-CM | POA: Diagnosis not present

## 2017-12-18 DIAGNOSIS — D649 Anemia, unspecified: Secondary | ICD-10-CM | POA: Diagnosis not present

## 2017-12-18 DIAGNOSIS — I10 Essential (primary) hypertension: Secondary | ICD-10-CM

## 2017-12-18 DIAGNOSIS — F3341 Major depressive disorder, recurrent, in partial remission: Secondary | ICD-10-CM

## 2017-12-18 LAB — BASIC METABOLIC PANEL
BUN: 21 mg/dL (ref 6–23)
CHLORIDE: 104 meq/L (ref 96–112)
CO2: 25 mEq/L (ref 19–32)
Calcium: 9.3 mg/dL (ref 8.4–10.5)
Creatinine, Ser: 0.76 mg/dL (ref 0.40–1.20)
GFR: 104.85 mL/min (ref >60.00)
GLUCOSE: 98 mg/dL (ref 70–99)
POTASSIUM: 4.7 meq/L (ref 3.5–5.1)
SODIUM: 140 meq/L (ref 135–145)

## 2017-12-18 LAB — CBC
HEMATOCRIT: 34.9 % — AB (ref 36.0–46.0)
HEMOGLOBIN: 11.6 g/dL — AB (ref 12.0–15.0)
MCHC: 33.1 g/dL (ref 30.0–36.0)
MCV: 83.4 fl (ref 78.0–100.0)
PLATELETS: 273 10*3/uL (ref 150.0–400.0)
RBC: 4.19 Mil/uL (ref 3.87–5.11)
RDW: 15.8 % — AB (ref 11.5–15.5)
WBC: 6.3 10*3/uL (ref 4.0–10.5)

## 2017-12-18 LAB — LIPID PANEL
Cholesterol: 141 mg/dL (ref 0–200)
HDL: 39.8 mg/dL (ref >39.00)
LDL CALC: 85 mg/dL (ref 0–99)
NonHDL: 101.68
TRIGLYCERIDES: 85 mg/dL (ref 0.0–149.0)
Total CHOL/HDL Ratio: 4
VLDL: 17 mg/dL (ref 0.0–40.0)

## 2017-12-18 LAB — VITAMIN B12: Vitamin B-12: 157 pg/mL — ABNORMAL LOW (ref 211–911)

## 2017-12-18 LAB — FERRITIN: Ferritin: 68 ng/mL (ref 10.0–291.0)

## 2017-12-18 LAB — HEMOGLOBIN A1C: Hgb A1c MFr Bld: 6.2 % (ref 4.6–6.5)

## 2017-12-18 NOTE — Patient Instructions (Addendum)
BEFORE YOU LEAVE: -flu shot -labs -follow up: CPE in 3-4 months  We have ordered labs or studies at this visit. It can take up to 1-2 weeks for results and processing. IF results require follow up or explanation, we will call you with instructions. Clinically stable results will be released to your [facility]. If you have not heard from [place] or cannot find your results in [facility] in 2 weeks please contact our office at [phone].  If you are not yet signed up for [facility], please consider signing up.    We recommend the following healthy lifestyle for LIFE: 1) Small portions. But, make sure to get regular (at least 3 per day), healthy meals and small healthy snacks if needed.  2) Eat a healthy clean diet.   TRY TO EAT: -at least 5-7 servings of low sugar, colorful, and nutrient rich vegetables per day (not corn, potatoes or bananas.) -berries are the best choice if you wish to eat fruit (only eat small amounts if trying to reduce weight)  -lean meets (fish, white meat of chicken or [place]) -vegan proteins for some meals - beans or tofu, whole grains, nuts and seeds -Replace bad fats with good fats - good fats include: fish, nuts and seeds, canola oil, olive oil -small amounts of low fat or non fat dairy -small amounts of100 % whole grains - check the lables -drink plenty of water  AVOID: -SUGAR, sweets, anything with added sugar, corn syrup or sweeteners - must read labels as even foods advertised as "healthy" often are loaded with sugar -if you must have a sweetener, small amounts of stevia may be best -sweetened beverages and artificially sweetened beverages -simple starches (rice, bread, potatoes, pasta, chips, etc - small amounts of 100% whole grains are ok) -red meat, pork, butter -fried foods, fast food, processed food, excessive dairy, eggs and coconut.  3)Get at least 150 minutes of sweaty aerobic exercise per week.  4)Reduce stress - consider counseling, meditation and  relaxation to balance other aspects of your life.   WE NOW OFFER   [facility] FAST TRACK!!!  SAME DAY Appointments for ACUTE CARE  Such as: Sprains, Injuries, cuts, abrasions, rashes, muscle pain, joint pain, back pain Colds, flu, sore throats, headache, allergies, cough, fever  Ear pain, sinus and eye infections Abdominal pain, nausea, vomiting, diarrhea, upset stomach Animal/insect bites  3 Easy Ways to Schedule: Walk-In Scheduling Call in scheduling Mychart Sign-up: [email or web address]

## 2017-12-18 NOTE — Addendum Note (Signed)
Addended by: Agnes Lawrence on: 12/18/2017 08:21 AM   Modules accepted: Orders

## 2017-12-21 NOTE — Addendum Note (Signed)
Addended by: Agnes Lawrence on: 12/21/2017 03:50 PM   Modules accepted: Orders

## 2017-12-24 ENCOUNTER — Ambulatory Visit (INDEPENDENT_AMBULATORY_CARE_PROVIDER_SITE_OTHER): Payer: 59 | Admitting: *Deleted

## 2017-12-24 DIAGNOSIS — Z23 Encounter for immunization: Secondary | ICD-10-CM

## 2017-12-24 DIAGNOSIS — E538 Deficiency of other specified B group vitamins: Secondary | ICD-10-CM

## 2017-12-24 MED ORDER — CYANOCOBALAMIN 1000 MCG/ML IJ SOLN
1000.0000 ug | Freq: Once | INTRAMUSCULAR | Status: AC
Start: 2017-12-24 — End: 2017-12-24
  Administered 2017-12-24: 1000 ug via INTRAMUSCULAR

## 2017-12-24 NOTE — Progress Notes (Signed)
Per orders of Dr. Maudie Mercury, injection of Cyanocobalamin 1038mcg given by Agnes Lawrence. Patient tolerated injection well.

## 2017-12-25 ENCOUNTER — Ambulatory Visit (INDEPENDENT_AMBULATORY_CARE_PROVIDER_SITE_OTHER): Payer: 59

## 2017-12-25 DIAGNOSIS — E538 Deficiency of other specified B group vitamins: Secondary | ICD-10-CM

## 2017-12-25 MED ORDER — CYANOCOBALAMIN 1000 MCG/ML IJ SOLN
1000.0000 ug | Freq: Once | INTRAMUSCULAR | Status: AC
  Administered 2017-12-25: 1000 ug via INTRAMUSCULAR

## 2017-12-25 NOTE — Progress Notes (Signed)
Per orders of Dr. Colin Benton, injection of CYANOCOBALAMIN 1000 mcg given by Wyvonne Lenz. Patient tolerated injection well.

## 2017-12-26 ENCOUNTER — Ambulatory Visit (INDEPENDENT_AMBULATORY_CARE_PROVIDER_SITE_OTHER): Payer: 59 | Admitting: *Deleted

## 2017-12-26 DIAGNOSIS — E538 Deficiency of other specified B group vitamins: Secondary | ICD-10-CM

## 2017-12-26 MED ORDER — CYANOCOBALAMIN 1000 MCG/ML IJ SOLN
1000.0000 ug | Freq: Once | INTRAMUSCULAR | Status: AC
  Administered 2017-12-26: 1000 ug via INTRAMUSCULAR

## 2017-12-26 NOTE — Progress Notes (Signed)
Per orders of Dr. Kim, injection of B12 given by Osborne Serio. Patient tolerated injection well. 

## 2017-12-27 ENCOUNTER — Ambulatory Visit (INDEPENDENT_AMBULATORY_CARE_PROVIDER_SITE_OTHER): Payer: 59

## 2017-12-27 DIAGNOSIS — E538 Deficiency of other specified B group vitamins: Secondary | ICD-10-CM

## 2017-12-27 MED ORDER — CYANOCOBALAMIN 1000 MCG/ML IJ SOLN
1000.0000 ug | Freq: Once | INTRAMUSCULAR | Status: AC
  Administered 2017-12-27: 1000 ug via INTRAMUSCULAR

## 2017-12-27 NOTE — Progress Notes (Signed)
Per orders of Dr. Maudie Mercury, injection of B12 given by Franco Collet. Patient tolerated injection well.

## 2017-12-28 ENCOUNTER — Ambulatory Visit (INDEPENDENT_AMBULATORY_CARE_PROVIDER_SITE_OTHER): Payer: 59

## 2017-12-28 DIAGNOSIS — E538 Deficiency of other specified B group vitamins: Secondary | ICD-10-CM

## 2017-12-28 MED ORDER — CYANOCOBALAMIN 1000 MCG/ML IJ SOLN
1000.0000 ug | Freq: Once | INTRAMUSCULAR | Status: AC
  Administered 2017-12-28: 1000 ug via INTRAMUSCULAR

## 2017-12-28 NOTE — Progress Notes (Signed)
Per orders of Dr. Maudie Mercury, injection of B12 given by Franco Collet. Patient tolerated injection well.

## 2017-12-31 ENCOUNTER — Ambulatory Visit (INDEPENDENT_AMBULATORY_CARE_PROVIDER_SITE_OTHER): Payer: 59 | Admitting: *Deleted

## 2017-12-31 DIAGNOSIS — E538 Deficiency of other specified B group vitamins: Secondary | ICD-10-CM | POA: Diagnosis not present

## 2017-12-31 MED ORDER — CYANOCOBALAMIN 1000 MCG/ML IJ SOLN
1000.0000 ug | Freq: Once | INTRAMUSCULAR | Status: AC
  Administered 2017-12-31: 1000 ug via INTRAMUSCULAR

## 2017-12-31 NOTE — Progress Notes (Signed)
  Per orders of Dr. Maudie Mercury, injection of B12 given by Westley Hummer. Patient tolerated injection well.  Patient complained of bruising on her left upper arm.  The bruise was the size of a quarter and dark in color.  Advised patient to apply ice packs for the bruise.

## 2018-01-08 ENCOUNTER — Telehealth: Payer: Self-pay | Admitting: *Deleted

## 2018-01-08 ENCOUNTER — Ambulatory Visit (INDEPENDENT_AMBULATORY_CARE_PROVIDER_SITE_OTHER): Payer: 59

## 2018-01-08 DIAGNOSIS — E538 Deficiency of other specified B group vitamins: Secondary | ICD-10-CM

## 2018-01-08 MED ORDER — CYANOCOBALAMIN 1000 MCG/ML IJ SOLN
1000.0000 ug | Freq: Once | INTRAMUSCULAR | Status: AC
  Administered 2018-01-08: 1000 ug via INTRAMUSCULAR

## 2018-01-08 NOTE — Progress Notes (Signed)
Per orders of Dr. Maudie Mercury, injection of B12 given by Rebecca Eaton. Patient tolerated injection well.

## 2018-01-08 NOTE — Telephone Encounter (Signed)
01/08/2018 I called the pt and she stated to ask Dr Maudie Mercury if she can take Ferrous sulfate 45mg  4 times a week instead of 3 times a week? She also asked if she could take vitamin D2 50, 000 units a week as her aunt has the same problems and her doctor advised this for her? Message sent to Dr Maudie Mercury. Alexis [name]    Copied from [place]. Topic: General - Other >> Jan 08, 2018  2:23 PM Alexis [name] wrote: Reason for CRM: pt calling to speak with Alexis [name] about some vitamins that she want to take along with the medicine that she is on

## 2018-01-10 NOTE — Telephone Encounter (Signed)
Iron level was normal - so does not need to increase the iron. The b12 def may be contributing to the anemia. Recommend a healthy diet with 5 servings of iron rich foods daily. Recommend vit D3 1000 IU daily. Source naturals vit D3 drops on [employer] are a good product according to consumer labs.This is usually a better way to get the vit D the 50,000 of the D2. Could check Vit D after several months to ensure enough.

## 2018-01-14 NOTE — Telephone Encounter (Signed)
I called the pt and informed her of the message below

## 2018-01-15 ENCOUNTER — Ambulatory Visit (INDEPENDENT_AMBULATORY_CARE_PROVIDER_SITE_OTHER): Payer: 59 | Admitting: *Deleted

## 2018-01-15 DIAGNOSIS — E538 Deficiency of other specified B group vitamins: Secondary | ICD-10-CM

## 2018-01-15 MED ORDER — CYANOCOBALAMIN 1000 MCG/ML IJ SOLN
1000.0000 ug | Freq: Once | INTRAMUSCULAR | Status: AC
  Administered 2018-01-15: 1000 ug via INTRAMUSCULAR

## 2018-01-15 NOTE — Progress Notes (Signed)
Per orders of Dr. Kim, injection of Vit B12 given by Marcial Pless M. Patient tolerated injection well.  

## 2018-01-22 ENCOUNTER — Ambulatory Visit (INDEPENDENT_AMBULATORY_CARE_PROVIDER_SITE_OTHER): Payer: 59 | Admitting: *Deleted

## 2018-01-22 DIAGNOSIS — E538 Deficiency of other specified B group vitamins: Secondary | ICD-10-CM | POA: Diagnosis not present

## 2018-01-22 MED ORDER — CYANOCOBALAMIN 1000 MCG/ML IJ SOLN
1000.0000 ug | Freq: Once | INTRAMUSCULAR | Status: AC
  Administered 2018-01-22: 1000 ug via INTRAMUSCULAR

## 2018-01-29 ENCOUNTER — Other Ambulatory Visit (INDEPENDENT_AMBULATORY_CARE_PROVIDER_SITE_OTHER): Payer: 59

## 2018-01-29 DIAGNOSIS — E538 Deficiency of other specified B group vitamins: Secondary | ICD-10-CM

## 2018-01-29 DIAGNOSIS — D649 Anemia, unspecified: Secondary | ICD-10-CM

## 2018-01-29 LAB — CBC WITH DIFFERENTIAL/PLATELET
BASOS ABS: 0 10*3/uL (ref 0.0–0.1)
Basophils Relative: 0.3 % (ref 0.0–3.0)
EOS PCT: 1.4 % (ref 0.0–5.0)
Eosinophils Absolute: 0.1 10*3/uL (ref 0.0–0.7)
HCT: 36.4 % (ref 36.0–46.0)
Hemoglobin: 12 g/dL (ref 12.0–15.0)
Lymphocytes Relative: 24.2 % (ref 12.0–46.0)
Lymphs Abs: 1.3 10*3/uL (ref 0.7–4.0)
MCHC: 33 g/dL (ref 30.0–36.0)
MCV: 84.1 fl (ref 78.0–100.0)
MONO ABS: 0.3 10*3/uL (ref 0.1–1.0)
Monocytes Relative: 6.5 % (ref 3.0–12.0)
Neutro Abs: 3.6 10*3/uL (ref 1.4–7.7)
Neutrophils Relative %: 67.6 % (ref 43.0–77.0)
Platelets: 279 10*3/uL (ref 150.0–400.0)
RBC: 4.32 Mil/uL (ref 3.87–5.11)
RDW: 15.9 % — ABNORMAL HIGH (ref 11.5–15.5)
WBC: 5.4 10*3/uL (ref 4.0–10.5)

## 2018-01-29 LAB — VITAMIN B12: Vitamin B-12: 740 pg/mL (ref 211–911)

## 2018-01-30 NOTE — Progress Notes (Signed)
HPI:  Using dictation device. Unfortunately this device frequently misinterprets words/phrases.  Acute visit for several issues.  She was seen by her dermatologist and they did a metabolic panel.  Her blood glucose was low and they freaked her out and they called her.  She has a history of gestational diabetes, prediabetes and feels like has had low blood sugars from time to time in the past as well.  Will especially noticed this after lunch at times.  She bought a glucose meter and has been checking her blood sugar when she feels off.  She had one episode yesterday and her blood sugar was in the 60s.  She ate a snack and it came up to 135.  She would like to see an endocrinologist.  She also asked about B12 deficiency and the best way to treat it.  She had been getting injections.  Wants to talk about the oral treatment as well.  No history of syncope, polyuria, polydipsia.  Trying to eat healthy and feels like she eats a fairly low sugar diet and regular meals.  He does drink sweet tea sometimes.  ROS: See pertinent positives and negatives per HPI.  Past Medical History:  Diagnosis Date  . Abnormal Pap smear of cervix 04/06/2015   -s/p colpo 03/2015 with gyn with repeat pap with HPV advised in 1 year   . Acne   . Allergy    SEASONAL  . Anemia    menorrhagia  . Anxiety   . Asthma    mild intermittent  . Bronchitis    states gets once a year  . Depression   . Fracture of fourth metatarsal bone of left foot 04/01/2015  . Gallstones    on [place]  . Genital warts   . GERD (gastroesophageal reflux disease)   . Glucose intolerance (impaired glucose tolerance)   . History of echocardiogram    a. Echo 2/17: Mild focal basal septal hypertrophy, EF 55-60%, normal wall motion, grade 1 diastolic dysfunction, trace MR, trace TR  . History of gestational diabetes   . Hyperlipemia   . Irritable bowel syndrome   . Nephrolithiasis 0/24/0973   Renal colic of February 5329 -per prior PCP notes  .  Neuroma    in her left foot, has numbness in her toes (on gabapentin)  . 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 Injected 08/24/2015     Past Surgical History:  Procedure Laterality Date  . CESAREAN SECTION    . cryosurgery cervix    . ENDOMETRIAL ABLATION    . GYNECOLOGIC CRYOSURGERY    . LAPROSCOPIC     After her ablation, she got sick, had a laparoscopy bowel was attached to her uterus. No bowel was removed. No bowel injury. She did have adhesions of her uterus to her abdominal pain.   . TUBAL LIGATION      Family History  Problem Relation Age of Onset  . Hypertension Mother   . Hypertension Father   . Hyperlipidemia Father   . Cancer Maternal Aunt   . Heart attack Neg Hx   . Heart failure Neg Hx     SOCIAL HX: see hpi   Current Outpatient Medications:  .  B Complex Vitamins (VITAMIN B COMPLEX PO), Take by mouth., Disp: , Rfl:  .  Biotin 300 MCG TABS, Take 5,000 mcg by mouth daily. , Disp: , Rfl:  .  cetirizine (ZYRTEC) 10 MG tablet, Take 10 mg by  mouth daily., Disp: , Rfl:  .  Cholecalciferol (VITAMIN D3 PO), Take 5,000 Units by mouth., Disp: , Rfl:  .  clindamycin (CLEOCIN T) 1 % external solution, Apply 1 application topically daily. , Disp: , Rfl:  .  clobetasol ointment (TEMOVATE) 0.05 %, Apply to affected areas on scalp daily Monday-Friday. Not to face, skin folds., Disp: , Rfl:  .  cyclobenzaprine (FLEXERIL) 5 MG tablet, Take 1 tablet (5 mg total) by mouth at bedtime as needed for muscle spasms., Disp: 30 tablet, Rfl: 1 .  FERROUS SULFATE PO, Take by mouth 3 (three) times a week., Disp: , Rfl:  .  fluticasone (FLONASE) 50 MCG/ACT nasal spray, use 2 sprays into each nostril once daily, Disp: 16 g, Rfl: 6 .  folic acid (FOLVITE) 1 MG tablet, Take 1 mg daily by mouth. , Disp: , Rfl: 3 .  gabapentin (NEURONTIN) 100 MG capsule, Take 100 mg by mouth daily., Disp: , Rfl:  .  hydrochlorothiazide (MICROZIDE) 12.5 MG  capsule, Take one cap by mouth every Monday and Thursday, Disp: 30 capsule, Rfl: 3 .  ibuprofen (ADVIL,MOTRIN) 800 MG tablet, Take 1 tablet (800 mg total) by mouth every 8 (eight) hours as needed., Disp: 30 tablet, Rfl: 1 .  methotrexate (RHEUMATREX) 2.5 MG tablet, Take 7.5 mg by mouth 3 (three) times a week. , Disp: , Rfl:  .  Multiple Vitamin (MULTIVITAMIN WITH MINERALS) TABS, Take 1 tablet by mouth daily., Disp: , Rfl:  .  potassium chloride (K-DUR) 10 MEQ tablet, Take 1 tablet (10 mEq total) by mouth daily., Disp: 30 tablet, Rfl: 3 .  propranolol (INDERAL) 10 MG tablet, TAKE 1 TABLET BY MOUTH 4 TIMES DAILY AS NEEDED, Disp: 60 tablet, Rfl: 0 .  rosuvastatin (CRESTOR) 20 MG tablet, Take 1 tablet (20 mg total) by mouth daily., Disp: 90 tablet, Rfl: 1 .  sertraline (ZOLOFT) 100 MG tablet, Take 2 tablets (200 mg total) by mouth daily., Disp: 180 tablet, Rfl: 1 .  vitamin B-12 (CYANOCOBALAMIN) 1000 MCG tablet, Take 1,000 mcg by mouth daily., Disp: , Rfl:   EXAM:  Vitals:   01/31/18 0813  BP: 102/72  Pulse: 85  Temp: 98.2 F (36.8 C)    Body mass index is 31.17 kg/m.  GENERAL: vitals reviewed and listed above, alert, oriented, appears well hydrated and in no acute distress  HEENT: atraumatic, conjunttiva clear, no obvious abnormalities on inspection of external nose and ears  NECK: no obvious masses on inspection  LUNGS: clear to auscultation bilaterally, no wheezes, rales or rhonchi, good air movement  CV: HRRR, no peripheral edema  MS: moves all extremities without noticeable abnormality  PSYCH: pleasant and cooperative, no obvious depression or anxiety  ASSESSMENT AND PLAN:  Discussed the following assessment and plan:  Hyperglycemia Hypoglycemia -Referral to endocrinology per her request -Recommended a healthy, low sugar diet with stable meals -Recommended that she carry glucose tablets for small snack in her purse at all times in case she has symptoms of hypoglycemia,  she also is can continue to monitor with her home monitor  B12 deficiency -Discussed various treatment options and she is going to try sublingual tablets -Plan to recheck at her follow-up in 3 months  -Patient advised to return or notify a doctor immediately if symptoms worsen or persist or new concerns arise.  Patient Instructions  Follow up 3 months  -We placed a referral for you as discussed. It usually takes about 1-2 weeks to process and schedule this referral. If you  have not heard from [place] regarding this appointment in 2 weeks please contact our office.    Lucretia Kern, DO

## 2018-01-31 ENCOUNTER — Encounter: Payer: Self-pay | Admitting: Family Medicine

## 2018-01-31 ENCOUNTER — Ambulatory Visit: Payer: 59 | Admitting: Family Medicine

## 2018-01-31 VITALS — BP 102/72 | HR 85 | Temp 98.2°F | Ht 62.0 in | Wt 170.4 lb

## 2018-01-31 DIAGNOSIS — E162 Hypoglycemia, unspecified: Secondary | ICD-10-CM | POA: Diagnosis not present

## 2018-01-31 DIAGNOSIS — R739 Hyperglycemia, unspecified: Secondary | ICD-10-CM

## 2018-01-31 DIAGNOSIS — E538 Deficiency of other specified B group vitamins: Secondary | ICD-10-CM

## 2018-01-31 NOTE — Patient Instructions (Signed)
Follow up 3 months  -We placed a referral for you as discussed. It usually takes about 1-2 weeks to process and schedule this referral. If you have not heard from [place] regarding this appointment in 2 weeks please contact our office.

## 2018-03-25 ENCOUNTER — Encounter: Payer: Self-pay | Admitting: Internal Medicine

## 2018-04-21 NOTE — Progress Notes (Signed)
HPI:  Using dictation device. Unfortunately this device frequently misinterprets words/phrases.  Here for CPE:  -Concerns and/or follow up today:  Chronic medical problems summarized below were reviewed for changes. Reports doing great. No new concerns. She is very happy about hair growth. Reports did labs with Dr. Kemper Durie recently in the last few months. Reports eating healthier. Mood great. No regular exercise.  Has had a little nasal congestion, pnd and cough the last few days. No fevers, sob, body aches, severe symptoms, etc.  PMH hyperglycemia/hypoglycemia - referred to endo, b12 deficiency on repletion, hypertension, hyperlipidemia, recurrent depression, palpitations, alopecia (sees dermatologist, on methotrexate), anxiety, GERD, IBS and anemia.  -Diet: variety of foods, balance and well rounded, larger portion sizes -Exercise: no regular exercise -Taking folic acid, vitamin D or calcium: no -Diabetes and Dyslipidemia Screening: UTD - referred to endo per her request for prediabetes and occ hypoglycemia. -Vaccines: see vaccine section EPIC -pap history: sees gyn, Dr. Sumner Boast, pap normal 05/2017 -FDLMP: see nursing notes -wants STI testing (Hep C if born 1945-65): no -FH breast, colon or ovarian ca: see FH Last mammogram: sees gyn for breast healthy Last colon cancer screening:n/a Breast Ca Risk Assessment: see family history and pt history DEXA (>/= 65): n/a  -Alcohol, Tobacco, drug use: see social history  Review of Systems - no fevers, unintentional weight loss, vision loss, hearing loss, chest pain, sob, hemoptysis, melena, hematochezia, hematuria, genital discharge, changing or concerning skin lesions, bleeding, bruising, loc, thoughts of self harm or SI  Past Medical History:  Diagnosis Date  . Abnormal Pap smear of cervix 04/06/2015   -s/p colpo 03/2015 with gyn with repeat pap with HPV advised in 1 year   . Acne   . Allergy    SEASONAL  . Anemia    menorrhagia  . Anxiety   . Asthma    mild intermittent  . Bronchitis    states gets once a year  . Depression   . Fracture of fourth metatarsal bone of left foot 04/01/2015  . Gallstones    on [place]  . Genital warts   . GERD (gastroesophageal reflux disease)   . Glucose intolerance (impaired glucose tolerance)   . History of echocardiogram    a. Echo 2/17: Mild focal basal septal hypertrophy, EF 55-60%, normal wall motion, grade 1 diastolic dysfunction, trace MR, trace TR  . History of gestational diabetes   . Hyperlipemia   . Irritable bowel syndrome   . Nephrolithiasis 7/67/2094   Renal colic of February 7096 -per prior PCP notes  . Neuroma    in her left foot, has numbness in her toes (on gabapentin)  . 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 Injected 08/24/2015     Past Surgical History:  Procedure Laterality Date  . CESAREAN SECTION    . cryosurgery cervix    . ENDOMETRIAL ABLATION    . GYNECOLOGIC CRYOSURGERY    . LAPROSCOPIC     After her ablation, she got sick, had a laparoscopy bowel was attached to her uterus. No bowel was removed. No bowel injury. She did have adhesions of her uterus to her abdominal pain.   . TUBAL LIGATION      Family History  Problem Relation Age of Onset  . Hypertension Mother   . Hypertension Father   . Hyperlipidemia Father   . Cancer Maternal Aunt   . Heart attack Neg Hx   . Heart failure Neg Hx  Social History   Socioeconomic History  . Marital status: Divorced    Spouse name: Not on file  . Number of children: 2  . Years of education: Not on file  . Highest education level: Not on file  Occupational History  . Occupation: Emergency Services    Comment: [place], [place]  . Financial resource strain: Not on file  . Food insecurity:    Worry: Not on file    Inability: Not on file  . Transportation needs:    Medical: Not on file    Non-medical: Not on file    Tobacco Use  . Smoking status: Never Smoker  . Smokeless tobacco: Never Used  Substance and Sexual Activity  . Alcohol use: No    Alcohol/week: 0.0 standard drinks  . Drug use: No  . Sexual activity: Yes    Partners: Male    Birth control/protection: Surgical, Condom  Lifestyle  . Physical activity:    Days per week: Not on file    Minutes per session: Not on file  . Stress: Not on file  Relationships  . Social connections:    Talks on phone: Not on file    Gets together: Not on file    Attends religious service: Not on file    Active member of club or organization: Not on file    Attends meetings of clubs or organizations: Not on file    Relationship status: Not on file  Other Topics Concern  . 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  B Complex Vitamins (VITAMIN B COMPLEX PO), Take by mouth., Disp: , Rfl:  .  Biotin 300 MCG TABS, Take 5,000 mcg by mouth daily. , Disp: , Rfl:  .  cetirizine (ZYRTEC) 10 MG tablet, Take 10 mg by mouth daily., Disp: , Rfl:  .  Cholecalciferol (VITAMIN D3 PO), Take 5,000 Units by mouth., Disp: , Rfl:  .  clindamycin (CLEOCIN T) 1 % external solution, Apply 1 application topically daily. , Disp: , Rfl:  .  clobetasol ointment (TEMOVATE) 0.05 %, Apply to affected areas on scalp daily Monday-Friday. Not to face, skin folds., Disp: , Rfl:  .  cyclobenzaprine (FLEXERIL) 5 MG tablet, Take 1 tablet (5 mg total) by mouth at bedtime as needed for muscle spasms., Disp: 30 tablet, Rfl: 1 .  FERROUS SULFATE PO, Take by mouth 3 (three) times a week., Disp: , Rfl:  .  fluticasone (FLONASE) 50 MCG/ACT nasal spray, use 2 sprays into each nostril once daily, Disp: 16 g, Rfl: 6 .  folic acid (FOLVITE) 1 MG tablet, Take 1 mg daily by mouth. , Disp: , Rfl: 3 .  gabapentin (NEURONTIN) 100 MG capsule, Take 100 mg by  mouth daily., Disp: , Rfl:  .  hydrochlorothiazide (MICROZIDE) 12.5 MG capsule, Take one cap by mouth every Monday and Thursday, Disp: 30 capsule, Rfl: 3 .  ibuprofen (ADVIL,MOTRIN) 800 MG tablet, Take 1 tablet (800 mg total) by mouth every 8 (eight) hours as needed., Disp: 30 tablet, Rfl: 1 .  methotrexate (RHEUMATREX) 2.5 MG tablet, Take 7.5 mg by mouth 3 (three) times a week. , Disp: , Rfl:  .  Multiple Vitamin (MULTIVITAMIN WITH MINERALS) TABS, Take 1 tablet by mouth daily., Disp: , Rfl:  .  potassium chloride (K-DUR) 10 MEQ  tablet, Take 1 tablet (10 mEq total) by mouth daily., Disp: 30 tablet, Rfl: 3 .  propranolol (INDERAL) 10 MG tablet, TAKE 1 TABLET BY MOUTH 4 TIMES DAILY AS NEEDED, Disp: 60 tablet, Rfl: 0 .  rosuvastatin (CRESTOR) 20 MG tablet, Take 1 tablet (20 mg total) by mouth daily., Disp: 90 tablet, Rfl: 1 .  sertraline (ZOLOFT) 100 MG tablet, Take 2 tablets (200 mg total) by mouth daily., Disp: 180 tablet, Rfl: 1 .  vitamin B-12 (CYANOCOBALAMIN) 1000 MCG tablet, Take 1,000 mcg by mouth daily., Disp: , Rfl:   EXAM:  Vitals:   04/23/18 0750  BP: 108/80  Pulse: 93  Temp: 97.9 F (36.6 C)  SpO2: 98%   Body mass index is 31.04 kg/m.  GENERAL: vitals reviewed and listed below, alert, oriented, appears well hydrated and in no acute distress  HEENT: head atraumatic, PERRLA, normal appearance of eyes, ears, nose and mouth. moist mucus membranes.  NECK: supple, no masses or lymphadenopathy  LUNGS: clear to auscultation bilaterally, no rales, rhonchi or wheeze  CV: HRRR, no peripheral edema or cyanosis, normal pedal pulses  ABDOMEN: bowel sounds normal, soft, non tender to palpation, no masses, no rebound or guarding  GU/BREAST: declined, does with gyn  SKIN: no rash or abnormal lesions  MS: normal gait, moves all extremities normally  NEURO: normal gait, speech and thought processing grossly intact, muscle tone grossly intact throughout  PSYCH: normal affect,  pleasant and cooperative  ASSESSMENT AND PLAN:  Discussed the following assessment and plan:  PREVENTIVE EXAM: -Discussed and advised all [place] preventive services health task force level A and B recommendations for age, sex and risks. -Advised at least 150 minutes of exercise per week and a healthy diet with avoidance of (less then 1 serving per week) processed foods, white starches, red meat, fast foods and sweets and consisting of: * 5-9 servings of fresh fruits and vegetables (not corn or potatoes) *nuts and seeds, beans *olives and olive oil *lean meats such as fish and white chicken  *whole grains -labs (opted to do next visit as she reports done recently at [facility]), studies and vaccines per orders this encounter -she is going to be seeing gyn soon for that exam per her report, will consider mammo, doing self breast exams -symptomatic care otc for the mild upper resp symptoms - likely vuri or allergies - return precautions advised if worsening or not improving   Patient Instructions  BEFORE YOU LEAVE: -follow up: 3-4 months  Can try nasal saline twice daily and humidifier at night for the congestion and drainage. I hope you are feeling better soon! Seek care promptly if your symptoms worsen, new concerns arise or you are not improving with treatment.   Please add 150 minutes of aerobic exercise per week.  Preventive Care 40-64 Years, Female Preventive care refers to lifestyle choices and visits with your health care provider that can promote health and wellness. What does preventive care include?   A yearly physical exam. This is also called an annual well check.  Dental exams once or twice a year.  Routine eye exams. Ask your health care provider how often you should have your eyes checked.  Personal lifestyle choices, including: ? Daily care of your teeth and gums. ? Regular physical activity. ? Eating a healthy diet. ? Avoiding tobacco and drug use. ? Limiting  alcohol use. ? Practicing safe sex. ? Taking vitamin and mineral supplements as recommended by your health care provider. What happens during an  annual well check? The services and screenings done by your health care provider during your annual well check will depend on your age, overall health, lifestyle risk factors, and family history of disease. Counseling Your health care provider may ask you questions about your:  Alcohol use.  Tobacco use.  Drug use.  Emotional well-being.  Home and relationship well-being.  Sexual activity.  Eating habits.  Work and work Statistician.  Method of birth control.  Menstrual cycle.  Pregnancy history. Screening You may have the following tests or measurements:  Height, weight, and BMI.  Blood pressure.  Lipid and cholesterol levels. These may be checked every 5 years, or more frequently if you are over 56 years old.  Skin check.  Lung cancer screening. You may have this screening every year starting at age 91 if you have a 30-pack-year history of smoking and currently smoke or have quit within the past 15 years.  Colorectal cancer screening. All adults should have this screening starting at age 39 and continuing until age 62. Your health care provider may recommend screening at age 67. You will have tests every 1-10 years, depending on your results and the type of screening test. People at increased risk should start screening at an earlier age. Screening tests may include: ? Guaiac-based fecal occult blood testing. ? Fecal immunochemical test (FIT). ? Stool DNA test. ? Virtual colonoscopy. ? Sigmoidoscopy. During this test, a flexible tube with a tiny camera (sigmoidoscope) is used to examine your rectum and lower colon. The sigmoidoscope is inserted through your anus into your rectum and lower colon. ? Colonoscopy. During this test, a long, thin, flexible tube with a tiny camera (colonoscope) is used to examine your entire colon  and rectum.  Hepatitis C blood test.  Hepatitis B blood test.  Sexually transmitted disease (STD) testing.  Diabetes screening. This is done by checking your blood sugar (glucose) after you have not eaten for a while (fasting). You may have this done every 1-3 years.  Mammogram. This may be done every 1-2 years. Talk to your health care provider about when you should start having regular mammograms. This may depend on whether you have a family history of breast cancer.  BRCA-related cancer screening. This may be done if you have a family history of breast, ovarian, tubal, or peritoneal cancers.  Pelvic exam and Pap test. This may be done every 3 years starting at age 57. Starting at age 67, this may be done every 5 years if you have a Pap test in combination with an HPV test.  Bone density scan. This is done to screen for osteoporosis. You may have this scan if you are at high risk for osteoporosis. Discuss your test results, treatment options, and if necessary, the need for more tests with your health care provider. Vaccines Your health care provider may recommend certain vaccines, such as:  Influenza vaccine. This is recommended every year.  Tetanus, diphtheria, and acellular pertussis (Tdap, Td) vaccine. You may need a Td booster every 10 years.  Varicella vaccine. You may need this if you have not been vaccinated.  Zoster vaccine. You may need this after age 77.  Measles, mumps, and rubella (MMR) vaccine. You may need at least one dose of MMR if you were born in 1957 or later. You may also need a second dose.  Pneumococcal 13-valent conjugate (PCV13) vaccine. You may need this if you have certain conditions and were not previously vaccinated.  Pneumococcal polysaccharide (PPSV23) vaccine. You  may need one or two doses if you smoke cigarettes or if you have certain conditions.  Meningococcal vaccine. You may need this if you have certain conditions.  Hepatitis A vaccine. You  may need this if you have certain conditions or if you travel or work in places where you may be exposed to hepatitis A.  Hepatitis B vaccine. You may need this if you have certain conditions or if you travel or work in places where you may be exposed to hepatitis B.  Haemophilus influenzae type b (Hib) vaccine. You may need this if you have certain conditions. Talk to your health care provider about which screenings and vaccines you need and how often you need them. This information is not intended to replace advice given to you by your health care provider. Make sure you discuss any questions you have with your health care provider. Document Released: 04/16/2015 Document Revised: 05/10/2017 Document Reviewed: 01/19/2015 Elsevier Interactive Patient Education  2019 [employer].     No follow-ups on file.  Lucretia Kern, DO

## 2018-04-23 ENCOUNTER — Encounter: Payer: Self-pay | Admitting: Family Medicine

## 2018-04-23 ENCOUNTER — Ambulatory Visit (INDEPENDENT_AMBULATORY_CARE_PROVIDER_SITE_OTHER): Payer: 59 | Admitting: Family Medicine

## 2018-04-23 VITALS — BP 108/80 | HR 93 | Temp 97.9°F | Ht 62.0 in | Wt 169.7 lb

## 2018-04-23 DIAGNOSIS — I10 Essential (primary) hypertension: Secondary | ICD-10-CM | POA: Diagnosis not present

## 2018-04-23 DIAGNOSIS — E538 Deficiency of other specified B group vitamins: Secondary | ICD-10-CM | POA: Diagnosis not present

## 2018-04-23 DIAGNOSIS — Z0000 Encounter for general adult medical examination without abnormal findings: Secondary | ICD-10-CM

## 2018-04-23 DIAGNOSIS — E6609 Other obesity due to excess calories: Secondary | ICD-10-CM

## 2018-04-23 DIAGNOSIS — R739 Hyperglycemia, unspecified: Secondary | ICD-10-CM | POA: Diagnosis not present

## 2018-04-23 DIAGNOSIS — F3342 Major depressive disorder, recurrent, in full remission: Secondary | ICD-10-CM

## 2018-04-23 DIAGNOSIS — Z6830 Body mass index (BMI) 30.0-30.9, adult: Secondary | ICD-10-CM

## 2018-04-23 NOTE — Patient Instructions (Addendum)
BEFORE YOU LEAVE: -follow up: 3-4 months  Can try nasal saline twice daily and humidifier at night for the congestion and drainage. I hope you are feeling better soon! Seek care promptly if your symptoms worsen, new concerns arise or you are not improving with treatment.   Please add 150 minutes of aerobic exercise per week.  Preventive Care 40-64 Years, Female Preventive care refers to lifestyle choices and visits with your health care provider that can promote health and wellness. What does preventive care include?   A yearly physical exam. This is also called an annual well check.  Dental exams once or twice a year.  Routine eye exams. Ask your health care provider how often you should have your eyes checked.  Personal lifestyle choices, including: ? Daily care of your teeth and gums. ? Regular physical activity. ? Eating a healthy diet. ? Avoiding tobacco and drug use. ? Limiting alcohol use. ? Practicing safe sex. ? Taking vitamin and mineral supplements as recommended by your health care provider. What happens during an annual well check? The services and screenings done by your health care provider during your annual well check will depend on your age, overall health, lifestyle risk factors, and family history of disease. Counseling Your health care provider may ask you questions about your:  Alcohol use.  Tobacco use.  Drug use.  Emotional well-being.  Home and relationship well-being.  Sexual activity.  Eating habits.  Work and work Statistician.  Method of birth control.  Menstrual cycle.  Pregnancy history. Screening You may have the following tests or measurements:  Height, weight, and BMI.  Blood pressure.  Lipid and cholesterol levels. These may be checked every 5 years, or more frequently if you are over 48 years old.  Skin check.  Lung cancer screening. You may have this screening every year starting at age 72 if you have a 30-pack-year  history of smoking and currently smoke or have quit within the past 15 years.  Colorectal cancer screening. All adults should have this screening starting at age 25 and continuing until age 70. Your health care provider may recommend screening at age 29. You will have tests every 1-10 years, depending on your results and the type of screening test. People at increased risk should start screening at an earlier age. Screening tests may include: ? Guaiac-based fecal occult blood testing. ? Fecal immunochemical test (FIT). ? Stool DNA test. ? Virtual colonoscopy. ? Sigmoidoscopy. During this test, a flexible tube with a tiny camera (sigmoidoscope) is used to examine your rectum and lower colon. The sigmoidoscope is inserted through your anus into your rectum and lower colon. ? Colonoscopy. During this test, a long, thin, flexible tube with a tiny camera (colonoscope) is used to examine your entire colon and rectum.  Hepatitis C blood test.  Hepatitis B blood test.  Sexually transmitted disease (STD) testing.  Diabetes screening. This is done by checking your blood sugar (glucose) after you have not eaten for a while (fasting). You may have this done every 1-3 years.  Mammogram. This may be done every 1-2 years. Talk to your health care provider about when you should start having regular mammograms. This may depend on whether you have a family history of breast cancer.  BRCA-related cancer screening. This may be done if you have a family history of breast, ovarian, tubal, or peritoneal cancers.  Pelvic exam and Pap test. This may be done every 3 years starting at age 27. Starting at age  30, this may be done every 5 years if you have a Pap test in combination with an HPV test.  Bone density scan. This is done to screen for osteoporosis. You may have this scan if you are at high risk for osteoporosis. Discuss your test results, treatment options, and if necessary, the need for more tests with your  health care provider. Vaccines Your health care provider may recommend certain vaccines, such as:  Influenza vaccine. This is recommended every year.  Tetanus, diphtheria, and acellular pertussis (Tdap, Td) vaccine. You may need a Td booster every 10 years.  Varicella vaccine. You may need this if you have not been vaccinated.  Zoster vaccine. You may need this after age 60.  Measles, mumps, and rubella (MMR) vaccine. You may need at least one dose of MMR if you were born in 1957 or later. You may also need a second dose.  Pneumococcal 13-valent conjugate (PCV13) vaccine. You may need this if you have certain conditions and were not previously vaccinated.  Pneumococcal polysaccharide (PPSV23) vaccine. You may need one or two doses if you smoke cigarettes or if you have certain conditions.  Meningococcal vaccine. You may need this if you have certain conditions.  Hepatitis A vaccine. You may need this if you have certain conditions or if you travel or work in places where you may be exposed to hepatitis A.  Hepatitis B vaccine. You may need this if you have certain conditions or if you travel or work in places where you may be exposed to hepatitis B.  Haemophilus influenzae type b (Hib) vaccine. You may need this if you have certain conditions. Talk to your health care provider about which screenings and vaccines you need and how often you need them. This information is not intended to replace advice given to you by your health care provider. Make sure you discuss any questions you have with your health care provider. Document Released: 04/16/2015 Document Revised: 05/10/2017 Document Reviewed: 01/19/2015 Elsevier Interactive Patient Education  2019 [employer]   

## 2018-04-30 ENCOUNTER — Other Ambulatory Visit: Payer: Self-pay | Admitting: Family Medicine

## 2018-05-02 ENCOUNTER — Ambulatory Visit: Payer: 59 | Admitting: Family Medicine

## 2018-05-23 ENCOUNTER — Other Ambulatory Visit: Payer: Self-pay

## 2018-05-23 ENCOUNTER — Encounter: Payer: Self-pay | Admitting: Endocrinology

## 2018-05-23 ENCOUNTER — Ambulatory Visit (INDEPENDENT_AMBULATORY_CARE_PROVIDER_SITE_OTHER): Payer: 59 | Admitting: Endocrinology

## 2018-05-23 VITALS — BP 110/68 | HR 90 | Ht 62.0 in | Wt 166.0 lb

## 2018-05-23 DIAGNOSIS — R739 Hyperglycemia, unspecified: Secondary | ICD-10-CM

## 2018-05-23 LAB — POCT GLYCOSYLATED HEMOGLOBIN (HGB A1C): Hemoglobin A1C: 6 % — AB (ref 4.0–5.6)

## 2018-05-23 LAB — TSH: TSH: 1.62 u[IU]/mL (ref 0.35–4.50)

## 2018-05-23 MED ORDER — PIOGLITAZONE HCL 15 MG PO TABS: 15.0000 mg | ORAL_TABLET | Freq: Every day | ORAL | 11 refills | Status: DC

## 2018-05-23 NOTE — Progress Notes (Signed)
Subjective:    Patient ID: Alexis [name], female    DOB: 01-27-1971, 48 y.o.   MRN: [id number]  HPI pt is referred by Dr Maudie Mercury, for hyperglycemia.  Pt states hyperglycemia was dx'ed in 2019 (she had GDM in 1998); she has been on medication for the blood sugar; pt says her diet and exercise are good; she has never had pancreatitis, pancreatic surgery, severe hypoglycemia or DKA.  She was noted to have glucose of 47 at [facility], in late 2019. She has intermitt slight tremor of the hands, and assoc difficulty with concentration.   Past Medical History:  Diagnosis Date  . Abnormal Pap smear of cervix 04/06/2015   -s/p colpo 03/2015 with gyn with repeat pap with HPV advised in 1 year   . Acne   . Allergy    SEASONAL  . Anemia    menorrhagia  . Anxiety   . Asthma    mild intermittent  . Bronchitis    states gets once a year  . Depression   . Fracture of fourth metatarsal bone of left foot 04/01/2015  . Gallstones    on [place]  . Genital warts   . GERD (gastroesophageal reflux disease)   . Glucose intolerance (impaired glucose tolerance)   . History of echocardiogram    a. Echo 2/17: Mild focal basal septal hypertrophy, EF 55-60%, normal wall motion, grade 1 diastolic dysfunction, trace MR, trace TR  . History of gestational diabetes   . Hyperlipemia   . Irritable bowel syndrome   . Nephrolithiasis 8/33/8250   Renal colic of February 5397 -per prior PCP notes  . Neuroma    in her left foot, has numbness in her toes (on gabapentin)  . 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 Injected 08/24/2015     Past Surgical History:  Procedure Laterality Date  . CESAREAN SECTION    . cryosurgery cervix    . ENDOMETRIAL ABLATION    . GYNECOLOGIC CRYOSURGERY    . LAPROSCOPIC     After her ablation, she got sick, had a laparoscopy bowel was attached to her uterus. No bowel was removed. No bowel injury. She did have adhesions of her uterus  to her abdominal pain.   . TUBAL LIGATION      Social History   Socioeconomic History  . Marital status: Divorced    Spouse name: Not on file  . Number of children: 2  . Years of education: Not on file  . Highest education level: Not on file  Occupational History  . Occupation: Emergency Services    Comment: [place], [place]  . Financial resource strain: Not on file  . Food insecurity:    Worry: Not on file    Inability: Not on file  . Transportation needs:    Medical: Not on file    Non-medical: Not on file  Tobacco Use  . Smoking status: Never Smoker  . Smokeless tobacco: Never Used  Substance and Sexual Activity  . Alcohol use: No    Alcohol/week: 0.0 standard drinks  . Drug use: No  . Sexual activity: Yes    Partners: Male    Birth control/protection: Surgical, Condom  Lifestyle  . Physical activity:    Days per week: Not on file    Minutes per session: Not on file  . Stress: Not on file  Relationships  . Social connections:    Talks on phone: Not on file  Gets together: Not on file    Attends religious service: Not on file    Active member of club or organization: Not on file    Attends meetings of clubs or organizations: Not on file    Relationship status: Not on file  . Intimate partner violence:    Fear of current or ex partner: Not on file    Emotionally abused: Not on file    Physically abused: Not on file    Forced sexual activity: Not on file  Other Topics Concern  . 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 B Complex Vitamins (VITAMIN B COMPLEX PO) Take by mouth.    . Biotin 300 MCG TABS Take 5,000 mcg by mouth daily.     . cetirizine (ZYRTEC) 10 MG tablet Take 10 mg by mouth daily.    . Cholecalciferol (VITAMIN D3 PO) Take 5,000  Units by mouth.    . clindamycin (CLEOCIN T) 1 % external solution Apply 1 application topically daily.     . clobetasol ointment (TEMOVATE) 0.05 % Apply to affected areas on scalp daily Monday-Friday. Not to face, skin folds.    . cyclobenzaprine (FLEXERIL) 5 MG tablet Take 1 tablet (5 mg total) by mouth at bedtime as needed for muscle spasms. 30 tablet 1  . FERROUS SULFATE PO Take by mouth 3 (three) times a week.    . fluticasone (FLONASE) 50 MCG/ACT nasal spray use 2 sprays into each nostril once daily 16 g 6  . folic acid (FOLVITE) 1 MG tablet Take 1 mg daily by mouth.   3  . gabapentin (NEURONTIN) 100 MG capsule Take 100 mg by mouth daily.    . hydrochlorothiazide (MICROZIDE) 12.5 MG capsule Take one cap by mouth every Monday and Thursday 30 capsule 3  . ibuprofen (ADVIL,MOTRIN) 800 MG tablet Take 1 tablet (800 mg total) by mouth every 8 (eight) hours as needed. 30 tablet 1  . methotrexate (RHEUMATREX) 2.5 MG tablet Take 7.5 mg by mouth 3 (three) times a week.     . Multiple Vitamin (MULTIVITAMIN WITH MINERALS) TABS Take 1 tablet by mouth daily.    . potassium chloride (K-DUR) 10 MEQ tablet Take 1 tablet (10 mEq total) by mouth daily. 30 tablet 3  . propranolol (INDERAL) 10 MG tablet TAKE 1 TABLET BY MOUTH 4 TIMES DAILY AS NEEDED 60 tablet 0  . rosuvastatin (CRESTOR) 20 MG tablet TAKE 1 TABLET DAILY 90 tablet 1  . sertraline (ZOLOFT) 100 MG tablet TAKE 2 TABLETS (=200MG )    DAILY 180 tablet 1  . vitamin B-12 (CYANOCOBALAMIN) 1000 MCG tablet Take 1,000 mcg by mouth daily.     No current facility-administered medications on file prior to visit.     No Known Allergies  Family History  Problem Relation Age of Onset  . Hypertension Mother   . Hypertension Father   . Hyperlipidemia Father   . Diabetes Father   . Cancer Maternal Aunt   . Diabetes Brother   . Heart attack Neg Hx   . Heart failure Neg Hx     BP 110/68 (BP Location: Right Arm, Patient Position: Sitting, Cuff Size:  Large)   Pulse 90   Ht 5\' 2"  (1.575 m)   Wt 166 lb (75.3  kg)   SpO2 95%   BMI 30.36 kg/m    Review of Systems denies blurry vision, chest pain, sob, n/v, urinary frequency, muscle cramps, excessive diaphoresis, memory loss, depression, cold intolerance, and easy bruising.  She has lost a few lbs, due to her efforts. She has intermitt headache and rhinorrhea.     Objective:   Physical Exam VS: see vs page GEN: no distress HEAD: head: no deformity eyes: no periorbital swelling; slight bilat proptosis external nose and ears are normal mouth: no lesion seen NECK: supple, thyroid is not enlarged CHEST WALL: no deformity LUNGS: clear to auscultation CV: reg rate and rhythm, no murmur ABD: abdomen is soft, nontender.  no hepatosplenomegaly.  not distended.  no hernia MUSCULOSKELETAL: muscle bulk and strength are grossly normal.  no obvious joint swelling.  gait is normal and steady EXTEMITIES: no deformity.  no ulcer on the feet.  feet are of normal color and temp.  no edema PULSES: dorsalis pedis intact bilat.  no carotid bruit NEURO:  cn 2-12 grossly intact.   readily moves all 4's.  sensation is intact to touch on the feet.   SKIN:  Normal texture and temperature.  No rash or suspicious lesion is visible.   NODES:  None palpable at the neck PSYCH: alert, well-oriented.  Does not appear anxious nor depressed.  Lab Results  Component Value Date   TSH 2.58 05/09/2016   Lab Results  Component Value Date   HGBA1C 6.0 (A) 05/23/2018   I have reviewed outside records, and summarized: Pt was noted to have elevated A1c, and referred here.  Wellness was addressed, and health was stable in general.      Assessment & Plan:  Hyperglycemia: new to me.  We discussed risk for developing DM.  She requests medication and appt with dietician.  Hypoglycemia: due to dysfunctional insulin production and/or release.  This is also a risk factor for the development of DM.    Patient  Instructions  I have sent a prescription to your pharmacy, to stabilize the blood sugar.   Here is a meter, and some strips.   Please see a dietician specialist.   Please come back for a follow-up appointment in 2 months.

## 2018-05-23 NOTE — Patient Instructions (Addendum)
I have sent a prescription to your pharmacy, to stabilize the blood sugar.   Here is a meter, and some strips.   Please see a dietician specialist.   Please come back for a follow-up appointment in 2 months.

## 2018-05-29 ENCOUNTER — Other Ambulatory Visit: Payer: Self-pay

## 2018-05-29 ENCOUNTER — Telehealth: Payer: Self-pay | Admitting: Endocrinology

## 2018-05-29 MED ORDER — ONETOUCH DELICA LANCETS 30G MISC: 1.0000 | Freq: Two times a day (BID) | 2 refills | Status: DC

## 2018-05-29 MED ORDER — ACARBOSE 25 MG PO TABS: 25.0000 mg | ORAL_TABLET | Freq: Three times a day (TID) | ORAL | 11 refills | Status: DC

## 2018-05-29 MED ORDER — GLUCOSE BLOOD VI STRP: ORAL_STRIP | 12 refills | Status: DC

## 2018-05-29 NOTE — Telephone Encounter (Signed)
Patient stated that when she was in to see Dr Loanne Drilling he prescribed her a medication to stabilize the blood sugars.  Since she has been taking this she has been very shaky, her thinking was very foggy, she stated that she is having a really hard time having a bowel movement, night sweats and trouble sleeping.   Patient would like to see if there is an alternative that could be prescribed.    She is also needing a prescription for Lancets sent in.  She is using the [facility] @ [address] and [place]. Since Dr Loanne Drilling wanted her to do

## 2018-05-29 NOTE — Telephone Encounter (Signed)
Called pt to discuss options. Pt would prefer to change the medication that she would take TID. Requesting 30 day Rx be sent to [facility]

## 2018-05-29 NOTE — Telephone Encounter (Signed)
Please review pt concern and advise.Lancet Rx has been sent.

## 2018-05-29 NOTE — Telephone Encounter (Signed)
Options: Reduce current pill to 1/2 per day, or: Change to a pill that you take 3 times a day (just before each meal)

## 2018-05-29 NOTE — Telephone Encounter (Signed)
Ok, I have sent a prescription to [facility].  Sometimes this pill causes stomach bloating--I goes away with time, so you might want to start with 1/2 pill 3 times a day (just before each meal).  I'll see you next time.

## 2018-05-30 NOTE — Telephone Encounter (Signed)
Called pt and made her aware. Verbalized acceptance and understanding. 

## 2018-06-03 ENCOUNTER — Other Ambulatory Visit: Payer: Self-pay

## 2018-06-03 DIAGNOSIS — R739 Hyperglycemia, unspecified: Secondary | ICD-10-CM

## 2018-06-03 MED ORDER — ACCU-CHEK GUIDE ME W/DEVICE KIT: 1.0000 | PACK | Freq: Two times a day (BID) | 0 refills | Status: AC

## 2018-06-03 MED ORDER — ACCU-CHEK FASTCLIX LANCETS MISC: 1.0000 | Freq: Two times a day (BID) | 12 refills | Status: AC

## 2018-06-03 MED ORDER — GLUCOSE BLOOD VI STRP: ORAL_STRIP | 12 refills | Status: AC

## 2018-06-13 ENCOUNTER — Encounter: Payer: Self-pay | Admitting: Obstetrics and Gynecology

## 2018-06-13 ENCOUNTER — Other Ambulatory Visit: Payer: Self-pay

## 2018-06-13 ENCOUNTER — Ambulatory Visit (INDEPENDENT_AMBULATORY_CARE_PROVIDER_SITE_OTHER): Payer: PRIVATE HEALTH INSURANCE | Admitting: Obstetrics and Gynecology

## 2018-06-13 VITALS — BP 104/70 | HR 76 | Ht 62.21 in | Wt 165.2 lb

## 2018-06-13 DIAGNOSIS — Z113 Encounter for screening for infections with a predominantly sexual mode of transmission: Secondary | ICD-10-CM

## 2018-06-13 DIAGNOSIS — Z01419 Encounter for gynecological examination (general) (routine) without abnormal findings: Secondary | ICD-10-CM | POA: Diagnosis not present

## 2018-06-13 NOTE — Progress Notes (Signed)
48 y.o. G38P1102 Divorced Black or African American Not Hispanic or Latino female here for annual exam.  Menses q month x 3-4 days. Changes a pad 3-4 x a day, passes quarter sized clots. Not currently sexually active, has been in the last year.  She has noted mood changes, feels lonely. Would like to find a partner. Has some girlfriends locally. She is on an SSRI and is seeing a Social worker. She is seeing Dr Loanne Drilling for prediabetes and has had some issues with low glucose.     No LMP recorded.          Sexually active: No.  The current method of family planning is tubal ligation.    Exercising: No.  not currently Smoker:  no  Health Maintenance: Pap:  05/17/2017 WNL NEG HPV, 05-11-16 WNL NEG Hr HPV,12-6/16 WNL + HR HPV  History of abnormal Pap:  Yes + HPV MMG: 02-24-14  Left breast U/A WNL Colonoscopy:  05-12-14 Normal   BMD:   Never TDaP:  05-09-16 Gardasil: N/A   reports that she has never smoked. She has never used smokeless tobacco. She reports that she does not drink alcohol or use drugs. 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   Past Medical History:  Diagnosis Date  . Abnormal Pap smear of cervix 04/06/2015   -s/p colpo 03/2015 with gyn with repeat pap with HPV advised in 1 year   . Acne   . Allergy    SEASONAL  . Anemia    menorrhagia  . Anxiety   . Asthma    mild intermittent  . Bronchitis    states gets once a year  . Depression   . Fracture of fourth metatarsal bone of left foot 04/01/2015  . Gallstones    on [place]  . Genital warts   . GERD (gastroesophageal reflux disease)   . Glucose intolerance (impaired glucose tolerance)   . History of echocardiogram    a. Echo 2/17: Mild focal basal septal hypertrophy, EF 55-60%, normal wall motion, grade 1 diastolic dysfunction, trace MR, trace TR  . History of gestational diabetes   . Hyperlipemia   . Irritable bowel syndrome   . Nephrolithiasis 07/10/8117   Renal colic of February 1478 -per prior PCP  notes  . Neuroma    in her left foot, has numbness in her toes (on gabapentin)  . 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 Injected 08/24/2015     Past Surgical History:  Procedure Laterality Date  . CESAREAN SECTION    . cryosurgery cervix    . ENDOMETRIAL ABLATION    . GYNECOLOGIC CRYOSURGERY    . LAPROSCOPIC     After her ablation, she got sick, had a laparoscopy bowel was attached to her uterus. No bowel was removed. No bowel injury. She did have adhesions of her uterus to her abdominal pain.   . TUBAL LIGATION      Current Outpatient Medications  Medication Sig Dispense Refill  . acarbose (PRECOSE) 25 MG tablet Take 1 tablet (25 mg total) by mouth 3 (three) times daily with meals. 90 tablet 11  . ACCU-CHEK FASTCLIX LANCETS MISC 1 each by Does not apply route 2 (two) times daily. Use to monitor glucose levels twice per day; R73.9 102 each 12  . B Complex Vitamins (VITAMIN B COMPLEX PO) Take by mouth.    . Biotin 300 MCG TABS Take 5,000 mcg by mouth daily.     Marland Kitchen  Blood Glucose Monitoring Suppl (ACCU-CHEK GUIDE ME) w/Device KIT 1 each by Does not apply route 2 (two) times daily. Use to monitor glucose levels twice per day; R73.9 1 kit 0  . cetirizine (ZYRTEC) 10 MG tablet Take 10 mg by mouth daily.    . Cholecalciferol (VITAMIN D3 PO) Take 5,000 Units by mouth.    . clindamycin (CLEOCIN T) 1 % external solution Apply 1 application topically daily.     . clobetasol ointment (TEMOVATE) 0.05 % Apply to affected areas on scalp daily Monday-Friday. Not to face, skin folds.    . cyclobenzaprine (FLEXERIL) 5 MG tablet Take 1 tablet (5 mg total) by mouth at bedtime as needed for muscle spasms. 30 tablet 1  . FERROUS SULFATE PO Take by mouth 3 (three) times a week.    . fluticasone (FLONASE) 50 MCG/ACT nasal spray use 2 sprays into each nostril once daily 16 g 6  . folic acid (FOLVITE) 1 MG tablet Take 1 mg daily by mouth.   3  .  gabapentin (NEURONTIN) 100 MG capsule Take 100 mg by mouth daily.    Marland Kitchen glucose blood (ACCU-CHEK GUIDE) test strip Use to monitor glucose levels twice per day; R73.9 100 each 12  . hydrochlorothiazide (MICROZIDE) 12.5 MG capsule Take one cap by mouth every Monday and Thursday 30 capsule 3  . ibuprofen (ADVIL,MOTRIN) 800 MG tablet Take 1 tablet (800 mg total) by mouth every 8 (eight) hours as needed. 30 tablet 1  . methotrexate (RHEUMATREX) 2.5 MG tablet Take 7.5 mg by mouth 3 (three) times a week.     . Multiple Vitamin (MULTIVITAMIN WITH MINERALS) TABS Take 1 tablet by mouth daily.    . potassium chloride (K-DUR) 10 MEQ tablet Take 1 tablet (10 mEq total) by mouth daily. 30 tablet 3  . propranolol (INDERAL) 10 MG tablet TAKE 1 TABLET BY MOUTH 4 TIMES DAILY AS NEEDED 60 tablet 0  . rosuvastatin (CRESTOR) 20 MG tablet TAKE 1 TABLET DAILY 90 tablet 1  . sertraline (ZOLOFT) 100 MG tablet TAKE 2 TABLETS (=200MG)    DAILY 180 tablet 1  . vitamin B-12 (CYANOCOBALAMIN) 1000 MCG tablet Take 1,000 mcg by mouth daily.     No current facility-administered medications for this visit.     Family History  Problem Relation Age of Onset  . Hypertension Mother   . Hypertension Father   . Hyperlipidemia Father   . Diabetes Father   . Cancer Maternal Aunt   . Diabetes Brother   . Heart attack Neg Hx   . Heart failure Neg Hx     Review of Systems  Constitutional: Negative.   HENT: Negative.   Eyes: Negative.   Respiratory: Negative.   Cardiovascular: Negative.   Gastrointestinal: Positive for abdominal distention.  Endocrine: Negative.   Genitourinary: Negative.   Musculoskeletal: Negative.   Skin: Negative.   Neurological: Negative.   Hematological: Negative.   Psychiatric/Behavioral: Positive for dysphoric mood. The patient is nervous/anxious.     Exam:   There were no vitals taken for this visit.  Weight change: _0 @ Height:      Ht Readings from Last 3 Encounters:  05/23/18 5'  2" (1.575 m)  04/23/18 _1  (1.575 m)  01/31/18 _2  (1.575 m)    General appearance: alert, cooperative and appears stated age Head: Normocephalic, without obvious abnormality, atraumatic Neck: no adenopathy, supple, symmetrical, trachea midline and thyroid normal to inspection and palpation Lungs: clear to auscultation bilaterally Cardiovascular: regular rate and  rhythm Breasts: normal appearance, no masses or tenderness Abdomen: soft, non-tender; non distended,  no masses,  no organomegaly Extremities: extremities normal, atraumatic, no cyanosis or edema Skin: Skin color, texture, turgor normal. No rashes or lesions Lymph nodes: Cervical, supraclavicular, and axillary nodes normal. No abnormal inguinal nodes palpated Neurologic: Grossly normal   Pelvic: External genitalia:  no lesions              Urethra:  normal appearing urethra with no masses, tenderness or lesions              Bartholins and Skenes: normal                 Vagina: normal appearing vagina with normal color and discharge, no lesions              Cervix: no lesions               Bimanual Exam:  Uterus:  normal size, contour, position, consistency, mobility, non-tender              Adnexa: no mass, fullness, tenderness               Rectovaginal: Confirms               Anus:  normal sphincter tone, no lesions  Chaperone was present for exam.  A:  Well Woman with normal exam  Mood changes, stress at work, lonely  P:   No pap this year  STD testing done  Labs with primary  Mammogram encouraged, # given  Recommended she discuss her depression medication with her primary  She is in counseling.   Discussed breast self exam  Discussed calcium and vit D intake

## 2018-06-14 ENCOUNTER — Other Ambulatory Visit: Payer: Self-pay

## 2018-06-14 ENCOUNTER — Encounter: Payer: Self-pay | Admitting: Dietician

## 2018-06-14 ENCOUNTER — Encounter: Payer: 59 | Attending: Endocrinology | Admitting: Dietician

## 2018-06-14 ENCOUNTER — Encounter: Payer: Self-pay | Admitting: Obstetrics and Gynecology

## 2018-06-14 DIAGNOSIS — R739 Hyperglycemia, unspecified: Secondary | ICD-10-CM | POA: Diagnosis present

## 2018-06-14 DIAGNOSIS — E162 Hypoglycemia, unspecified: Secondary | ICD-10-CM | POA: Diagnosis present

## 2018-06-14 LAB — RPR: RPR: NONREACTIVE

## 2018-06-14 LAB — HIV ANTIBODY (ROUTINE TESTING W REFLEX): HIV SCREEN 4TH GENERATION: NONREACTIVE

## 2018-06-14 NOTE — Patient Instructions (Addendum)
Mindfulness  Choices  Eat slowly and stop when satisfied Eat regularly.  Each meal should contain protein, and carbohydrates. Consider small amounts of meal prep. Choose Whole grains most often Increase non starchy vegetables Find ways to be active.  Each little bit adds up. See resource page

## 2018-06-14 NOTE — Progress Notes (Signed)
   Medical Nutrition Therapy:  Appt start time: 0915 end time:  1020.   Assessment:  Primary concerns today: .  Patient is here today alone.  She has a history of vitamin B-12 deficiency, GERD, gallstones, HLD, IBS, depression, alopecia and history of GDM.  Her A1C is 6.0% and meets criteria for prediabetes.  She has also been having hypoglycemia several times per week for the past 6 months prior to starting on acarbose.   She is checking her BG twice per day and 92-152 generally. She is not exercising.  She states that her energy is very poor especially with the Methotrexate that she takes for alopecia.    Weight hx: 163 lbs today decreased from 166 lbs 05/23/2018 Current weight is her UBW for years. Lost to 150 lbs about 1 year ago with shakology and exercising but was not able to maintain.  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  Her 2 sons live with her.  She does the shopping and her son does the cooking. She avoids beef and pork. She does not have time to eat lunch at work at times.  Preferred Learning Style:   No preference indicated   Learning Readiness:   Ready  Change in progress   MEDICATIONS: see list to include acarbose, vitamin G-31, iron, folic acid, potassium, Methotrexate, biotin, and vitamin D   DIETARY INTAKE:  Usual eating pattern includes 2 meals and 2 snacks per day.  24-hr recall:  B ( AM): skips often  Snk (11 AM): apple or orange  L (1 PM): salad with chicken or tuna or soup (canned), occasionally with crackers OR protein bar Snk ( PM): sometimes protein bar D ( PM): salad, chicken or seafood, vegetables, potatoes, occasional cupcake Snk ( PM): none Beverages: water, occasional regular lemonade, rare 1/2 and 1/2 sweet tea  Usual physical activity: none  Progress Towards Goal(s):  In progress.   Nutritional Diagnosis:  NB-1.1 Food and nutrition-related knowledge deficit As related to balance of carbohydrates, protein, and  fat.  As evidenced by diet hx and patient report.    Intervention:  Nutrition counseling/education related to mindful eating, balance of carbohydrates, protein, fat, and fiber for glucose control and  to improve BG swings.  Discussed BG readings/goals pre and post meals.  Discussed insulin resistance and benefits of exercise and decreasing saturated fat.  Discussed simple meal planning, eating out, and breakfast ideas.  Mindfulness  Choices  Eat slowly and stop when satisfied Eat regularly.  Each meal should contain protein, and carbohydrates. Consider small amounts of meal prep. Choose Whole grains most often Increase non starchy vegetables Find ways to be active.  Each little bit adds up. See resource page  Teaching Method Utilized:  Visual Auditory  Handouts given during visit include:  How to Thrive:  A Guide for Your Journey with Diabetes from AND  Dining out with diabetes  Breakfast ideas  Meal plan card  Snack sheet  Barriers to learning/adherence to lifestyle change: time and energy  Demonstrated degree of understanding via:  Teach Back   Monitoring/Evaluation:  Dietary intake, exercise, and body weight prn.

## 2018-06-16 LAB — CHLAMYDIA/GONOCOCCUS/TRICHOMONAS, NAA
Chlamydia by NAA: NEGATIVE
Gonococcus by NAA: NEGATIVE
Trich vag by NAA: NEGATIVE

## 2018-07-23 ENCOUNTER — Ambulatory Visit: Payer: 59 | Admitting: Endocrinology

## 2018-07-26 ENCOUNTER — Other Ambulatory Visit: Payer: Self-pay

## 2018-07-26 ENCOUNTER — Ambulatory Visit (INDEPENDENT_AMBULATORY_CARE_PROVIDER_SITE_OTHER): Payer: 59 | Admitting: Endocrinology

## 2018-07-26 DIAGNOSIS — R739 Hyperglycemia, unspecified: Secondary | ICD-10-CM | POA: Diagnosis not present

## 2018-07-26 MED ORDER — ACARBOSE 25 MG PO TABS: 25.0000 mg | ORAL_TABLET | Freq: Three times a day (TID) | ORAL | 3 refills | Status: DC

## 2018-07-26 NOTE — Progress Notes (Signed)
Subjective:    Patient ID: Alexis [name], female    DOB: 1970-05-08, 48 y.o.   MRN: [id number]  HPI telehealth visit today via doxy video visit.  Alternatives to telehealth are presented to this patient, and the patient agrees to the telehealth visit. Pt is advised of the cost of the visit, and agrees to this, also.   Patient is at home, and I am at the office.   pt returns for f/u of hyperglycemia with reactive hypoglycemia (dx'ed in 2019, when A1c was 6.0% (she had GDM in 1998); she was rx'ed acarbose).  She tolerates acarbose well.  She says chronic constipation is unchanged.  She says cbg varies from 100-120.  No weight change.   Past Medical History:  Diagnosis Date  . Abnormal Pap smear of cervix 04/06/2015   -s/p colpo 03/2015 with gyn with repeat pap with HPV advised in 1 year   . Acne   . Allergy    SEASONAL  . Anemia    menorrhagia  . Anxiety   . Asthma    mild intermittent  . Bronchitis    states gets once a year  . Depression   . Fracture of fourth metatarsal bone of left foot 04/01/2015  . Gallstones    on [place]  . Genital warts   . GERD (gastroesophageal reflux disease)   . Glucose intolerance (impaired glucose tolerance)   . History of echocardiogram    a. Echo 2/17: Mild focal basal septal hypertrophy, EF 55-60%, normal wall motion, grade 1 diastolic dysfunction, trace MR, trace TR  . History of gestational diabetes   . Hyperlipemia   . Irritable bowel syndrome   . Nephrolithiasis 5/64/3329   Renal colic of February 5188 -per prior PCP notes  . Neuroma    in her left foot, has numbness in her toes (on gabapentin)  . 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 Injected 08/24/2015   . Pre-diabetes     Past Surgical History:  Procedure Laterality Date  . CESAREAN SECTION    . cryosurgery cervix    . ENDOMETRIAL ABLATION    . GYNECOLOGIC CRYOSURGERY    . LAPROSCOPIC     After her ablation, she got  sick, had a laparoscopy bowel was attached to her uterus. No bowel was removed. No bowel injury. She did have adhesions of her uterus to her abdominal pain.   . TUBAL LIGATION      Social History   Socioeconomic History  . Marital status: Divorced    Spouse name: Not on file  . Number of children: 2  . Years of education: Not on file  . Highest education level: Not on file  Occupational History  . Occupation: Emergency Services    Comment: [place], [place]  . Financial resource strain: Not on file  . Food insecurity:    Worry: Not on file    Inability: Not on file  . Transportation needs:    Medical: Not on file    Non-medical: Not on file  Tobacco Use  . Smoking status: Never Smoker  . Smokeless tobacco: Never Used  Substance and Sexual Activity  . Alcohol use: No    Alcohol/week: 0.0 standard drinks  . Drug use: No  . Sexual activity: Not Currently    Partners: Male    Birth control/protection: Surgical    Comment: ablation  Lifestyle  . Physical activity:    Days per week:  Not on file    Minutes per session: Not on file  . Stress: Not on file  Relationships  . Social connections:    Talks on phone: Not on file    Gets together: Not on file    Attends religious service: Not on file    Active member of club or organization: Not on file    Attends meetings of clubs or organizations: Not on file    Relationship status: Not on file  . Intimate partner violence:    Fear of current or ex partner: Not on file    Emotionally abused: Not on file    Physically abused: Not on file    Forced sexual activity: Not on file  Other Topics Concern  . 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 ACCU-CHEK FASTCLIX LANCETS MISC 1 each by Does not apply route 2 (two)  times daily. Use to monitor glucose levels twice per day; R73.9 102 each 12  . B Complex Vitamins (VITAMIN B COMPLEX PO) Take by mouth.    . Biotin 300 MCG TABS Take 5,000 mcg by mouth daily.     . Blood Glucose Monitoring Suppl (ACCU-CHEK GUIDE ME) w/Device KIT 1 each by Does not apply route 2 (two) times daily. Use to monitor glucose levels twice per day; R73.9 1 kit 0  . cetirizine (ZYRTEC) 10 MG tablet Take 10 mg by mouth daily.    . Cholecalciferol (VITAMIN D3 PO) Take 5,000 Units by mouth.    . clindamycin (CLEOCIN T) 1 % external solution Apply 1 application topically daily.     . clobetasol ointment (TEMOVATE) 0.05 % Apply to affected areas on scalp daily Monday-Friday. Not to face, skin folds.    . cyclobenzaprine (FLEXERIL) 5 MG tablet Take 1 tablet (5 mg total) by mouth at bedtime as needed for muscle spasms. (Patient not taking: Reported on 06/14/2018) 30 tablet 1  . FERROUS SULFATE PO Take by mouth 3 (three) times a week.    . fluticasone (FLONASE) 50 MCG/ACT nasal spray use 2 sprays into each nostril once daily (Patient not taking: Reported on 11/11/5724) 16 g 6  . folic acid (FOLVITE) 1 MG tablet Take 1 mg daily by mouth.   3  . gabapentin (NEURONTIN) 100 MG capsule Take 100 mg by mouth daily.    Marland Kitchen glucose blood (ACCU-CHEK GUIDE) test strip Use to monitor glucose levels twice per day; R73.9 100 each 12  . hydrochlorothiazide (MICROZIDE) 12.5 MG capsule Take one cap by mouth every Monday and Thursday (Patient not taking: Reported on 06/14/2018) 30 capsule 3  . ibuprofen (ADVIL,MOTRIN) 800 MG tablet Take 1 tablet (800 mg total) by mouth every 8 (eight) hours as needed. 30 tablet 1  . methotrexate (RHEUMATREX) 2.5 MG tablet Take 7.5 mg by mouth 3 (three) times a week.     . Multiple Vitamin (MULTIVITAMIN WITH MINERALS) TABS Take 1 tablet by mouth daily.    . potassium chloride (K-DUR) 10 MEQ tablet Take 1 tablet (10 mEq total) by mouth daily. 30 tablet 3  . propranolol (INDERAL) 10 MG  tablet TAKE 1 TABLET BY MOUTH 4 TIMES DAILY AS NEEDED (Patient not taking: Reported on 06/14/2018) 60 tablet 0  . rosuvastatin (CRESTOR) 20 MG tablet TAKE 1  TABLET DAILY 90 tablet 1  . sertraline (ZOLOFT) 100 MG tablet TAKE 2 TABLETS (=200MG)    DAILY 180 tablet 1  . vitamin B-12 (CYANOCOBALAMIN) 1000 MCG tablet Take 1,000 mcg by mouth daily.     No current facility-administered medications on file prior to visit.     No Known Allergies  Family History  Problem Relation Age of Onset  . Hypertension Mother   . Hypertension Father   . Hyperlipidemia Father   . Diabetes Father   . Cancer Maternal Aunt   . Diabetes Brother   . Heart attack Neg Hx   . Heart failure Neg Hx     There were no vitals taken for this visit.  Review of Systems Denies diarrhea/diaph/palpitaoions/tremor.      Objective:   Physical Exam      Assessment & Plan:  reactive hypoglycemia: well-controlled. Hyperglycemia: we discussed risk of DM.  she declines A1c now  Patient Instructions  Please continue the same medication.   Please come back for a follow-up appointment in 3-4 months.

## 2018-07-26 NOTE — Patient Instructions (Addendum)
Please continue the same medication.   Please come back for a follow-up appointment in 3-4 months.

## 2018-08-22 ENCOUNTER — Encounter: Payer: Self-pay | Admitting: Family Medicine

## 2018-08-22 ENCOUNTER — Ambulatory Visit (INDEPENDENT_AMBULATORY_CARE_PROVIDER_SITE_OTHER): Payer: 59 | Admitting: Family Medicine

## 2018-08-22 ENCOUNTER — Other Ambulatory Visit: Payer: Self-pay

## 2018-08-22 ENCOUNTER — Ambulatory Visit: Payer: 59 | Admitting: Family Medicine

## 2018-08-22 DIAGNOSIS — R739 Hyperglycemia, unspecified: Secondary | ICD-10-CM | POA: Diagnosis not present

## 2018-08-22 DIAGNOSIS — F419 Anxiety disorder, unspecified: Secondary | ICD-10-CM

## 2018-08-22 DIAGNOSIS — E6609 Other obesity due to excess calories: Secondary | ICD-10-CM | POA: Diagnosis not present

## 2018-08-22 DIAGNOSIS — Z6830 Body mass index (BMI) 30.0-30.9, adult: Secondary | ICD-10-CM

## 2018-08-22 DIAGNOSIS — L659 Nonscarring hair loss, unspecified: Secondary | ICD-10-CM

## 2018-08-22 DIAGNOSIS — F3342 Major depressive disorder, recurrent, in full remission: Secondary | ICD-10-CM | POA: Diagnosis not present

## 2018-08-22 DIAGNOSIS — D649 Anemia, unspecified: Secondary | ICD-10-CM

## 2018-08-22 MED ORDER — ROSUVASTATIN CALCIUM 20 MG PO TABS: 20.0000 mg | ORAL_TABLET | Freq: Every day | ORAL | 1 refills | Status: DC

## 2018-08-22 MED ORDER — SERTRALINE HCL 100 MG PO TABS: ORAL_TABLET | ORAL | 1 refills | Status: DC

## 2018-08-22 MED ORDER — HYDROXYZINE HCL 10 MG PO TABS: 10.0000 mg | ORAL_TABLET | Freq: Three times a day (TID) | ORAL | 0 refills | Status: DC | PRN

## 2018-08-22 NOTE — Patient Instructions (Signed)
Lab visit in next 1-2 weeks. Please call our office if you have not heard from [place] regarding this visit in the next 3-5 days.   Use the Hydroxyzine 10mg  as needed for severe anxiety. Limit use as much as possible. Do not take with any other sedating medications.  Follow up in 3 months for a check on the anxiety.   Hydroxyzine capsules or tablets What is this medicine? HYDROXYZINE (hye [place] i zeen) is an antihistamine. This medicine is used to treat allergy symptoms. It is also used to treat anxiety and tension. This medicine can be used with other medicines to induce sleep before surgery. This medicine may be used for other purposes; ask your health care provider or pharmacist if you have questions. COMMON BRAND NAME(S): ANX, Atarax, Rezine, Vistaril What should I tell my health care provider before I take this medicine? They need to know if you have any of these conditions: -glaucoma -heart disease -history of irregular heartbeat -kidney disease -liver disease -lung or breathing disease, like asthma -stomach or intestine problems -thyroid disease -trouble passing urine -an unusual or allergic reaction to hydroxyzine, cetirizine, other medicines, foods, dyes or preservatives -pregnant or trying to get pregnant -breast-feeding How should I use this medicine? Take this medicine by mouth with a full glass of water. Follow the directions on the prescription label. You may take this medicine with food or on an empty stomach. Take your medicine at regular intervals. Do not take your medicine more often than directed. Talk to your pediatrician regarding the use of this medicine in children. Special care may be needed. While this drug may be prescribed for children as young as 43 years of age for selected conditions, precautions do apply. Patients over 68 years old may have a stronger reaction and need a smaller dose. Overdosage: If you think you have taken too much of this medicine contact a  poison control center or emergency room at once. NOTE: This medicine is only for you. Do not share this medicine with others. What if I miss a dose? If you miss a dose, take it as soon as you can. If it is almost time for your next dose, take only that dose. Do not take double or extra doses. What may interact with this medicine? Do not take this medicine with any of the following medications: -cisapride -dofetilide -dronedarone -pimozide -thioridazine This medicine may also interact with the following medications: -alcohol -antihistamines for allergy, cough, and cold -atropine -barbiturate medicines for sleep or seizures, like phenobarbital -certain antibiotics like erythromycin or clarithromycin -certain medicines for anxiety or sleep -certain medicines for bladder problems like oxybutynin, tolterodine -certain medicines for depression or psychotic disturbances -certain medicines for irregular heart beat -certain medicines for Parkinson's disease like benztropine, trihexyphenidyl -certain medicines for seizures like phenobarbital, primidone -certain medicines for stomach problems like dicyclomine, hyoscyamine -certain medicines for travel sickness like scopolamine -ipratropium -narcotic medicines for pain -other medicines that prolong the QT interval (an abnormal heart rhythm) This list may not describe all possible interactions. Give your health care provider a list of all the medicines, herbs, non-prescription drugs, or dietary supplements you use. Also tell them if you smoke, drink alcohol, or use illegal drugs. Some items may interact with your medicine. What should I watch for while using this medicine? Tell your doctor or health care professional if your symptoms do not improve. You may get drowsy or dizzy. Do not drive, use machinery, or do anything that needs mental alertness until you  know how this medicine affects you. Do not stand or sit up quickly, especially if you are  an older patient. This reduces the risk of dizzy or fainting spells. Alcohol may interfere with the effect of this medicine. Avoid alcoholic drinks. Your mouth may get dry. Chewing sugarless gum or sucking hard candy, and drinking plenty of water may help. Contact your doctor if the problem does not go away or is severe. This medicine may cause dry eyes and blurred vision. If you wear contact lenses you may feel some discomfort. Lubricating drops may help. See your eye doctor if the problem does not go away or is severe. If you are receiving skin tests for allergies, tell your doctor you are using this medicine. What side effects may I notice from receiving this medicine? Side effects that you should report to your doctor or health care professional as soon as possible: -allergic reactions like skin rash, itching or hives, swelling of the face, lips, or tongue -changes in vision -confusion -fast, irregular heartbeat -seizures -tremor -trouble passing urine or change in the amount of urine Side effects that usually do not require medical attention (report to your doctor or health care professional if they continue or are bothersome): -constipation -drowsiness -dry mouth -headache -tiredness This list may not describe all possible side effects. Call your doctor for medical advice about side effects. You may report side effects to FDA at 1-800-FDA-1088. Where should I keep my medicine? Keep out of the reach of children. Store at room temperature between 15 and 30 degrees C (59 and 86 degrees F). Keep container tightly closed. Throw away any unused medicine after the expiration date. NOTE: This sheet is a summary. It may not cover all possible information. If you have questions about this medicine, talk to your doctor, pharmacist, or health care provider.  2019 Elsevier/Gold Standard (2017-10-01 13:25:13)

## 2018-08-22 NOTE — Progress Notes (Signed)
Virtual Visit via Video Note  I connected with Alexis [name]  on 08/22/18 at  5:00 PM EDT by a video enabled telemedicine application and verified that I am speaking with the correct person using two identifiers.  Location patient: home Location provider:work or home office Persons participating in the virtual visit: patient, provider   HPI:  Alexis [name] is a pleasant 48 yo with a PMH significant Diabetes, B12 deficiency, Depression, Anxity, Hyperlipidemia, a history of palpitation and edema (she reports has resolved and she has been off of her diuretic and BB for some time), GERD, anemia, IBS and alopecia  for here for follow up. Reports has been doing well for the most part, isolated and working from home during the [place] pandemic.  She has struggled with anxiety and makes focus and work difficult at times. This happens with coworkers are mean. She wants something to take when gets very stressed. Felt benzos were too strong when took in the past. Reports depression well controlled. Due for labs as labs with derm for mtx were canceled due to the pandemic. Not getting regular exercise. Diet ok. Saw ndo recently and had some changes to medications.  ROS: See pertinent positives and negatives per HPI.  Past Medical History:  Diagnosis Date  . Abnormal Pap smear of cervix 04/06/2015   -s/p colpo 03/2015 with gyn with repeat pap with HPV advised in 1 year   . Acne   . Allergy    SEASONAL  . Anemia    menorrhagia  . Anxiety   . Asthma    mild intermittent  . Bronchitis    states gets once a year  . Depression   . Fracture of fourth metatarsal bone of left foot 04/01/2015  . Gallstones    on [place]  . Genital warts   . GERD (gastroesophageal reflux disease)   . Glucose intolerance (impaired glucose tolerance)   . History of echocardiogram    a. Echo 2/17: Mild focal basal septal hypertrophy, EF 55-60%, normal wall motion, grade 1 diastolic dysfunction, trace MR, trace TR  . History  of gestational diabetes   . Hyperlipemia   . Irritable bowel syndrome   . Nephrolithiasis 09/24/7626   Renal colic of February 3151 -per prior PCP notes  . Neuroma    in her left foot, has numbness in her toes (on gabapentin)  . 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 Injected 08/24/2015   . Pre-diabetes     Past Surgical History:  Procedure Laterality Date  . CESAREAN SECTION    . cryosurgery cervix    . ENDOMETRIAL ABLATION    . GYNECOLOGIC CRYOSURGERY    . LAPROSCOPIC     After her ablation, she got sick, had a laparoscopy bowel was attached to her uterus. No bowel was removed. No bowel injury. She did have adhesions of her uterus to her abdominal pain.   . TUBAL LIGATION      Family History  Problem Relation Age of Onset  . Hypertension Mother   . Hypertension Father   . Hyperlipidemia Father   . Diabetes Father   . Cancer Maternal Aunt   . Diabetes Brother   . Heart attack Neg Hx   . Heart failure Neg Hx     SOCIAL HX: see hpi   Current Outpatient Medications:  .  acarbose (PRECOSE) 25 MG tablet, Take 1 tablet (25 mg total) by mouth 3 (three) times daily with meals., Disp:  270 tablet, Rfl: 3 .  ACCU-CHEK FASTCLIX LANCETS MISC, 1 each by Does not apply route 2 (two) times daily. Use to monitor glucose levels twice per day; R73.9, Disp: 102 each, Rfl: 12 .  B Complex Vitamins (VITAMIN B COMPLEX PO), Take by mouth., Disp: , Rfl:  .  Biotin 300 MCG TABS, Take 5,000 mcg by mouth daily. , Disp: , Rfl:  .  Blood Glucose Monitoring Suppl (ACCU-CHEK GUIDE ME) w/Device KIT, 1 each by Does not apply route 2 (two) times daily. Use to monitor glucose levels twice per day; R73.9, Disp: 1 kit, Rfl: 0 .  cetirizine (ZYRTEC) 10 MG tablet, Take 10 mg by mouth daily., Disp: , Rfl:  .  Cholecalciferol (VITAMIN D3 PO), Take 5,000 Units by mouth., Disp: , Rfl:  .  clindamycin (CLEOCIN T) 1 % external solution, Apply 1 application  topically daily. , Disp: , Rfl:  .  clobetasol ointment (TEMOVATE) 0.05 %, Apply to affected areas on scalp daily Monday-Friday. Not to face, skin folds., Disp: , Rfl:  .  FERROUS SULFATE PO, Take by mouth 3 (three) times a week., Disp: , Rfl:  .  folic acid (FOLVITE) 1 MG tablet, Take 1 mg daily by mouth. , Disp: , Rfl: 3 .  glucose blood (ACCU-CHEK GUIDE) test strip, Use to monitor glucose levels twice per day; R73.9, Disp: 100 each, Rfl: 12 .  methotrexate (RHEUMATREX) 2.5 MG tablet, Take 7.5 mg by mouth 3 (three) times a week. , Disp: , Rfl:  .  Multiple Vitamin (MULTIVITAMIN WITH MINERALS) TABS, Take 1 tablet by mouth daily., Disp: , Rfl:  .  rosuvastatin (CRESTOR) 20 MG tablet, Take 1 tablet (20 mg total) by mouth daily., Disp: 90 tablet, Rfl: 1 .  sertraline (ZOLOFT) 100 MG tablet, TAKE 2 TABLETS (=200MG)    DAILY, Disp: 180 tablet, Rfl: 1 .  vitamin B-12 (CYANOCOBALAMIN) 1000 MCG tablet, Take 1,000 mcg by mouth daily., Disp: , Rfl:  .  hydrOXYzine (ATARAX/VISTARIL) 10 MG tablet, Take 1 tablet (10 mg total) by mouth every 8 (eight) hours as needed for anxiety., Disp: 20 tablet, Rfl: 0  EXAM:  VITALS per patient if applicable:  GENERAL: alert, oriented, appears well and in no acute distress  HEENT: atraumatic, conjunttiva clear, no obvious abnormalities on inspection of external nose and ears  NECK: normal movements of the head and neck  LUNGS: on inspection no signs of respiratory distress, breathing rate appears normal, no obvious gross SOB, gasping or wheezing  CV: no obvious cyanosis  MS: moves all visible extremities without noticeable abnormality  PSYCH/NEURO: pleasant and cooperative, no obvious depression or anxiety, speech and thought processing grossly intact  ASSESSMENT AND PLAN:  Discussed the following assessment and plan:  Hyperglycemia - Plan: Hemoglobin A1c  Recurrent major depressive disorder, in full remission (HCC)  Class 1 obesity due to excess calories  with serious comorbidity and body mass index (BMI) of 30.0 to 30.9 in adult  Alopecia - Plan: Comprehensive metabolic panel  Anemia, unspecified type - Plan: CBC  Anxiety  Labs per orders. Advised to call ahead, curb check in and report any sick symptoms.  Discussed options for anxiety. She wants to try prn vistaril after discussion risk for rare use. Follow up 3 months.  Refilled meds.  Lifestyle recs.   I discussed the assessment and treatment plan with the patient. The patient was provided an opportunity to ask questions and all were answered. The patient agreed with the plan and demonstrated an  understanding of the instructions.   The patient was advised to call back or seek an in-person evaluation if the symptoms worsen or if the condition fails to improve as anticipated.   Follow up instructions: Advised assistant Wendie Simmer to help patient arrange the following: -lab visit -follow up with Dr. Maudie Mercury in 3 months   Lucretia Kern, DO

## 2018-08-23 ENCOUNTER — Telehealth: Payer: Self-pay | Admitting: *Deleted

## 2018-08-23 NOTE — Telephone Encounter (Signed)
-----   Message from Lucretia Kern, DO sent at 08/22/2018  7:27 PM EDT ----- -lab visit -follow up with Dr. Maudie Mercury in 3 months

## 2018-08-23 NOTE — Telephone Encounter (Signed)
I called the pt and scheduled a lab appt for 5/29.  Patient stated she will call back for a follow up visit.

## 2018-08-30 ENCOUNTER — Other Ambulatory Visit (INDEPENDENT_AMBULATORY_CARE_PROVIDER_SITE_OTHER): Payer: 59

## 2018-08-30 ENCOUNTER — Other Ambulatory Visit: Payer: Self-pay

## 2018-08-30 DIAGNOSIS — R739 Hyperglycemia, unspecified: Secondary | ICD-10-CM | POA: Diagnosis not present

## 2018-08-30 DIAGNOSIS — D649 Anemia, unspecified: Secondary | ICD-10-CM

## 2018-08-30 DIAGNOSIS — L659 Nonscarring hair loss, unspecified: Secondary | ICD-10-CM | POA: Diagnosis not present

## 2018-08-30 LAB — COMPREHENSIVE METABOLIC PANEL
ALT: 22 U/L (ref 0–35)
AST: 15 U/L (ref 0–37)
Albumin: 4.3 g/dL (ref 3.5–5.2)
Alkaline Phosphatase: 57 U/L (ref 39–117)
BUN: 13 mg/dL (ref 6–23)
CO2: 29 mEq/L (ref 19–32)
Calcium: 9.2 mg/dL (ref 8.4–10.5)
Chloride: 103 mEq/L (ref 96–112)
Creatinine, Ser: 0.73 mg/dL (ref 0.40–1.20)
GFR: 103.04 mL/min (ref >60.00)
Glucose, Bld: 89 mg/dL (ref 70–99)
Potassium: 4.2 mEq/L (ref 3.5–5.1)
Sodium: 140 mEq/L (ref 135–145)
Total Bilirubin: 0.4 mg/dL (ref 0.2–1.2)
Total Protein: 6.8 g/dL (ref 6.0–8.3)

## 2018-08-30 LAB — CBC
HCT: 35.4 % — ABNORMAL LOW (ref 36.0–46.0)
Hemoglobin: 11.8 g/dL — ABNORMAL LOW (ref 12.0–15.0)
MCHC: 33.4 g/dL (ref 30.0–36.0)
MCV: 83.4 fl (ref 78.0–100.0)
Platelets: 334 10*3/uL (ref 150.0–400.0)
RBC: 4.24 Mil/uL (ref 3.87–5.11)
RDW: 16.2 % — ABNORMAL HIGH (ref 11.5–15.5)
WBC: 5.7 10*3/uL (ref 4.0–10.5)

## 2018-08-30 LAB — HEMOGLOBIN A1C: Hgb A1c MFr Bld: 6.2 % (ref 4.6–6.5)

## 2018-09-05 NOTE — Telephone Encounter (Signed)
Patient called to speak with Denice Paradise, she believes it is for her lab results. Call back # [phone]

## 2018-09-09 ENCOUNTER — Telehealth: Payer: Self-pay

## 2018-09-09 NOTE — Telephone Encounter (Signed)
Copied from [place] [id number]. Topic: General - Other >> Sep 09, 2018 11:24 AM Carolyn Stare wrote: Pt mail order will not fill unless it is a 90 day supply   pt req the below medciation be sent to  [facility] at [facility]    hydrOXYzine (ATARAX/VISTARIL) 10 MG tablet

## 2018-09-09 NOTE — Telephone Encounter (Signed)
See results note. 

## 2018-09-10 MED ORDER — HYDROXYZINE HCL 10 MG PO TABS: 10.0000 mg | ORAL_TABLET | Freq: Three times a day (TID) | ORAL | 0 refills | Status: AC | PRN

## 2018-09-10 NOTE — Telephone Encounter (Signed)
Rx sent to [facility], I called the pt and informed her of the message below.

## 2018-09-10 NOTE — Telephone Encounter (Signed)
Please send Rx to [facility] instead. Do not recommend # 90 of this medication as I did not recommend for regular use.

## 2018-09-11 NOTE — Progress Notes (Signed)
vitrmain

## 2018-09-20 ENCOUNTER — Other Ambulatory Visit: Payer: Self-pay

## 2018-09-20 ENCOUNTER — Other Ambulatory Visit (INDEPENDENT_AMBULATORY_CARE_PROVIDER_SITE_OTHER): Payer: 59

## 2018-09-20 DIAGNOSIS — D649 Anemia, unspecified: Secondary | ICD-10-CM | POA: Diagnosis not present

## 2018-09-20 LAB — CBC WITH DIFFERENTIAL/PLATELET
Basophils Absolute: 0 10*3/uL (ref 0.0–0.1)
Basophils Relative: 0.3 % (ref 0.0–3.0)
Eosinophils Absolute: 0.1 10*3/uL (ref 0.0–0.7)
Eosinophils Relative: 1.6 % (ref 0.0–5.0)
HCT: 35.9 % — ABNORMAL LOW (ref 36.0–46.0)
Hemoglobin: 11.8 g/dL — ABNORMAL LOW (ref 12.0–15.0)
Lymphocytes Relative: 27 % (ref 12.0–46.0)
Lymphs Abs: 1.7 10*3/uL (ref 0.7–4.0)
MCHC: 33 g/dL (ref 30.0–36.0)
MCV: 84.2 fl (ref 78.0–100.0)
Monocytes Absolute: 0.4 10*3/uL (ref 0.1–1.0)
Monocytes Relative: 6.6 % (ref 3.0–12.0)
Neutro Abs: 4.1 10*3/uL (ref 1.4–7.7)
Neutrophils Relative %: 64.5 % (ref 43.0–77.0)
Platelets: 335 10*3/uL (ref 150.0–400.0)
RBC: 4.26 Mil/uL (ref 3.87–5.11)
RDW: 15.9 % — ABNORMAL HIGH (ref 11.5–15.5)
WBC: 6.4 10*3/uL (ref 4.0–10.5)

## 2018-09-20 LAB — VITAMIN B12: Vitamin B-12: >1515 pg/mL — ABNORMAL HIGH (ref 211–911)

## 2018-09-20 LAB — FERRITIN: Ferritin: 132.5 ng/mL (ref 10.0–291.0)

## 2018-11-14 ENCOUNTER — Telehealth: Payer: Self-pay | Admitting: Family Medicine

## 2018-11-14 NOTE — Telephone Encounter (Signed)
Patient has lab appt but there are no orders in the system for labs from Dr Maudie Mercury.  Patient needs a call so she will know whether to fast or not.

## 2018-11-14 NOTE — Telephone Encounter (Signed)
I called the pt and informed her the appt was scheduled prior to Dr Maudie Mercury mentioning to come in for labs in a few weeks versus a few months (see results note from 5/29).  Appt cancelled and the pt is aware.

## 2018-11-15 ENCOUNTER — Other Ambulatory Visit: Payer: 59

## 2018-11-22 ENCOUNTER — Other Ambulatory Visit: Payer: 59

## 2018-11-29 ENCOUNTER — Ambulatory Visit: Payer: 59 | Admitting: Endocrinology

## 2018-12-05 ENCOUNTER — Encounter: Payer: Self-pay | Admitting: Endocrinology

## 2018-12-05 ENCOUNTER — Other Ambulatory Visit: Payer: Self-pay

## 2018-12-05 ENCOUNTER — Telehealth: Payer: Self-pay | Admitting: Endocrinology

## 2018-12-05 ENCOUNTER — Ambulatory Visit (INDEPENDENT_AMBULATORY_CARE_PROVIDER_SITE_OTHER): Payer: 59 | Admitting: Endocrinology

## 2018-12-05 VITALS — Ht 62.0 in

## 2018-12-05 DIAGNOSIS — R739 Hyperglycemia, unspecified: Secondary | ICD-10-CM

## 2018-12-05 MED ORDER — ACARBOSE 50 MG PO TABS: 50.0000 mg | ORAL_TABLET | Freq: Three times a day (TID) | ORAL | 3 refills | Status: DC

## 2018-12-05 NOTE — Telephone Encounter (Signed)
Please resend the acarbose to [facility] for a 90 day supply it was sent to [facility] and that is not what she wanted

## 2018-12-05 NOTE — Progress Notes (Signed)
Subjective:    Patient ID: Alexis [name], female    DOB: Nov 12, 1970, 48 y.o.   MRN: [id number]  HPI telehealth visit today via doxy video visit.  Alternatives to telehealth are presented to this patient, and the patient agrees to the telehealth visit.   Pt is advised of the cost of the visit, and agrees to this, also.   Patient is at home, and I am at the office.   pt returns for f/u of hyperglycemia with reactive hypoglycemia (dx'ed in 2019, when A1c was 6.0% (she had GDM in 1998); she was rx'ed acarbose).  She tolerates acarbose well.  She says chronic constipation persists.  She says cbg varies from 90-120.   Past Medical History:  Diagnosis Date  . Abnormal Pap smear of cervix 04/06/2015   -s/p colpo 03/2015 with gyn with repeat pap with HPV advised in 1 year   . Acne   . Allergy    SEASONAL  . Anemia    menorrhagia  . Anxiety   . Asthma    mild intermittent  . Bronchitis    states gets once a year  . Depression   . Fracture of fourth metatarsal bone of left foot 04/01/2015  . Gallstones    on [place]  . Genital warts   . GERD (gastroesophageal reflux disease)   . Glucose intolerance (impaired glucose tolerance)   . History of echocardiogram    a. Echo 2/17: Mild focal basal septal hypertrophy, EF 55-60%, normal wall motion, grade 1 diastolic dysfunction, trace MR, trace TR  . History of gestational diabetes   . Hyperlipemia   . Irritable bowel syndrome   . Nephrolithiasis 0/53/9767   Renal colic of February 3419 -per prior PCP notes  . Neuroma    in her left foot, has numbness in her toes (on gabapentin)  . 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 Injected 08/24/2015   . Pre-diabetes     Past Surgical History:  Procedure Laterality Date  . CESAREAN SECTION    . cryosurgery cervix    . ENDOMETRIAL ABLATION    . GYNECOLOGIC CRYOSURGERY    . LAPROSCOPIC     After her ablation, she got sick, had a laparoscopy  bowel was attached to her uterus. No bowel was removed. No bowel injury. She did have adhesions of her uterus to her abdominal pain.   . TUBAL LIGATION      Social History   Socioeconomic History  . Marital status: Divorced    Spouse name: Not on file  . Number of children: 2  . Years of education: Not on file  . Highest education level: Not on file  Occupational History  . Occupation: Emergency Services    Comment: [place], [place]  . Financial resource strain: Not on file  . Food insecurity    Worry: Not on file    Inability: Not on file  . Transportation needs    Medical: Not on file    Non-medical: Not on file  Tobacco Use  . Smoking status: Never Smoker  . Smokeless tobacco: Never Used  Substance and Sexual Activity  . Alcohol use: No    Alcohol/week: 0.0 standard drinks  . Drug use: No  . Sexual activity: Not Currently    Partners: Male    Birth control/protection: Surgical    Comment: ablation  Lifestyle  . Physical activity    Days per week: Not on file  Minutes per session: Not on file  . Stress: Not on file  Relationships  . Social Herbalist on phone: Not on file    Gets together: Not on file    Attends religious service: Not on file    Active member of club or organization: Not on file    Attends meetings of clubs or organizations: Not on file    Relationship status: Not on file  . Intimate partner violence    Fear of current or ex partner: Not on file    Emotionally abused: Not on file    Physically abused: Not on file    Forced sexual activity: Not on file  Other Topics Concern  . 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 ACCU-CHEK FASTCLIX LANCETS MISC 1 each by Does not apply route 2 (two) times daily. Use to monitor  glucose levels twice per day; R73.9 102 each 12  . B Complex Vitamins (VITAMIN B COMPLEX PO) Take by mouth.    . Biotin 300 MCG TABS Take 5,000 mcg by mouth daily.     . Blood Glucose Monitoring Suppl (ACCU-CHEK GUIDE ME) w/Device KIT 1 each by Does not apply route 2 (two) times daily. Use to monitor glucose levels twice per day; R73.9 1 kit 0  . cetirizine (ZYRTEC) 10 MG tablet Take 10 mg by mouth daily.    . Cholecalciferol (VITAMIN D3 PO) Take 5,000 Units by mouth.    . clindamycin (CLEOCIN T) 1 % external solution Apply 1 application topically daily.     . clobetasol ointment (TEMOVATE) 0.05 % Apply to affected areas on scalp daily Monday-Friday. Not to face, skin folds.    Lyndle Herrlich SULFATE PO Take by mouth 3 (three) times a week.    . folic acid (FOLVITE) 1 MG tablet Take 1 mg daily by mouth.   3  . glucose blood (ACCU-CHEK GUIDE) test strip Use to monitor glucose levels twice per day; R73.9 100 each 12  . hydrOXYzine (ATARAX/VISTARIL) 10 MG tablet Take 1 tablet (10 mg total) by mouth every 8 (eight) hours as needed for anxiety. 20 tablet 0  . methotrexate (RHEUMATREX) 2.5 MG tablet Take 7.5 mg by mouth 3 (three) times a week.     . Multiple Vitamin (MULTIVITAMIN WITH MINERALS) TABS Take 1 tablet by mouth daily.    . rosuvastatin (CRESTOR) 20 MG tablet Take 1 tablet (20 mg total) by mouth daily. 90 tablet 1  . sertraline (ZOLOFT) 100 MG tablet TAKE 2 TABLETS (=200MG)    DAILY 180 tablet 1  . vitamin B-12 (CYANOCOBALAMIN) 1000 MCG tablet Take 1,000 mcg by mouth daily.     No current facility-administered medications on file prior to visit.     No Known Allergies  Family History  Problem Relation Age of Onset  . Hypertension Mother   . Hypertension Father   . Hyperlipidemia Father   . Diabetes Father   . Cancer Maternal Aunt   . Diabetes Brother   . Heart attack Neg Hx   . Heart failure Neg Hx     Ht 5' 2" (1.575 m)   BMI 29.81 kg/m   Review of Systems She denies  hypoglycemia.      Objective:   Physical  Exam   Lab Results  Component Value Date   HGBA1C 6.2 08/30/2018       Assessment & Plan:  Hyperglycemia: persistent.  Reactive hypoglycemia, better.   Constipation: increasing acarbose will help.   Patient Instructions  I have sent a prescription to your pharmacy, to increase the acarbose.   Please come back for a follow-up appointment in 3-4 months.

## 2018-12-05 NOTE — Telephone Encounter (Signed)
Disp Refills Start End   acarbose (PRECOSE) 50 MG tablet 270 tablet 3 12/05/2018    Sig - Route: Take 1 tablet (50 mg total) by mouth 3 (three) times daily with meals. - Oral   Sent to pharmacy as: acarbose (PRECOSE) 50 MG tablet   E-Prescribing Status: Receipt confirmed by pharmacy (12/05/2018 11:48 AM EDT)

## 2018-12-05 NOTE — Patient Instructions (Addendum)
I have sent a prescription to your pharmacy, to increase the acarbose.   Please come back for a follow-up appointment in 3-4 months.

## 2019-01-07 ENCOUNTER — Ambulatory Visit (INDEPENDENT_AMBULATORY_CARE_PROVIDER_SITE_OTHER): Payer: 59 | Admitting: Family Medicine

## 2019-01-07 ENCOUNTER — Other Ambulatory Visit: Payer: Self-pay

## 2019-01-07 ENCOUNTER — Encounter: Payer: Self-pay | Admitting: Family Medicine

## 2019-01-07 VITALS — Wt 158.0 lb

## 2019-01-07 DIAGNOSIS — F419 Anxiety disorder, unspecified: Secondary | ICD-10-CM

## 2019-01-07 DIAGNOSIS — E538 Deficiency of other specified B group vitamins: Secondary | ICD-10-CM | POA: Diagnosis not present

## 2019-01-07 DIAGNOSIS — R739 Hyperglycemia, unspecified: Secondary | ICD-10-CM | POA: Diagnosis not present

## 2019-01-07 DIAGNOSIS — F3342 Major depressive disorder, recurrent, in full remission: Secondary | ICD-10-CM

## 2019-01-07 DIAGNOSIS — L989 Disorder of the skin and subcutaneous tissue, unspecified: Secondary | ICD-10-CM

## 2019-01-07 NOTE — Progress Notes (Signed)
Virtual Visit via Video Note  I connected with Alexis [name]  on 01/07/19 at 11:00 AM EDT by a video enabled telemedicine application and verified that I am speaking with the correct person using two identifiers.  Location patient: home Location provider:work or home office Persons participating in the virtual visit: patient, provider  I discussed the limitations of evaluation and management by telemedicine and the availability of in person appointments. The patient expressed understanding and agreed to proceed.   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 Due for flu shot and recheck b12 on half dose. Tried hydroxyzine last visit for severe anxiety - reports she is doing better and uses the hydroxyzine rarely. Feels it works well. Weight is doing ok. She has a small hypopigmented macule on the chin with a little dry skin - sees dermatologist, but wonders what this is. She stopped taking her b12, but agrees to restart. Due for flu shot and plans to get at the pharmacy.   ROS: See pertinent positives and negatives per HPI.  Past Medical History:  Diagnosis Date  . Abnormal Pap smear of cervix 04/06/2015   -s/p colpo 03/2015 with gyn with repeat pap with HPV advised in 1 year   . Acne   . Allergy    SEASONAL  . Anemia    menorrhagia  . Anxiety   . Asthma    mild intermittent  . Bronchitis    states gets once a year  . Depression   . Fracture of fourth metatarsal bone of left foot 04/01/2015  . Gallstones    on [place]  . Genital warts   . GERD (gastroesophageal reflux disease)   . Glucose intolerance (impaired glucose tolerance)   . History of echocardiogram    a. Echo 2/17: Mild focal basal septal hypertrophy, EF 55-60%, normal wall motion, grade 1 diastolic dysfunction, trace MR, trace TR  . History  of gestational diabetes   . Hyperlipemia   . Irritable bowel syndrome   . Nephrolithiasis 8/58/8502   Renal colic of February 7741 -per prior PCP notes  . Neuroma    in her left foot, has numbness in her toes (on gabapentin)  . 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 Injected 08/24/2015   . Pre-diabetes     Past Surgical History:  Procedure Laterality Date  . CESAREAN SECTION    . cryosurgery cervix    . ENDOMETRIAL ABLATION    . GYNECOLOGIC CRYOSURGERY    . LAPROSCOPIC     After her ablation, she got sick, had a laparoscopy bowel was attached to her uterus. No bowel was removed. No bowel injury. She did have adhesions of her uterus to her abdominal pain.   . TUBAL LIGATION      Family History  Problem Relation Age of Onset  . Hypertension Mother   . Hypertension Father   . Hyperlipidemia Father   . Diabetes Father   . Cancer Maternal Aunt   . Diabetes Brother   . Heart attack Neg Hx   . Heart failure Neg Hx     SOCIAL HX: see hpi   Current Outpatient Medications:  .  acarbose (PRECOSE) 50 MG tablet, Take 1 tablet (50 mg total) by mouth 3 (three) times daily with meals., Disp: 270 tablet, Rfl: 3 .  ACCU-CHEK  FASTCLIX LANCETS MISC, 1 each by Does not apply route 2 (two) times daily. Use to monitor glucose levels twice per day; R73.9, Disp: 102 each, Rfl: 12 .  APPLE CIDER VINEGAR PO, Take by mouth., Disp: , Rfl:  .  B Complex Vitamins (VITAMIN B COMPLEX PO), Take by mouth., Disp: , Rfl:  .  Biotin 300 MCG TABS, Take 5,000 mcg by mouth daily. , Disp: , Rfl:  .  BLACK CURRANT SEED OIL PO, Take by mouth daily., Disp: , Rfl:  .  Blood Glucose Monitoring Suppl (ACCU-CHEK GUIDE ME) w/Device KIT, 1 each by Does not apply route 2 (two) times daily. Use to monitor glucose levels twice per day; R73.9, Disp: 1 kit, Rfl: 0 .  cetirizine (ZYRTEC) 10 MG tablet, Take 10 mg by mouth daily., Disp: , Rfl:  .  Cholecalciferol (VITAMIN  D3 PO), Take 5,000 Units by mouth., Disp: , Rfl:  .  clindamycin (CLEOCIN T) 1 % external solution, Apply 1 application topically daily. , Disp: , Rfl:  .  clobetasol ointment (TEMOVATE) 0.05 %, Apply to affected areas on scalp daily Monday-Friday. Not to face, skin folds., Disp: , Rfl:  .  FERROUS SULFATE PO, Take by mouth 3 (three) times a week., Disp: , Rfl:  .  folic acid (FOLVITE) 1 MG tablet, Take 1 mg daily by mouth. , Disp: , Rfl: 3 .  glucose blood (ACCU-CHEK GUIDE) test strip, Use to monitor glucose levels twice per day; R73.9, Disp: 100 each, Rfl: 12 .  hydrOXYzine (ATARAX/VISTARIL) 10 MG tablet, Take 1 tablet (10 mg total) by mouth every 8 (eight) hours as needed for anxiety., Disp: 20 tablet, Rfl: 0 .  methotrexate (RHEUMATREX) 2.5 MG tablet, Take 7.5 mg by mouth 3 (three) times a week. , Disp: , Rfl:  .  Multiple Vitamin (MULTIVITAMIN WITH MINERALS) TABS, Take 1 tablet by mouth daily., Disp: , Rfl:  .  rosuvastatin (CRESTOR) 20 MG tablet, Take 1 tablet (20 mg total) by mouth daily., Disp: 90 tablet, Rfl: 1 .  sertraline (ZOLOFT) 100 MG tablet, TAKE 2 TABLETS (=200MG)    DAILY, Disp: 180 tablet, Rfl: 1 .  vitamin B-12 (CYANOCOBALAMIN) 1000 MCG tablet, Take 1,000 mcg by mouth daily., Disp: , Rfl:   EXAM:  VITALS per patient if applicable: Today's Vitals   01/07/19 1024  Weight: 158 lb (71.7 kg)   Body mass index is 28.9 kg/m.   GENERAL: alert, oriented, appears well and in no acute distress  HEENT: atraumatic, conjunttiva clear, no obvious abnormalities on inspection of external nose and ears  NECK: normal movements of the head and neck  LUNGS: on inspection no signs of respiratory distress, breathing rate appears normal, no obvious gross SOB, gasping or wheezing  CV: no obvious cyanosis  MS: moves all visible extremities without noticeable abnormality  SKIN: small hypopigmented area about the size of a dime on the chin, unable to determine fine features over  video  PSYCH/NEURO: pleasant and cooperative, no obvious depression or anxiety, speech and thought processing grossly intact  ASSESSMENT AND PLAN:  Discussed the following assessment and plan:  Hyperglycemia  B12 deficiency  Recurrent major depressive disorder, in full remission ([place])  Anxiety  Skin lesion  -Pt prefers to follow up via telemedicine today given the pandemic. She is doing well and the atarax seems to help with the anxiety. Discussed potential etiologies for hypopigmented skin and she plans to call her dermatologist - will try antifungal in the interim. Plan b12 check  once back on 1/2 dose. Agrees to come in for a CPE in 3 month and to meet Dr. Ethlyn Gallery to [facility]. She plans to get flu shot elsewhere.   I discussed the assessment and treatment plan with the patient. The patient was provided an opportunity to ask questions and all were answered. The patient agreed with the plan and demonstrated an understanding of the instructions.   The patient was advised to call back or seek an in-person evaluation if the symptoms worsen or if the condition fails to improve as anticipated.   Lucretia Kern, DO

## 2019-02-12 ENCOUNTER — Other Ambulatory Visit: Payer: Self-pay

## 2019-02-12 DIAGNOSIS — Z20822 Contact with and (suspected) exposure to covid-19: Secondary | ICD-10-CM

## 2019-02-15 LAB — NOVEL CORONAVIRUS, NAA: SARS-CoV-2, NAA: DETECTED — AB

## 2019-02-17 ENCOUNTER — Ambulatory Visit: Payer: Self-pay | Admitting: *Deleted

## 2019-02-17 NOTE — Telephone Encounter (Signed)
Patient calls after have a televisit on 02/15/19 with Dr. Manson Allan who prescribed her ProAir HFA 90 mch one puff Q4 hours as needed due to upper chest tightness with Covid19. Care Advice including: OTC mucinex with a lot of water, ibuprofen for the strained voice along with warm fluids with honey. Rest voice as much as possible. Call to her pharmacy, they had accidentally placed the prescription on hold. They will have ready for pick up in one hour. Reviewed urgent sxs with Alexis [name] that she should seek trreatment at the ER including worsening or no improvement of her lungs, fever, SOB. Stated she understood all information.  Answer Assessment - Initial Assessment Questions 1. REASON FOR CALL or QUESTION: "What is your reason for calling today?" or "How can I best help you?" or "What question do you have that I can help answer?"     Had a telemedicine visit with [facility] on Saturday, 11/4. Was prescribed ProAir HFA 90 mcg. Pharmacy continues to say in process.  Protocols used: INFORMATION ONLY CALL-A-AH

## 2019-02-18 ENCOUNTER — Ambulatory Visit: Payer: Self-pay

## 2019-02-18 ENCOUNTER — Encounter (HOSPITAL_COMMUNITY): Payer: Self-pay

## 2019-02-18 ENCOUNTER — Emergency Department (HOSPITAL_COMMUNITY)
Admission: EM | Admit: 2019-02-18 | Discharge: 2019-02-18 | Disposition: A | Discharge status: Home / Self Care | Payer: No Typology Code available for payment source | Attending: Emergency Medicine | Admitting: Emergency Medicine

## 2019-02-18 ENCOUNTER — Other Ambulatory Visit: Payer: Self-pay

## 2019-02-18 ENCOUNTER — Emergency Department (HOSPITAL_COMMUNITY): Payer: No Typology Code available for payment source

## 2019-02-18 DIAGNOSIS — R0602 Shortness of breath: Secondary | ICD-10-CM | POA: Diagnosis present

## 2019-02-18 DIAGNOSIS — I10 Essential (primary) hypertension: Secondary | ICD-10-CM | POA: Diagnosis not present

## 2019-02-18 DIAGNOSIS — U071 COVID-19: Secondary | ICD-10-CM | POA: Insufficient documentation

## 2019-02-18 DIAGNOSIS — R072 Precordial pain: Secondary | ICD-10-CM | POA: Insufficient documentation

## 2019-02-18 DIAGNOSIS — J45909 Unspecified asthma, uncomplicated: Secondary | ICD-10-CM | POA: Insufficient documentation

## 2019-02-18 LAB — CBC WITH DIFFERENTIAL/PLATELET
Abs Immature Granulocytes: 0.16 10*3/uL — ABNORMAL HIGH (ref 0.00–0.07)
Basophils Absolute: 0 10*3/uL (ref 0.0–0.1)
Basophils Relative: 0 %
Eosinophils Absolute: 0 10*3/uL (ref 0.0–0.5)
Eosinophils Relative: 0 %
HCT: 39.6 % (ref 36.0–46.0)
Hemoglobin: 12.2 g/dL (ref 12.0–15.0)
Immature Granulocytes: 2 %
Lymphocytes Relative: 17 %
Lymphs Abs: 1.8 10*3/uL (ref 0.7–4.0)
MCH: 27.2 pg (ref 26.0–34.0)
MCHC: 30.8 g/dL (ref 30.0–36.0)
MCV: 88.4 fL (ref 80.0–100.0)
Monocytes Absolute: 0.8 10*3/uL (ref 0.1–1.0)
Monocytes Relative: 8 %
Neutro Abs: 7.5 10*3/uL (ref 1.7–7.7)
Neutrophils Relative %: 73 %
Platelets: 283 10*3/uL (ref 150–400)
RBC: 4.48 MIL/uL (ref 3.87–5.11)
RDW: 14.6 % (ref 11.5–15.5)
WBC: 10.2 10*3/uL (ref 4.0–10.5)
nRBC: 0 % (ref 0.0–0.2)

## 2019-02-18 LAB — COMPREHENSIVE METABOLIC PANEL
ALT: 43 U/L (ref 0–44)
AST: 26 U/L (ref 15–41)
Albumin: 4.4 g/dL (ref 3.5–5.0)
Alkaline Phosphatase: 56 U/L (ref 38–126)
Anion gap: 12 (ref 5–15)
BUN: 15 mg/dL (ref 6–20)
CO2: 26 mmol/L (ref 22–32)
Calcium: 9.3 mg/dL (ref 8.9–10.3)
Chloride: 101 mmol/L (ref 98–111)
Creatinine, Ser: 0.89 mg/dL (ref 0.44–1.00)
GFR calc Af Amer: >60 mL/min (ref >60)
GFR calc non Af Amer: >60 mL/min (ref >60)
Glucose, Bld: 86 mg/dL (ref 70–99)
Potassium: 3.6 mmol/L (ref 3.5–5.1)
Sodium: 139 mmol/L (ref 135–145)
Total Bilirubin: 1 mg/dL (ref 0.3–1.2)
Total Protein: 7.7 g/dL (ref 6.5–8.1)

## 2019-02-18 LAB — TROPONIN I (HIGH SENSITIVITY): Troponin I (High Sensitivity): <2 ng/L (ref <18)

## 2019-02-18 NOTE — ED Provider Notes (Signed)
Emergency Department Provider Note   I have reviewed the triage vital signs and the nursing notes.   HISTORY  Chief Complaint Shortness of Breath, Cough, Fatigue, and Laryngitis   HPI Alexis [name] is a 48 y.o. female with known positive COVID diagnosis from 11/11 presents to the ED with continued exertional dyspnea, cough, loss of voice, and new onset chest pressure. She has had the above symptoms over the last week with no worsening but the chest discomfort is new and primarily prompted her ED visit. She describes this as a "tightness" in the center of the chest which is mostly constant but worse with coughing. No unilateral pain. No radiation of symptoms. No pleuritic pain. She continues to have fever.     Past Medical History:  Diagnosis Date  . Abnormal Pap smear of cervix 04/06/2015   -s/p colpo 03/2015 with gyn with repeat pap with HPV advised in 1 year   . Acne   . Allergy    SEASONAL  . Anemia    menorrhagia  . Anxiety   . Asthma    mild intermittent  . Bronchitis    states gets once a year  . Depression   . Fracture of fourth metatarsal bone of left foot 04/01/2015  . Gallstones    on [place]  . Genital warts   . GERD (gastroesophageal reflux disease)   . Glucose intolerance (impaired glucose tolerance)   . History of echocardiogram    a. Echo 2/17: Mild focal basal septal hypertrophy, EF 55-60%, normal wall motion, grade 1 diastolic dysfunction, trace MR, trace TR  . History of gestational diabetes   . Hyperlipemia   . Irritable bowel syndrome   . Nephrolithiasis [id number]   Renal colic of February 123456 -per prior PCP notes  . Neuroma    in her left foot, has numbness in her toes (on gabapentin)  . 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 Injected 08/24/2015   . Pre-diabetes     Patient Active Problem List   Diagnosis Date Noted  . Hyperglycemia 10/18/2016  . Anemia 10/18/2016  . Obesity  10/18/2016  . Hypertension 10/18/2016  . B12 deficiency 06/08/2016  . PVC's (premature ventricular contractions) 07/06/2015  . IBS (irritable bowel syndrome) 06/17/2015  . Seasonal allergies 06/17/2015  . Abnormal Pap smear of cervix 04/06/2015  . Anxiety   . MDD (major depressive disorder) 03/09/2015  . Pure hypercholesterolemia 09/25/2012  . GERD (gastroesophageal reflux disease) 09/25/2012    Past Surgical History:  Procedure Laterality Date  . CESAREAN SECTION    . cryosurgery cervix    . ENDOMETRIAL ABLATION    . GYNECOLOGIC CRYOSURGERY    . LAPROSCOPIC     After her ablation, she got sick, had a laparoscopy bowel was attached to her uterus. No bowel was removed. No bowel injury. She did have adhesions of her uterus to her abdominal pain.   . TUBAL LIGATION      Allergies Patient has no known allergies.  Family History  Problem Relation Age of Onset  . Hypertension Mother   . Hypertension Father   . Hyperlipidemia Father   . Diabetes Father   . Cancer Maternal Aunt   . Diabetes Brother   . Heart attack Neg Hx   . Heart failure Neg Hx     Social History Social History   Tobacco Use  . Smoking status: Never Smoker  . Smokeless tobacco: Never Used  Substance Use  Topics  . Alcohol use: No    Alcohol/week: 0.0 standard drinks  . Drug use: No    Review of Systems  Constitutional: Positive fever/chills and weakness.  Eyes: No visual changes. ENT: Mild sore throat and congestion. Positive loss of voice.  Cardiovascular: Positive chest pain. Respiratory: Positive shortness of breath and cough.  Gastrointestinal: No abdominal pain.  No nausea, no vomiting.  No diarrhea.  No constipation. Genitourinary: Negative for dysuria. Musculoskeletal: Negative for back pain. Skin: Negative for rash. Neurological: Negative for focal weakness or numbness. Positive HA.   10-point ROS otherwise negative.  ____________________________________________   PHYSICAL EXAM:   VITAL SIGNS: ED Triage Vitals  Enc Vitals Group     BP 02/18/19 1226 127/84     Pulse Rate 02/18/19 1226 80     Resp 02/18/19 1226 18     Temp 02/18/19 1226 98.5 F (36.9 C)     Temp Source 02/18/19 1226 Oral     SpO2 02/18/19 1226 100 %   Constitutional: Alert and oriented. Well appearing and in no acute distress. Eyes: Conjunctivae are normal. Head: Atraumatic. Nose: Mild congestion/rhinnorhea. Mouth/Throat: Mucous membranes are moist.  Oropharynx non-erythematous. No PTA. No trismus.  Neck: No stridor.  Cardiovascular: Normal rate, regular rhythm. Good peripheral circulation. Grossly normal heart sounds.   Respiratory: Normal respiratory effort.  No retractions. Lungs CTAB. Gastrointestinal: No distention.  Musculoskeletal: No gross deformities of extremities. Neurologic:  Normal speech and language. Skin:  Skin is warm, dry and intact. No rash noted.  ____________________________________________   LABS (all labs ordered are listed, but only abnormal results are displayed)  Labs Reviewed  CBC WITH DIFFERENTIAL/PLATELET - Abnormal; Notable for the following components:      Result Value   Abs Immature Granulocytes 0.16 (*)    All other components within normal limits  COMPREHENSIVE METABOLIC PANEL  TROPONIN I (HIGH SENSITIVITY)   ____________________________________________  EKG   EKG Interpretation  Date/Time:  Tuesday February 18 2019 12:27:07 EST Ventricular Rate:  78 PR Interval:    QRS Duration: 79 QT Interval:  362 QTC Calculation: 413 R Axis:   23 Text Interpretation: Sinus rhythm Abnormal R-wave progression, early transition No STEMI Confirmed by Nanda Quinton [id number]) on 02/18/2019 12:38:37 PM       ____________________________________________  RADIOLOGY  Dg Chest Portable 1 View  Result Date: 02/18/2019 CLINICAL DATA:  Shortness of breath and chest tightness. Cough and weakness. EXAM: PORTABLE CHEST 1 VIEW COMPARISON:  04/30/2015 FINDINGS:  Heart size is normal. Mediastinal shadows are normal. Question mild central bronchial thickening that could go along with bronchitis. No infiltrate, collapse or effusion. IMPRESSION: Question bronchitis.  No consolidation or collapse. Electronically Signed   By: Nelson Chimes M.D.   On: 02/18/2019 13:15    ____________________________________________   PROCEDURES  Procedure(s) performed:   Procedures  None ____________________________________________   INITIAL IMPRESSION / ASSESSMENT AND PLAN / ED COURSE  Pertinent labs & imaging results that were available during my care of the patient were reviewed by me and considered in my medical decision making (see chart for details).   Patient with known COVID diagnosis presents to the ED with chest tightness in additional to other COVID-like symptoms. No hypoxemia here either at rest or with ambulation. No vomiting. Patient is well-appearing and in no respiratory distress. EKG interpreted as above. CXR and labs reviewed. 97% on RA with ambulation as documented. Plan for continued supportive care at home, isolation, and PCP follow up. Discussed ED return precautions.  Alexis [name] was evaluated in Emergency Department on 02/18/2019 for the symptoms described in the history of present illness. She was evaluated in the context of the global COVID-19 pandemic, which necessitated consideration that the patient might be at risk for infection with the SARS-CoV-2 virus that causes COVID-19. Institutional protocols and algorithms that pertain to the evaluation of patients at risk for COVID-19 are in a state of rapid change based on information released by regulatory bodies including the CDC and federal and state organizations. These policies and algorithms were followed during the patient's care in the ED.    ____________________________________________  FINAL CLINICAL IMPRESSION(S) / ED DIAGNOSES  Final diagnoses:  COVID-19  Precordial chest  pain    Note:  This document was prepared using Dragon voice recognition software and may include unintentional dictation errors.  Nanda Quinton, MD, [facility] Emergency Medicine    Omarii Scalzo, [facility], MD 02/18/19 [phone]

## 2019-02-18 NOTE — Discharge Instructions (Signed)
Person Under Monitoring Name: Alexis [name]  Location: [place] [address]   Infection Prevention Recommendations for Individuals Confirmed to have, or Being Evaluated for, 2019 Novel Coronavirus (COVID-19) Infection Who Receive Care at Home  Individuals who are confirmed to have, or are being evaluated for, COVID-19 should follow the prevention steps below until a healthcare provider or local or state health department says they can return to normal activities.  Stay home except to get medical care You should restrict activities outside your home, except for getting medical care. Do not go to work, school, or public areas, and do not use public transportation or taxis.  Call ahead before visiting your doctor Before your medical appointment, call the healthcare provider and tell them that you have, or are being evaluated for, COVID-19 infection. This will help the healthcare providers office take steps to keep other people from getting infected. Ask your healthcare provider to call the local or state health department.  Monitor your symptoms Seek prompt medical attention if your illness is worsening (e.g., difficulty breathing). Before going to your medical appointment, call the healthcare provider and tell them that you have, or are being evaluated for, COVID-19 infection. Ask your healthcare provider to call the local or state health department.  Wear a facemask You should wear a facemask that covers your nose and mouth when you are in the same room with other people and when you visit a healthcare provider. People who live with or visit you should also wear a facemask while they are in the same room with you.  Separate yourself from other people in your home As much as possible, you should stay in a different room from other people in your home. Also, you should use a separate bathroom, if available.  Avoid sharing household items You should  not share dishes, drinking glasses, cups, eating utensils, towels, bedding, or other items with other people in your home. After using these items, you should wash them thoroughly with soap and water.  Cover your coughs and sneezes Cover your mouth and nose with a tissue when you cough or sneeze, or you can cough or sneeze into your sleeve. Throw used tissues in a lined trash can, and immediately wash your hands with soap and water for at least 20 seconds or use an alcohol-based hand rub.  Wash your [facility] your hands often and thoroughly with soap and water for at least 20 seconds. You can use an alcohol-based hand sanitizer if soap and water are not available and if your hands are not visibly dirty. Avoid touching your eyes, nose, and mouth with unwashed hands.   Prevention Steps for Caregivers and Household Members of Individuals Confirmed to have, or Being Evaluated for, COVID-19 Infection Being Cared for in the Home  If you live with, or provide care at home for, a person confirmed to have, or being evaluated for, COVID-19 infection please follow these guidelines to prevent infection:  Follow healthcare providers instructions Make sure that you understand and can help the patient follow any healthcare provider instructions for all care.  Provide for the patients basic needs You should help the patient with basic needs in the home and provide support for getting groceries, prescriptions, and other personal needs.  Monitor the patients symptoms If they are getting sicker, call his or her medical provider and tell them that the patient has, or is being evaluated for, COVID-19 infection. This will help the healthcare providers office  take steps to keep other people from getting infected. Ask the healthcare provider to call the local or state health department.  Limit the number of people who have contact with the patient If possible, have only one caregiver for the  patient. Other household members should stay in another home or place of residence. If this is not possible, they should stay in another room, or be separated from the patient as much as possible. Use a separate bathroom, if available. Restrict visitors who do not have an essential need to be in the home.  Keep older adults, very young children, and other sick people away from the patient Keep older adults, very young children, and those who have compromised immune systems or chronic health conditions away from the patient. This includes people with chronic heart, lung, or kidney conditions, diabetes, and cancer.  Ensure good ventilation Make sure that shared spaces in the home have good air flow, such as from an air conditioner or an opened window, weather permitting.  Wash your hands often Wash your hands often and thoroughly with soap and water for at least 20 seconds. You can use an alcohol based hand sanitizer if soap and water are not available and if your hands are not visibly dirty. Avoid touching your eyes, nose, and mouth with unwashed hands. Use disposable paper towels to dry your hands. If not available, use dedicated cloth towels and replace them when they become wet.  Wear a facemask and gloves Wear a disposable facemask at all times in the room and gloves when you touch or have contact with the patients blood, body fluids, and/or secretions or excretions, such as sweat, saliva, sputum, nasal mucus, vomit, urine, or feces.  Ensure the mask fits over your nose and mouth tightly, and do not touch it during use. Throw out disposable facemasks and gloves after using them. Do not reuse. Wash your hands immediately after removing your facemask and gloves. If your personal clothing becomes contaminated, carefully remove clothing and launder. Wash your hands after handling contaminated clothing. Place all used disposable facemasks, gloves, and other waste in a lined container before  disposing them with other household waste. Remove gloves and wash your hands immediately after handling these items.  Do not share dishes, glasses, or other household items with the patient Avoid sharing household items. You should not share dishes, drinking glasses, cups, eating utensils, towels, bedding, or other items with a patient who is confirmed to have, or being evaluated for, COVID-19 infection. After the person uses these items, you should wash them thoroughly with soap and water.  Wash laundry thoroughly Immediately remove and wash clothes or bedding that have blood, body fluids, and/or secretions or excretions, such as sweat, saliva, sputum, nasal mucus, vomit, urine, or feces, on them. Wear gloves when handling laundry from the patient. Read and follow directions on labels of laundry or clothing items and detergent. In general, wash and dry with the warmest temperatures recommended on the label.  Clean all areas the individual has used often Clean all touchable surfaces, such as counters, tabletops, doorknobs, bathroom fixtures, toilets, phones, keyboards, tablets, and bedside tables, every day. Also, clean any surfaces that may have blood, body fluids, and/or secretions or excretions on them. Wear gloves when cleaning surfaces the patient has come in contact with. Use a diluted bleach solution (e.g., dilute bleach with 1 part bleach and 10 parts water) or a household disinfectant with a label that says EPA-registered for coronaviruses. To make a bleach  solution at home, add 1 tablespoon of bleach to 1 quart (4 cups) of water. For a larger supply, add  cup of bleach to 1 gallon (16 cups) of water. Read labels of cleaning products and follow recommendations provided on product labels. Labels contain instructions for safe and effective use of the cleaning product including precautions you should take when applying the product, such as wearing gloves or eye protection and making sure you  have good ventilation during use of the product. Remove gloves and wash hands immediately after cleaning.  Monitor yourself for signs and symptoms of illness Caregivers and household members are considered close contacts, should monitor their health, and will be asked to limit movement outside of the home to the extent possible. Follow the monitoring steps for close contacts listed on the symptom monitoring form.   ? If you have additional questions, contact your local health department or call the epidemiologist on call at [phone] (available 24/7). ? This guidance is subject to change. For the most up-to-date guidance from [facility], please refer to their website: [email or web address]

## 2019-02-18 NOTE — Telephone Encounter (Signed)
Pt. Called to report worsening of feeling light-headed and more fatigued, since she spoke with nurse yesterday.  Positive for COVID 19 on 11/11; onset of symptoms 11/5 with report of steady worsening of symptoms.  C/o chest pressure; rated at 6-7/10.  Reported shortness of breath when she talks.  Temp. Has been low grade; last temp. Was down to 98.7 @ 9:00 AM today.  Reported dry cough.  Pt. Has layngitis; reported this has not improved since onset of symptoms on 11/5.  C/o dry mouth.  Denied noting concentrated urine.  Denied sore throat.  Sounds weak to Triage nurse.  Advised due to increased weakness, fatigue, light-headedness, and chest pressure, to go to ER for evaluation.  Advised to call ahead to make aware she is coming to [facility].   Advised to wear mask, and that she will not be able to have anyone sit in ER with her, due to COVID restrictions.  Verb. Understanding.  Agreed to go to ER.    Reason for Disposition . SEVERE or constant chest pain or pressure (Exception: mild central chest pain, present only when coughing)  Answer Assessment - Initial Assessment Questions 1. COVID-19 DIAGNOSIS: "Who made your Coronavirus (COVID-19) diagnosis?" "Was it confirmed by a positive lab test?" If not diagnosed by a HCP, ask "Are there lots of cases (community spread) where you live?" (See public health department website, if unsure)     Pt. Tested COVID positive 11/11 2. ONSET: "When did the COVID-19 symptoms start?"      11/5 3. WORST SYMPTOM: "What is your worst symptom?" (e.g., cough, fever, shortness of breath, muscle aches)     Feeling light headed and increased fatigue.  4. COUGH: "Do you have a cough?" If so, ask: "How bad is the cough?"       Dry cough  5. FEVER: "Do you have a fever?" If so, ask: "What is your temperature, how was it measured, and when did it start?"    98.7 @ 9:00 AM  6. RESPIRATORY STATUS: "Describe your breathing?" (e.g., shortness of breath, wheezing, unable to  speak)      Some shortness of breath with talking  7. BETTER-SAME-WORSE: "Are you getting better, staying the same or getting worse compared to yesterday?"  If getting worse, ask, "In what way?"     More tired, light headed 8. HIGH RISK DISEASE: "Do you have any chronic medical problems?" (e.g., asthma, heart or lung disease, weak immune system, etc.)     Pre-diabetic, high cholesterol, Alopecia 9. PREGNANCY: "Is there any chance you are pregnant?" "When was your last menstrual period?"     LMP/ has hx tubal ligation  10. OTHER SYMPTOMS: "Do you have any other symptoms?"  (e.g., chills, fatigue, headache, loss of smell or taste, muscle pain, sore throat)       C/o chest pressure at 6-7/10; dry cough; laryngitis. Denied sore throat.  Protocols used: CORONAVIRUS (COVID-19) DIAGNOSED OR SUSPECTED-A-AH

## 2019-02-18 NOTE — ED Notes (Signed)
Pt Sp02 97 RA while ambulating,

## 2019-02-18 NOTE — ED Triage Notes (Signed)
Pt states having SOB, dry cough, weakness and laryngitis x 2 weeks. Tested Positive Covid 11/11.

## 2019-02-19 ENCOUNTER — Telehealth (INDEPENDENT_AMBULATORY_CARE_PROVIDER_SITE_OTHER): Payer: 59 | Admitting: Family Medicine

## 2019-02-19 DIAGNOSIS — U071 COVID-19: Secondary | ICD-10-CM | POA: Diagnosis not present

## 2019-02-19 NOTE — Progress Notes (Signed)
Virtual Visit via Video Note  I connected with Alexis [name] on 02/19/19 at  2:30 PM EST by a video enabled telemedicine application 2/2 [id number] pandemic and verified that I am speaking with the correct person using two identifiers.  Location patient: home Location provider:work or home office Persons participating in the virtual visit: patient, provider  I discussed the limitations of evaluation and management by telemedicine and the availability of in person appointments. The patient expressed understanding and agreed to proceed.   HPI: Pt seen via evisit x 2, given prednisone and and inhaler.  Pt seen in ED yesterday for SOB where she tested positive for COVID. Pt states symptoms started Nov 5.  Pt with dry mouth, HA, ear pressure, throat irritation, dry cough, lightheadedness, slightly decreased appetite, SOB pO2 93-34% on home pulse ox.  Denies fever, rhinorrhea, diarrhea, loss of taste or smell, n/v.   Taking tylenol prn.  ROS: See pertinent positives and negatives per HPI.  Past Medical History:  Diagnosis Date  . Abnormal Pap smear of cervix 04/06/2015   -s/p colpo 03/2015 with gyn with repeat pap with HPV advised in 1 year   . Acne   . Allergy    SEASONAL  . Anemia    menorrhagia  . Anxiety   . Asthma    mild intermittent  . Bronchitis    states gets once a year  . Depression   . Fracture of fourth metatarsal bone of left foot 04/01/2015  . Gallstones    on [place]  . Genital warts   . GERD (gastroesophageal reflux disease)   . Glucose intolerance (impaired glucose tolerance)   . History of echocardiogram    a. Echo 2/17: Mild focal basal septal hypertrophy, EF 55-60%, normal wall motion, grade 1 diastolic dysfunction, trace MR, trace TR  . History of gestational diabetes   . Hyperlipemia   . Irritable bowel syndrome   . Nephrolithiasis 9/81/1914   Renal colic of February 7829 -per prior PCP notes  . Neuroma    in her left foot, has numbness in her toes (on  gabapentin)  . 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 Injected 08/24/2015   . Pre-diabetes     Past Surgical History:  Procedure Laterality Date  . CESAREAN SECTION    . cryosurgery cervix    . ENDOMETRIAL ABLATION    . GYNECOLOGIC CRYOSURGERY    . LAPROSCOPIC     After her ablation, she got sick, had a laparoscopy bowel was attached to her uterus. No bowel was removed. No bowel injury. She did have adhesions of her uterus to her abdominal pain.   . TUBAL LIGATION      Family History  Problem Relation Age of Onset  . Hypertension Mother   . Hypertension Father   . Hyperlipidemia Father   . Diabetes Father   . Cancer Maternal Aunt   . Diabetes Brother   . Heart attack Neg Hx   . Heart failure Neg Hx      Current Outpatient Medications:  .  acarbose (PRECOSE) 50 MG tablet, Take 1 tablet (50 mg total) by mouth 3 (three) times daily with meals., Disp: 270 tablet, Rfl: 3 .  ACCU-CHEK FASTCLIX LANCETS MISC, 1 each by Does not apply route 2 (two) times daily. Use to monitor glucose levels twice per day; R73.9, Disp: 102 each, Rfl: 12 .  APPLE CIDER VINEGAR PO, Take by mouth., Disp: , Rfl:  .  B Complex Vitamins (VITAMIN B COMPLEX PO), Take by mouth., Disp: , Rfl:  .  Biotin 300 MCG TABS, Take 5,000 mcg by mouth daily. , Disp: , Rfl:  .  BLACK CURRANT SEED OIL PO, Take by mouth daily., Disp: , Rfl:  .  Blood Glucose Monitoring Suppl (ACCU-CHEK GUIDE ME) w/Device KIT, 1 each by Does not apply route 2 (two) times daily. Use to monitor glucose levels twice per day; R73.9, Disp: 1 kit, Rfl: 0 .  cetirizine (ZYRTEC) 10 MG tablet, Take 10 mg by mouth daily., Disp: , Rfl:  .  Cholecalciferol (VITAMIN D3 PO), Take 5,000 Units by mouth., Disp: , Rfl:  .  clindamycin (CLEOCIN T) 1 % external solution, Apply 1 application topically daily. , Disp: , Rfl:  .  clobetasol ointment (TEMOVATE) 0.05 %, Apply to affected areas on scalp daily  Monday-Friday. Not to face, skin folds., Disp: , Rfl:  .  FERROUS SULFATE PO, Take by mouth 3 (three) times a week., Disp: , Rfl:  .  folic acid (FOLVITE) 1 MG tablet, Take 1 mg daily by mouth. , Disp: , Rfl: 3 .  glucose blood (ACCU-CHEK GUIDE) test strip, Use to monitor glucose levels twice per day; R73.9, Disp: 100 each, Rfl: 12 .  hydrOXYzine (ATARAX/VISTARIL) 10 MG tablet, Take 1 tablet (10 mg total) by mouth every 8 (eight) hours as needed for anxiety., Disp: 20 tablet, Rfl: 0 .  methotrexate (RHEUMATREX) 2.5 MG tablet, Take 7.5 mg by mouth 3 (three) times a week. , Disp: , Rfl:  .  Multiple Vitamin (MULTIVITAMIN WITH MINERALS) TABS, Take 1 tablet by mouth daily., Disp: , Rfl:  .  rosuvastatin (CRESTOR) 20 MG tablet, Take 1 tablet (20 mg total) by mouth daily., Disp: 90 tablet, Rfl: 1 .  sertraline (ZOLOFT) 100 MG tablet, TAKE 2 TABLETS (=200MG)    DAILY, Disp: 180 tablet, Rfl: 1 .  vitamin B-12 (CYANOCOBALAMIN) 1000 MCG tablet, Take 1,000 mcg by mouth daily., Disp: , Rfl:   EXAM:  VITALS per patient if applicable: pO2 34-74%, RR between 12-20 bpm  GENERAL: alert, oriented, appears sick but nontoxic in no acute distress  HEENT: MM dry,  atraumatic, conjunctiva clear, no obvious abnormalities on inspection of external nose and ears.  Voice hoarse.  NECK: normal movements of the head and neck  LUNGS: on inspection no signs of respiratory distress, breathing rate appears normal, no obvious gross SOB, gasping or wheezing  CV: no obvious cyanosis  MS: moves all visible extremities without noticeable abnormality  PSYCH/NEURO: pleasant and cooperative, no obvious depression or anxiety, speech and thought processing grossly intact  ASSESSMENT AND PLAN:  Discussed the following assessment and plan:  COVID-19 virus infection  -discussed supportive care -continue qurantine -hydration strongly encouraged.  Pt to increase intake of fluids.  Consider drinking gatorade, pedialyte or other  sports drink.   -advised to go to the ED for worsening SOB or inability to stay hydrated.  Pt hesitant, but expresses understanding.  F/u prn    I discussed the assessment and treatment plan with the patient. The patient was provided an opportunity to ask questions and all were answered. The patient agreed with the plan and demonstrated an understanding of the instructions.   The patient was advised to call back or seek an in-person evaluation if the symptoms worsen or if the condition fails to improve as anticipated.  Billie Ruddy, MD

## 2019-03-21 ENCOUNTER — Telehealth: Payer: Self-pay | Admitting: Family Medicine

## 2019-03-21 NOTE — Telephone Encounter (Signed)
Patient is calling to ask advice from Dr. Volanda Napoleon. Patient saw Dr. Volanda Napoleon virtually and has had laryngtisi since February 06, 2019. Patient has been drinking tea and taking tylenol, and using cough drops. But her voice still has not returned. Please [phone]. Patient states that it is affecting her job. 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

## 2019-03-21 NOTE — Telephone Encounter (Signed)
Message Routed to PCP CMA 

## 2019-03-24 NOTE — Telephone Encounter (Signed)
Please advise 

## 2019-03-24 NOTE — Telephone Encounter (Signed)
Pt seen only for acute visit.  She has yet to est care with a new provider.  If pt is still having issues related to COVID-19 infection last month she should be re-evaluated in person.

## 2019-03-26 NOTE — Telephone Encounter (Signed)
Spoke with pt states that she will schedule a virtual with Dr Maudie Mercury for her laryngitis and pt states that she will contact the office to schedule transfer of care with Dr Ulice Brilliant.

## 2019-04-13 ENCOUNTER — Other Ambulatory Visit: Payer: Self-pay | Admitting: Family Medicine

## 2019-05-05 ENCOUNTER — Other Ambulatory Visit: Payer: Self-pay | Admitting: Endocrinology

## 2019-05-05 DIAGNOSIS — R739 Hyperglycemia, unspecified: Secondary | ICD-10-CM

## 2019-06-20 ENCOUNTER — Ambulatory Visit: Payer: 59 | Attending: Internal Medicine

## 2019-06-26 ENCOUNTER — Ambulatory Visit: Payer: 59 | Attending: Internal Medicine

## 2019-06-26 DIAGNOSIS — Z23 Encounter for immunization: Secondary | ICD-10-CM

## 2019-06-26 NOTE — Progress Notes (Signed)
   Covid-19 Vaccination Clinic  Name:  Alexis [name]    MRN: [id number] DOB: [birth year]  06/26/2019  [name] was observed post Covid-19 immunization for 15 minutes without incident. She was provided with Vaccine Information Sheet and instruction to access the V-Safe system.   [name] was instructed to call 911 with any severe reactions post vaccine: Marland Kitchen Difficulty breathing  . Swelling of face and throat  . A fast heartbeat  . A bad rash all over body  . Dizziness and weakness   Immunizations Administered    Name Date Dose VIS Date Route   Pfizer COVID-19 Vaccine 06/26/2019  1:42 PM 0.3 mL 03/14/2019 Intramuscular   Manufacturer: [place]   Lot: [id number]   [place]: [id number]

## 2019-07-10 ENCOUNTER — Other Ambulatory Visit: Payer: Self-pay | Admitting: Family Medicine

## 2019-07-21 ENCOUNTER — Ambulatory Visit: Payer: 59 | Attending: Internal Medicine

## 2019-07-21 DIAGNOSIS — Z23 Encounter for immunization: Secondary | ICD-10-CM

## 2019-07-21 NOTE — Progress Notes (Signed)
   Covid-19 Vaccination Clinic  Name:  Alexis [name]    MRN: [id number] DOB: 05-06-1970  07/21/2019  Alexis [name] was observed post Covid-19 immunization for 15 minutes without incident. She was provided with Vaccine Information Sheet and instruction to access the V-Safe system.   Alexis [name] was instructed to call 911 with any severe reactions post vaccine: Marland Kitchen Difficulty breathing  . Swelling of face and throat  . A fast heartbeat  . A bad rash all over body  . Dizziness and weakness   Immunizations Administered    Name Date Dose VIS Date Route   Pfizer COVID-19 Vaccine 07/21/2019 12:59 PM 0.3 mL 05/28/2018 Intramuscular   Manufacturer: [place]   Lot: [id number]   [place]: [id number]

## 2019-07-31 LAB — HM DIABETES EYE EXAM

## 2019-08-05 ENCOUNTER — Encounter: Payer: Self-pay | Admitting: Family Medicine

## 2019-08-05 ENCOUNTER — Telehealth (INDEPENDENT_AMBULATORY_CARE_PROVIDER_SITE_OTHER): Payer: 59 | Admitting: Family Medicine

## 2019-08-05 VITALS — Wt 156.6 lb

## 2019-08-05 DIAGNOSIS — F419 Anxiety disorder, unspecified: Secondary | ICD-10-CM | POA: Diagnosis not present

## 2019-08-05 DIAGNOSIS — F439 Reaction to severe stress, unspecified: Secondary | ICD-10-CM | POA: Diagnosis not present

## 2019-08-05 NOTE — Progress Notes (Signed)
Virtual Visit via Video Note  I connected with Alexis [name]  on 08/05/19 at 12:40 PM EDT by a video enabled telemedicine application and verified that I am speaking with the correct person using two identifiers.  Location patient: [place] Location provider:work or home office Persons participating in the virtual visit: patient, provider  I discussed the limitations of evaluation and management by telemedicine and the availability of in person appointments. The patient expressed understanding and agreed to proceed.   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 Does not want to change up the Zoloft right now as feels changing meds has not worked well in the past. She is not requiring the hydroxizine and declines refills -denies SI, thoughts of harm, hallucination, panic attacks -feels like she wants to leave her job, but can not afford to do that right now -she is interested in seeing psychiatry   ROS: See pertinent positives and negatives per HPI.  Past Medical History:  Diagnosis Date  . Abnormal Pap smear of cervix 04/06/2015   -s/p colpo 03/2015 with gyn with repeat pap with HPV advised in 1 year   . Acne   . Allergy    SEASONAL  . Anemia    menorrhagia  . Anxiety   . Asthma    mild intermittent  . Bronchitis    states gets once a year  . Depression   . Fracture of fourth metatarsal bone of left foot 04/01/2015  . Gallstones    on [place]  .  Genital warts   . GERD (gastroesophageal reflux disease)   . Glucose intolerance (impaired glucose tolerance)   . History of echocardiogram    a. Echo 2/17: Mild focal basal septal hypertrophy, EF 55-60%, normal wall motion, grade 1 diastolic dysfunction, trace MR, trace TR  . History of gestational diabetes   . Hyperlipemia   . Irritable bowel syndrome   . Nephrolithiasis 5/97/4163   Renal colic of February 8453 -per prior PCP notes  . Neuroma    in her left foot, has numbness in her toes (on gabapentin)  . 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 Injected 08/24/2015   . Pre-diabetes     Past Surgical History:  Procedure Laterality Date  . CESAREAN SECTION    . cryosurgery cervix    . ENDOMETRIAL ABLATION    . GYNECOLOGIC CRYOSURGERY    . LAPROSCOPIC     After her ablation, she got sick, had a laparoscopy bowel was attached to her uterus. No bowel was removed. No bowel injury. She did have adhesions of her uterus to her abdominal pain.   . TUBAL LIGATION      Family History  Problem Relation Age of Onset  . Hypertension Mother   . Hypertension Father   . Hyperlipidemia Father   . Diabetes Father   . Cancer Maternal Aunt   . Diabetes Brother   .  Heart attack Neg Hx   . Heart failure Neg Hx     SOCIAL HX: see hpi   Current Outpatient Medications:  .  acarbose (PRECOSE) 50 MG tablet, Take 1 tablet (50 mg total) by mouth 3 (three) times daily with meals., Disp: 270 tablet, Rfl: 3 .  ACCU-CHEK FASTCLIX LANCETS MISC, 1 each by Does not apply route 2 (two) times daily. Use to monitor glucose levels twice per day; R73.9, Disp: 102 each, Rfl: 12 .  APPLE CIDER VINEGAR PO, Take by mouth., Disp: , Rfl:  .  B Complex Vitamins (VITAMIN B COMPLEX PO), Take by mouth., Disp: , Rfl:  .  Biotin 300 MCG TABS, Take 5,000 mcg by mouth daily. , Disp: , Rfl:  .  BLACK CURRANT SEED OIL PO, Take by mouth daily., Disp: , Rfl:  .  Blood  Glucose Monitoring Suppl (ACCU-CHEK GUIDE ME) w/Device KIT, 1 each by Does not apply route 2 (two) times daily. Use to monitor glucose levels twice per day; R73.9, Disp: 1 kit, Rfl: 0 .  cetirizine (ZYRTEC) 10 MG tablet, Take 10 mg by mouth daily., Disp: , Rfl:  .  Cholecalciferol (VITAMIN D3 PO), Take 5,000 Units by mouth., Disp: , Rfl:  .  clindamycin (CLEOCIN T) 1 % external solution, Apply 1 application topically daily. , Disp: , Rfl:  .  clobetasol ointment (TEMOVATE) 0.05 %, Apply to affected areas on scalp daily Monday-Friday. Not to face, skin folds., Disp: , Rfl:  .  FERROUS SULFATE PO, Take by mouth 3 (three) times a week., Disp: , Rfl:  .  folic acid (FOLVITE) 1 MG tablet, Take 1 mg daily by mouth. , Disp: , Rfl: 3 .  glucose blood (ACCU-CHEK GUIDE) test strip, Use to monitor glucose levels twice per day; R73.9, Disp: 100 each, Rfl: 12 .  hydrOXYzine (ATARAX/VISTARIL) 10 MG tablet, Take 1 tablet (10 mg total) by mouth every 8 (eight) hours as needed for anxiety., Disp: 20 tablet, Rfl: 0 .  methotrexate (RHEUMATREX) 2.5 MG tablet, Take 7.5 mg by mouth 3 (three) times a week. , Disp: , Rfl:  .  Multiple Vitamin (MULTIVITAMIN WITH MINERALS) TABS, Take 1 tablet by mouth daily., Disp: , Rfl:  .  rosuvastatin (CRESTOR) 20 MG tablet, TAKE 1 TABLET DAILY, Disp: 90 tablet, Rfl: 0 .  sertraline (ZOLOFT) 100 MG tablet, TAKE 2 TABLETS DAILY, Disp: 180 tablet, Rfl: 0 .  vitamin B-12 (CYANOCOBALAMIN) 1000 MCG tablet, Take 1,000 mcg by mouth daily., Disp: , Rfl:   EXAM:  VITALS per patient if applicable:  GENERAL: alert, oriented, appears well and in no acute distress  HEENT: atraumatic, conjunttiva clear, no obvious abnormalities on inspection of external nose and ears  NECK: normal movements of the head and neck  LUNGS: on inspection no signs of respiratory distress, breathing rate appears normal, no obvious gross SOB, gasping or wheezing  CV: no obvious cyanosis  MS: moves all visible  extremities without noticeable abnormality  PSYCH/NEURO: pleasant and cooperative, no obvious depression or anxiety, speech and thought processing grossly intact  ASSESSMENT AND PLAN:  Discussed the following assessment and plan:  Anxiety  Stress  -we discussed possible serious and likely etiologies, options for evaluation and workup, limitations of telemedicine visit vs in person visit, treatment, treatment risks and precautions. Pt prefers to treat via telemedicine empirically rather then risking or undertaking an in person visit at this moment. Discussed options. She wants to see a psychiatrist. Discussed options and provided phone numbers for  [facility] and [facility]. She agrees to call to schedule appt. She is reluctant to change medications, but wants to get psychiatry thoughts. Declines change or refills today. Advised continued regular CBT in the interim. Patient agrees to seek prompt follow up with telemedicine or in person care if worsening, new symptoms arise, or if is not improving with treatment. She has new PCP appointment with Dr. Ethlyn Gallery in a few weeks.   I discussed the assessment and treatment plan with the patient. The patient was provided an opportunity to ask questions and all were answered. The patient agreed with the plan and demonstrated an understanding of the instructions.   The patient was advised to call back or seek an in-person evaluation if the symptoms worsen or if the condition fails to improve as anticipated.   Lucretia Kern, DO

## 2019-08-21 ENCOUNTER — Ambulatory Visit (INDEPENDENT_AMBULATORY_CARE_PROVIDER_SITE_OTHER): Payer: No Typology Code available for payment source | Admitting: Otolaryngology

## 2019-08-21 ENCOUNTER — Other Ambulatory Visit: Payer: Self-pay

## 2019-08-21 ENCOUNTER — Other Ambulatory Visit (INDEPENDENT_AMBULATORY_CARE_PROVIDER_SITE_OTHER): Payer: Self-pay

## 2019-08-21 DIAGNOSIS — R490 Dysphonia: Secondary | ICD-10-CM | POA: Diagnosis not present

## 2019-08-21 DIAGNOSIS — J310 Chronic rhinitis: Secondary | ICD-10-CM | POA: Diagnosis not present

## 2019-08-21 NOTE — Progress Notes (Signed)
HPI: Alexis [name] is a 49 y.o. female who presents for evaluation of hoarseness.  This initially began when she developed Covid back in November.  This was associated with some fatigue and shortness of breath but no real cough or fever.  No loss of sense of smell or taste.  She has had intermittent hoarseness this and losing her voice intermittently since that time.  Sometimes the voice clears up but then other times she has difficulty talking.  In the office today she has a moderately hoarse voice with a weak almost whispering voice. She does have history of allergies with mild nasal congestion and postnasal drainage which is generally clear.  She does not smoke. She works as a Education officer, museum.  Past Medical History:  Diagnosis Date  . Abnormal Pap smear of cervix 04/06/2015   -s/p colpo 03/2015 with gyn with repeat pap with HPV advised in 1 year   . Acne   . Allergy    SEASONAL  . Anemia    menorrhagia  . Anxiety   . Asthma    mild intermittent  . Bronchitis    states gets once a year  . Depression   . Fracture of fourth metatarsal bone of left foot 04/01/2015  . Gallstones    on [place]  . Genital warts   . GERD (gastroesophageal reflux disease)   . Glucose intolerance (impaired glucose tolerance)   . History of echocardiogram    a. Echo 2/17: Mild focal basal septal hypertrophy, EF 55-60%, normal wall motion, grade 1 diastolic dysfunction, trace MR, trace TR  . History of gestational diabetes   . Hyperlipemia   . Irritable bowel syndrome   . Nephrolithiasis 7/61/9509   Renal colic of February 3267 -per prior PCP notes  . Neuroma    in her left foot, has numbness in her toes (on gabapentin)  . 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 Injected 08/24/2015   . Pre-diabetes    Past Surgical History:  Procedure Laterality Date  . CESAREAN SECTION    . cryosurgery cervix    . ENDOMETRIAL ABLATION    . GYNECOLOGIC  CRYOSURGERY    . LAPROSCOPIC     After her ablation, she got sick, had a laparoscopy bowel was attached to her uterus. No bowel was removed. No bowel injury. She did have adhesions of her uterus to her abdominal pain.   . TUBAL LIGATION     Social History   Socioeconomic History  . Marital status: Divorced    Spouse name: Not on file  . Number of children: 2  . Years of education: Not on file  . Highest education level: Not on file  Occupational History  . Occupation: Emergency Services    Comment: [place], [place]  Tobacco Use  . Smoking status: Never Smoker  . Smokeless tobacco: Never Used  Substance and Sexual Activity  . Alcohol use: No    Alcohol/week: 0.0 standard drinks  . Drug use: No  . Sexual activity: Not Currently    Partners: Male    Birth control/protection: Surgical    Comment: ablation  Other Topics Concern  . 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 Difficulty of Paying Living Expenses:   Food Insecurity:   .  Worried About Charity fundraiser in the Last Year:   . Arboriculturist in the Last Year:   Transportation Needs:   . Film/video editor (Medical):   Marland Kitchen Lack of Transportation (Non-Medical):   Physical Activity:   . Days of Exercise per Week:   . Minutes of Exercise per Session:   Stress:   . Feeling of Stress :   Social Connections:   . Frequency of Communication with Friends and Family:   . Frequency of Social Gatherings with Friends and Family:   . Attends Religious Services:   . Active Member of Clubs or Organizations:   . Attends Archivist Meetings:   Marland Kitchen Marital Status:    Family History  Problem Relation Age of Onset  . Hypertension Mother   . Hypertension Father   . Hyperlipidemia Father   . Diabetes Father   . Cancer Maternal Aunt   .  Diabetes Brother   . Heart attack Neg Hx   . Heart failure Neg Hx    No Known Allergies Prior to Admission medications   Medication Sig Start Date End Date Taking? Authorizing Provider  acarbose (PRECOSE) 50 MG tablet Take 1 tablet (50 mg total) by mouth 3 (three) times daily with meals. 12/05/18   Renato Shin, MD  ACCU-CHEK FASTCLIX LANCETS MISC 1 each by Does not apply route 2 (two) times daily. Use to monitor glucose levels twice per day; R73.9 06/03/18   Renato Shin, MD  APPLE CIDER VINEGAR PO Take by mouth.    [provider]  B Complex Vitamins (VITAMIN B COMPLEX PO) Take by mouth.    [provider]  Biotin 300 MCG TABS Take 5,000 mcg by mouth daily.     [provider]  BLACK CURRANT SEED OIL PO Take by mouth daily.    [provider]  Blood Glucose Monitoring Suppl (ACCU-CHEK GUIDE ME) w/Device KIT 1 each by Does not apply route 2 (two) times daily. Use to monitor glucose levels twice per day; R73.9 06/03/18   Renato Shin, MD  cetirizine (ZYRTEC) 10 MG tablet Take 10 mg by mouth daily.    [provider]  Cholecalciferol (VITAMIN D3 PO) Take 5,000 Units by mouth.    [provider]  clindamycin (CLEOCIN T) 1 % external solution Apply 1 application topically daily.  02/19/17   [provider]  clobetasol ointment (TEMOVATE) 0.05 % Apply to affected areas on scalp daily Monday-Friday. Not to face, skin folds. 01/29/17   [provider]  FERROUS SULFATE PO Take by mouth 3 (three) times a week.    [provider]  folic acid (FOLVITE) 1 MG tablet Take 1 mg daily by mouth.  01/29/17   [provider]  glucose blood (ACCU-CHEK GUIDE) test strip Use to monitor glucose levels twice per day; R73.9 06/03/18   Renato Shin, MD  hydrOXYzine (ATARAX/VISTARIL) 10 MG tablet Take 1 tablet (10 mg total) by mouth every 8 (eight) hours as needed for anxiety. 09/10/18   Lucretia Kern, DO  methotrexate (RHEUMATREX) 2.5  MG tablet Take 7.5 mg by mouth 3 (three) times a week.  01/29/17   [provider]  Multiple Vitamin (MULTIVITAMIN WITH MINERALS) TABS Take 1 tablet by mouth daily.    [provider]  rosuvastatin (CRESTOR) 20 MG tablet TAKE 1 TABLET DAILY 07/10/19   Colin Benton R, DO  sertraline (ZOLOFT) 100 MG tablet TAKE 2 TABLETS DAILY 07/10/19   Colin Benton  R, DO  vitamin B-12 (CYANOCOBALAMIN) 1000 MCG tablet Take 1,000 mcg by mouth daily.    [provider]     Positive ROS: Otherwise negative  All other systems have been reviewed and were otherwise negative with the exception of those mentioned in the HPI and as above.  Physical Exam: Constitutional: Alert, well-appearing, no acute distress Ears: External ears without lesions or tenderness. Ear canals are clear bilaterally with intact, clear TMs bilaterally. Nasal: External nose without lesions. Septum midline with mild rhinitis and moderate mucosal swelling.  Clear middle meatus bilaterally.. Clear nasal passages Fiberoptic laryngoscopy was performed through the right nostril.  The nasopharynx was clear.  The base of tongue vallecula and epiglottis were normal.  Vocal cords were clear bilaterally with normal vocal mobility.  Sometimes when voicing her voice actually is fairly strong and then other times it seems week with excessive closure over false cords. Oral: Lips and gums without lesions. Tongue and palate mucosa without lesions. Posterior oropharynx clear. Neck: No palpable adenopathy or masses Respiratory: Breathing comfortably  Skin: No facial/neck lesions or rash noted.  Laryngoscopy  Date/Time: 08/21/2019 2:24 PM Performed by: Rozetta Nunnery, MD Authorized by: Rozetta Nunnery, MD   Consent:    Consent obtained:  Verbal   Consent given by:  Patient Procedure details:    Indications: hoarseness, dysphagia, or aspiration     Medication:  Afrin   Instrument: flexible fiberoptic laryngoscope      Scope location: right nare   Sinus:    Right nasopharynx: normal   Mouth:    Oropharynx: normal     Base of tongue: normal     Epiglottis: normal   Throat:    True vocal cords: normal   Comments:     Vocal cords were clear bilaterally with normal vocal cord mobility.  This seems to be more of a functional hoarseness then secondary to any structural abnormalities.    Assessment: Hoarseness with normal laryngeal and vocal cord examination on fiberoptic laryngoscopy.   Chronic rhinitis  Plan: Prescribed Nasacort 2 sprays each nostril at night to help with the nasal symptoms.. We will refer patient to speech therapy for further evaluation and treatment of her hoarseness and weak voice  Radene Journey, MD

## 2019-08-22 ENCOUNTER — Encounter: Payer: Self-pay | Admitting: Family Medicine

## 2019-08-22 ENCOUNTER — Other Ambulatory Visit: Payer: Self-pay

## 2019-08-22 ENCOUNTER — Ambulatory Visit (INDEPENDENT_AMBULATORY_CARE_PROVIDER_SITE_OTHER): Payer: 59 | Admitting: Family Medicine

## 2019-08-22 ENCOUNTER — Other Ambulatory Visit (INDEPENDENT_AMBULATORY_CARE_PROVIDER_SITE_OTHER): Payer: Self-pay

## 2019-08-22 VITALS — BP 102/68 | HR 90 | Temp 97.5°F | Ht 62.0 in | Wt 161.2 lb

## 2019-08-22 DIAGNOSIS — R5383 Other fatigue: Secondary | ICD-10-CM | POA: Diagnosis not present

## 2019-08-22 DIAGNOSIS — E559 Vitamin D deficiency, unspecified: Secondary | ICD-10-CM | POA: Diagnosis not present

## 2019-08-22 DIAGNOSIS — E538 Deficiency of other specified B group vitamins: Secondary | ICD-10-CM | POA: Diagnosis not present

## 2019-08-22 DIAGNOSIS — R739 Hyperglycemia, unspecified: Secondary | ICD-10-CM | POA: Diagnosis not present

## 2019-08-22 DIAGNOSIS — Z23 Encounter for immunization: Secondary | ICD-10-CM | POA: Diagnosis not present

## 2019-08-22 DIAGNOSIS — E611 Iron deficiency: Secondary | ICD-10-CM | POA: Diagnosis not present

## 2019-08-22 LAB — HEMOGLOBIN A1C: Hgb A1c MFr Bld: 5.9 % (ref 4.6–6.5)

## 2019-08-22 LAB — VITAMIN D 25 HYDROXY (VIT D DEFICIENCY, FRACTURES): VITD: 103.37 ng/mL (ref 30.00–100.00)

## 2019-08-22 LAB — MICROALBUMIN / CREATININE URINE RATIO
Creatinine,U: 120.9 mg/dL
Microalb Creat Ratio: 0.7 mg/g (ref 0.0–30.0)
Microalb, Ur: 0.9 mg/dL (ref 0.0–1.9)

## 2019-08-22 LAB — IBC + FERRITIN
Ferritin: 112 ng/mL (ref 10.0–291.0)
Iron: 54 ug/dL (ref 42–145)
Saturation Ratios: 14.7 % — ABNORMAL LOW (ref 20.0–50.0)
Transferrin: 262 mg/dL (ref 212.0–360.0)

## 2019-08-22 LAB — TSH: TSH: 1.52 u[IU]/mL (ref 0.35–4.50)

## 2019-08-22 LAB — VITAMIN B12: Vitamin B-12: 292 pg/mL (ref 211–911)

## 2019-08-22 NOTE — Progress Notes (Signed)
Alexis [name] DOB: Aug 18, 1970 Encounter date: 08/22/2019  This is a 49 y.o. female who presents to establish care. Chief Complaint  Patient presents with  . Establish Care    History of present illness: Saw ENT yesterday. Has been hoarse since last Monday. Was hoarse for 24moafter having COVID. Since then has had hoarseness off and on. Saw Dr. NLucia Gaskinsfor this.   Palpitations: have been ok. Follows with cardiology.   HL: has been well controlled. Tolerates crestor well.   GERD: this has resolved. Not on treatment.   IBS: taking natural laxative - constipend which has helped her be regular. Had tried linzess in the past but made her have diarrhea al the time.   B12 dRFF:MBWGYKsupplement.  Anxiety:Has decided to leave one of stressful jobs - feels that this will help her significantly with anxiety. 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 Doesn't feel like depression is issue currently.   Seasonal allergies: using zyrtec daily. Started nasacort per ENT advice.   DM: does well with acarbose.   Vitamin d def: still on vit D  Uses clobetasol for hair loss, which has helped. Also on methotrexate for this. Has alopecia areata ophiasis.   Has monthly menses still; ranges from 3-5 days. Heaviness is increasing; starting to have clots again.   Past Medical History:  Diagnosis Date  . Abnormal Pap smear of cervix 04/06/2015   -s/p colpo 03/2015 with gyn with repeat pap with HPV advised in 1 year   . Acne   . Allergy    SEASONAL  . Anemia    menorrhagia  . Anxiety   . Asthma    mild intermittent  . Bronchitis    states gets once a year  . Depression   . Fracture of fourth metatarsal bone of left foot 04/01/2015  . Gallstones    on [place] . Genital warts   . GERD (gastroesophageal reflux disease)   . GERD (gastroesophageal reflux disease) 09/25/2012  . Glucose intolerance (impaired glucose tolerance)   . History of echocardiogram    a. Echo 2/17:  Mild focal basal septal hypertrophy, EF 55-60%, normal wall motion, grade 1 diastolic dysfunction, trace MR, trace TR  . History of gestational diabetes   . Hyperlipemia   . Irritable bowel syndrome   . Nephrolithiasis 65/99/3570  Renal colic of February 21779-per prior PCP notes  . Neuroma    in her left foot, has numbness in her toes (on gabapentin)  . 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 Injected 08/24/2015   . Pre-diabetes    Past Surgical History:  Procedure Laterality Date  . CESAREAN SECTION    . cryosurgery cervix    . ENDOMETRIAL ABLATION    . GYNECOLOGIC CRYOSURGERY    . LAPROSCOPIC     After her ablation, she got sick, had a laparoscopy bowel was attached to her uterus. No bowel was removed. No bowel injury. She did have adhesions of her uterus to her abdominal pain.   . TUBAL LIGATION     No Known Allergies Current Meds  Medication Sig  . acarbose (PRECOSE) 50 MG tablet Take 1 tablet (50 mg total) by mouth 3 (three) times daily with meals.  .Marland KitchenACCU-CHEK FASTCLIX LANCETS MISC 1 each by Does not apply route 2 (two) times daily. Use to monitor glucose levels twice per day; R73.9  . Blood Glucose Monitoring Suppl (ACCU-CHEK GUIDE  ME) w/Device KIT 1 each by Does not apply route 2 (two) times daily. Use to monitor glucose levels twice per day; R73.9  . cetirizine (ZYRTEC) 10 MG tablet Take 10 mg by mouth daily.  . Cholecalciferol (VITAMIN D3 PO) Take 5,000 Units by mouth.  . clindamycin (CLEOCIN T) 1 % external solution Apply 1 application topically daily.   . clobetasol ointment (TEMOVATE) 0.05 % Apply to affected areas on scalp daily Monday-Friday. Not to face, skin folds.  . FERROUS SULFATE PO Take by mouth 3 (three) times a week.  . glucose blood (ACCU-CHEK GUIDE) test strip Use to monitor glucose levels twice per day; R73.9  . hydrOXYzine (ATARAX/VISTARIL) 10 MG tablet Take 1 tablet (10 mg total) by mouth every 8  (eight) hours as needed for anxiety.  . methotrexate (RHEUMATREX) 2.5 MG tablet Take 7.5 mg by mouth. Take 4 tablets once a week  . Multiple Vitamin (MULTIVITAMIN WITH MINERALS) TABS Take 1 tablet by mouth daily.  . rosuvastatin (CRESTOR) 20 MG tablet TAKE 1 TABLET DAILY  . sertraline (ZOLOFT) 100 MG tablet TAKE 2 TABLETS DAILY  . triamcinolone (NASACORT ALLERGY 24HR) 55 MCG/ACT AERO nasal inhaler Place 2 sprays into the nose daily.  . vitamin B-12 (CYANOCOBALAMIN) 1000 MCG tablet Take 1,000 mcg by mouth 3 (three) times a week.    Social History   Tobacco Use  . Smoking status: Never Smoker  . Smokeless tobacco: Never Used  Substance Use Topics  . Alcohol use: No    Alcohol/week: 0.0 standard drinks   Family History  Problem Relation Age of Onset  . Hypertension Mother   . Hypertension Father   . Hyperlipidemia Father   . Dementia Father 79  . Cancer Maternal Aunt   . Diabetes Brother        pre-diabetes  . Healthy Sister   . Dementia Maternal Grandmother   . Other Maternal Grandfather        hospitalized mental health ?PTSD  . Alzheimer's disease Paternal Grandmother   . Healthy Sister   . Healthy Brother   . Heart attack Neg Hx   . Heart failure Neg Hx      Review of Systems  Constitutional: Negative for chills, fatigue and fever.  Respiratory: Negative for cough, chest tightness, shortness of breath and wheezing.   Cardiovascular: Negative for chest pain, palpitations and leg swelling.    Objective:  BP 102/68 (BP Location: Left Arm, Patient Position: Sitting, Cuff Size: Normal)   Pulse 90   Temp (!) 97.5 F (36.4 C) (Temporal)   Ht 5' 2" (1.575 m)   Wt 161 lb 3.2 oz (73.1 kg)   LMP 08/07/2019 (Exact Date)   BMI 29.48 kg/m   Weight: 161 lb 3.2 oz (73.1 kg)   BP Readings from Last 3 Encounters:  08/22/19 102/68  02/18/19 121/85  06/13/18 104/70   Wt Readings from Last 3 Encounters:  08/22/19 161 lb 3.2 oz (73.1 kg)  08/05/19 156 lb 9.6 oz (71 kg)   01/07/19 158 lb (71.7 kg)    EXAM: Physical Exam Constitutional:      General: She is not in acute distress.    Appearance: She is well-developed.  Cardiovascular:     Rate and Rhythm: Normal rate and regular rhythm.     Heart sounds: Normal heart sounds. No murmur. No friction rub.  Pulmonary:     Effort: Pulmonary effort is normal. No respiratory distress.     Breath sounds: Normal breath sounds. No wheezing   or rales.  Musculoskeletal:     Right lower leg: No edema.     Left lower leg: No edema.  Neurological:     Mental Status: She is alert and oriented to person, place, and time.  Psychiatric:        Behavior: Behavior normal.     Assessment/Plan 1. Need for Tdap vaccination Needs this for work as she will be in hospital and they require Tdap. - Tdap vaccine greater than or equal to 7yo IM  2. Fatigue, unspecified type Will start with labwork; further eval pending results. I did note that mch/mcv were lower on bloodwork recently, so we will also check iron levels.  - TSH; Future  3. Hyperglycemia Follows with Dr. Loanne Drilling. Has been well controlled.  - Hemoglobin A1c; Future - Microalbumin / creatinine urine ratio; Future  4. Vitamin D deficiency - VITAMIN D 25 Hydroxy (Vit-D Deficiency, Fractures); Future  5. B12 deficiency - Vitamin B12; Future  6. Iron deficiency She is regularly taking ferrous sulfate. - IBC + Ferritin; Future  Return in about 6 months (around 02/22/2020) for physical exam.  I provided  minutes of non-face-to-face time during this encounter.   Micheline Rough, MD

## 2019-08-25 ENCOUNTER — Other Ambulatory Visit (INDEPENDENT_AMBULATORY_CARE_PROVIDER_SITE_OTHER): Payer: Self-pay

## 2019-08-25 DIAGNOSIS — R490 Dysphonia: Secondary | ICD-10-CM

## 2019-11-06 ENCOUNTER — Telehealth: Payer: Self-pay | Admitting: Endocrinology

## 2019-11-06 NOTE — Telephone Encounter (Signed)
1.  Please schedule f/u appt 2.  Then please refill x 2 mos, pending that appt.  

## 2019-11-06 NOTE — Telephone Encounter (Signed)
Please review pt request and advise 

## 2019-11-06 NOTE — Telephone Encounter (Signed)
Medication Refill Request  Did you call your pharmacy and request this refill first? Yes  . If patient has not contacted pharmacy first, instruct them to do so for future refills.  . Remind them that contacting the pharmacy for their refill is the quickest method to get the refill.  . Refill policy also stated that it will take anywhere between 24-72 hours to receive the refill.    Name of medication? acarbose  Is this a 90 day supply?   Name and location of pharmacy?  [facility] [place] [address] Ph# [phone]

## 2019-11-07 ENCOUNTER — Other Ambulatory Visit: Payer: Self-pay

## 2019-11-07 DIAGNOSIS — R739 Hyperglycemia, unspecified: Secondary | ICD-10-CM

## 2019-11-07 MED ORDER — ACARBOSE 50 MG PO TABS: 50.0000 mg | ORAL_TABLET | Freq: Three times a day (TID) | ORAL | 0 refills | Status: DC

## 2019-11-07 NOTE — Telephone Encounter (Signed)
Outpatient Medication Detail   Disp Refills Start End   acarbose (PRECOSE) 50 MG tablet 180 tablet 0 11/07/2019    Sig - Route: Take 1 tablet (50 mg total) by mouth 3 (three) times daily with meals. WILL ONLY PROVIDE 60 DAY SUPPLY. MUST KEEP APPT - Oral   Sent to pharmacy as: acarbose (PRECOSE) 50 MG tablet   E-Prescribing Status: Receipt confirmed by pharmacy (11/07/2019  7:57 AM EDT)

## 2019-11-11 ENCOUNTER — Telehealth: Payer: Self-pay | Admitting: Family Medicine

## 2019-11-11 NOTE — Telephone Encounter (Signed)
pt is requesting a refills for  Sertraline (ZOLOFT) 100 MG tablet, rosuvastatin (CRESTOR) 20 MG tablet [facility] in [place]  Address: [address] [place] [address]  Phone: [phone]

## 2019-11-12 ENCOUNTER — Other Ambulatory Visit: Payer: Self-pay | Admitting: Family Medicine

## 2019-11-12 MED ORDER — ROSUVASTATIN CALCIUM 20 MG PO TABS: 20.0000 mg | ORAL_TABLET | Freq: Every day | ORAL | 3 refills | Status: DC

## 2019-11-12 MED ORDER — SERTRALINE HCL 100 MG PO TABS: ORAL_TABLET | ORAL | 3 refills | Status: DC

## 2019-11-12 NOTE — Telephone Encounter (Signed)
Refill sent.

## 2019-12-12 ENCOUNTER — Telehealth: Payer: 59 | Admitting: Endocrinology

## 2019-12-17 ENCOUNTER — Other Ambulatory Visit: Payer: Self-pay | Admitting: Endocrinology

## 2019-12-17 DIAGNOSIS — R739 Hyperglycemia, unspecified: Secondary | ICD-10-CM

## 2020-02-02 ENCOUNTER — Telehealth: Payer: Self-pay

## 2020-02-02 NOTE — Telephone Encounter (Signed)
Patient is having chronic yeast infections.

## 2020-02-02 NOTE — Telephone Encounter (Signed)
Spoke with patient. Patient reports she has a hx of chronic vaginal yeast infections, has been treating OTC with OTC boric acid vaginal suppositories PRN for the past 4-5 months. Symptoms typically start just before her menses. Reports vaginal itching, burning and thick, "cottage cheese" yellow d/c. Reports increase cramping and bleeding with menses. LMP 01/14/20. Tubal ligation for contraceptive. Hx of endometrial ablation. Last SA over 1 month ago, new partner. Denies fever/chills, pelvic pain, urinary symptoms, N/V. Requesting RX.   Patient reports she travels for her job, currently living in [place] until mid December. Advised OV needed for for further evaluation either in office or with local provider or Urgent Care. Patient is requesting to schedule with Dr. Talbert Nan on a Friday. OV scheduled for 11/12 at 10:30am for further evaluation. Advised also overdue for AEX, last AEX 06/13/18, patient agreeable to schedule. AEX scheduled for 03/16/20 at 3:30pm. Precautions provided for new or worsening symptoms. Advised I will send to provider for review, our office will return call if any additional recommendations. Patient agreeable.   Routing to Dr. Talbert Nan for final review.

## 2020-02-04 ENCOUNTER — Other Ambulatory Visit: Payer: Self-pay

## 2020-02-04 ENCOUNTER — Telehealth (INDEPENDENT_AMBULATORY_CARE_PROVIDER_SITE_OTHER): Payer: PRIVATE HEALTH INSURANCE | Admitting: Endocrinology

## 2020-02-04 DIAGNOSIS — R739 Hyperglycemia, unspecified: Secondary | ICD-10-CM

## 2020-02-04 MED ORDER — ACARBOSE 50 MG PO TABS: 50.0000 mg | ORAL_TABLET | Freq: Three times a day (TID) | ORAL | 2 refills | Status: DC

## 2020-02-04 NOTE — Patient Instructions (Addendum)
Blood tests are requested for you today.  We'll let you know about the results.  Please continue the same acarbose.  We can increase this if necessary.   Please come back for a follow-up appointment in 6 months.

## 2020-02-04 NOTE — Progress Notes (Signed)
Subjective:    Patient ID: Alexis [name], female    DOB: [birth year], 49 y.o.   MRN: [id number]  HPI telehealth visit today via doxy video visit.  Alternatives to telehealth are presented to this patient, and the patient agrees to the telehealth visit.   Pt is advised of the cost of the visit, and agrees to this, also.   Patient is at work, and I am at the office.   pt returns for f/u of hyperglycemia with reactive hypoglycemia (dx'ed in 2019, when A1c was 6.0% (she had GDM in 1998); she was rx'ed acarbose).  She tolerates acarbose well.  chronic constipation persists.  She says cbg's are approx 100.    Past Medical History:  Diagnosis Date  . Abnormal Pap smear of cervix 04/06/2015   -s/p colpo 03/2015 with gyn with repeat pap with HPV advised in 1 year   . Acne   . Allergy    SEASONAL  . Anemia    menorrhagia  . Anxiety   . Asthma    mild intermittent  . Bronchitis    states gets once a year  . Depression   . Fracture of fourth metatarsal bone of left foot 04/01/2015  . Gallstones    on [place]  . Genital warts   . GERD (gastroesophageal reflux disease)   . GERD (gastroesophageal reflux disease) 09/25/2012  . Glucose intolerance (impaired glucose tolerance)   . History of echocardiogram    a. Echo 2/17: Mild focal basal septal hypertrophy, EF 55-60%, normal wall motion, grade 1 diastolic dysfunction, trace MR, trace TR  . History of gestational diabetes   . Hyperlipemia   . Irritable bowel syndrome   . Nephrolithiasis 09/30/5282   Renal colic of February 1324 -per prior PCP notes  . Neuroma    in her left foot, has numbness in her toes (on gabapentin)  . 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 Injected 08/24/2015   . Pre-diabetes     Past Surgical History:  Procedure Laterality Date  . CESAREAN SECTION    . cryosurgery cervix    . ENDOMETRIAL ABLATION    . GYNECOLOGIC CRYOSURGERY    . LAPROSCOPIC     After her  ablation, she got sick, had a laparoscopy bowel was attached to her uterus. No bowel was removed. No bowel injury. She did have adhesions of her uterus to her abdominal pain.   . TUBAL LIGATION      Social History   Socioeconomic History  . Marital status: Divorced    Spouse name: Not on file  . Number of children: 2  . Years of education: Not on file  . Highest education level: Not on file  Occupational History  . Occupation: Emergency Services    Comment: [place], [place]  Tobacco Use  . Smoking status: Never Smoker  . Smokeless tobacco: Never Used  Vaping Use  . Vaping Use: Never used  Substance and Sexual Activity  . Alcohol use: No    Alcohol/week: 0.0 standard drinks  . Drug use: No  . Sexual activity: Not Currently    Partners: Male    Birth control/protection: Surgical    Comment: ablation  Other Topics Concern  . 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  Difficulty of Paying Living Expenses: Not on file  Food Insecurity:   . Worried About Charity fundraiser in the Last Year: Not on file  . Ran Out of Food in the Last Year: Not on file  Transportation Needs:   . Lack of Transportation (Medical): Not on file  . Lack of Transportation (Non-Medical): Not on file  Physical Activity:   . Days of Exercise per Week: Not on file  . Minutes of Exercise per Session: Not on file  Stress:   . Feeling of Stress : Not on file  Social Connections:   . Frequency of Communication with Friends and Family: Not on file  . Frequency of Social Gatherings with Friends and Family: Not on file  . Attends Religious Services: Not on file  . Active Member of Clubs or Organizations: Not on file  . Attends Archivist Meetings: Not on file  . Marital Status: Not on file  Intimate Partner Violence:   .  Fear of Current or Ex-Partner: Not on file  . Emotionally Abused: Not on file  . Physically Abused: Not on file  . Sexually Abused: Not on file    Current Outpatient Medications on File Prior to Visit  Medication Sig Dispense Refill  . ACCU-CHEK FASTCLIX LANCETS MISC 1 each by Does not apply route 2 (two) times daily. Use to monitor glucose levels twice per day; R73.9 102 each 12  . Blood Glucose Monitoring Suppl (ACCU-CHEK GUIDE ME) w/Device KIT 1 each by Does not apply route 2 (two) times daily. Use to monitor glucose levels twice per day; R73.9 1 kit 0  . cetirizine (ZYRTEC) 10 MG tablet Take 10 mg by mouth daily.    . Cholecalciferol (VITAMIN D3 PO) Take 5,000 Units by mouth.    . clindamycin (CLEOCIN T) 1 % external solution Apply 1 application topically daily.     . clobetasol ointment (TEMOVATE) 0.05 % Apply to affected areas on scalp daily Monday-Friday. Not to face, skin folds.    Lyndle Herrlich SULFATE PO Take by mouth 3 (three) times a week.    Marland Kitchen glucose blood (ACCU-CHEK GUIDE) test strip Use to monitor glucose levels twice per day; R73.9 100 each 12  . hydrOXYzine (ATARAX/VISTARIL) 10 MG tablet Take 1 tablet (10 mg total) by mouth every 8 (eight) hours as needed for anxiety. 20 tablet 0  . methotrexate (RHEUMATREX) 2.5 MG tablet Take 7.5 mg by mouth. Take 4 tablets once a week    . Multiple Vitamin (MULTIVITAMIN WITH MINERALS) TABS Take 1 tablet by mouth daily.    . rosuvastatin (CRESTOR) 20 MG tablet Take 1 tablet (20 mg total) by mouth daily. 90 tablet 3  . sertraline (ZOLOFT) 100 MG tablet TAKE 2 TABLETS DAILY 180 tablet 3  . triamcinolone (NASACORT ALLERGY 24HR) 55 MCG/ACT AERO nasal inhaler Place 2 sprays into the nose daily.    . vitamin B-12 (CYANOCOBALAMIN) 1000 MCG tablet Take 1,000 mcg by mouth 3 (three) times a week.      No current facility-administered medications on file prior to visit.    No Known Allergies  Family History  Problem Relation Age of Onset  .  Hypertension Mother   . Hypertension Father   . Hyperlipidemia Father   . Dementia Father 28  . Cancer Maternal Aunt   . Diabetes Brother        pre-diabetes  . Healthy Sister   . Dementia Maternal Grandmother   . Other Maternal Grandfather  hospitalized mental health ?PTSD  . Alzheimer's disease Paternal Grandmother   . Healthy Sister   . Healthy Brother   . Heart attack Neg Hx   . Heart failure Neg Hx     There were no vitals taken for this visit.   Review of Systems She has lost a few lbs.  She denies hypoglycemia.      Objective:   Physical Exam      Assessment & Plan:  Hyperglycemia with reactive hypoglycemia: clinically stable Constipation, uncontrolled.  Increasing acarbose might help.  Patient Instructions  Blood tests are requested for you today.  We'll let you know about the results.  Please continue the same acarbose.  We can increase this if necessary.   Please come back for a follow-up appointment in 6 months.

## 2020-02-12 NOTE — Progress Notes (Deleted)
GYNECOLOGY  VISIT   HPI: 49 y.o.   Divorced Black or [place] American Not Hispanic or Latino  female   [phone] with No LMP recorded.   here for     GYNECOLOGIC HISTORY: No LMP recorded. Contraception:*** Menopausal hormone therapy: ***        OB History    Gravida  2   Para  2   Term  1   Preterm  1   AB      Living  2     SAB      TAB      Ectopic      Multiple      Live Births                 Patient Active Problem List   Diagnosis Date Noted  . Hyperglycemia 10/18/2016  . Anemia 10/18/2016  . Obesity 10/18/2016  . B12 deficiency 06/08/2016  . PVC's (premature ventricular contractions) 07/06/2015  . IBS (irritable bowel syndrome) 06/17/2015  . Seasonal allergies 06/17/2015  . Anxiety   . MDD (major depressive disorder) 03/09/2015  . Pure hypercholesterolemia 09/25/2012    Past Medical History:  Diagnosis Date  . Abnormal Pap smear of cervix 04/06/2015   -s/p colpo 03/2015 with gyn with repeat pap with HPV advised in 1 year   . Acne   . Allergy    SEASONAL  . Anemia    menorrhagia  . Anxiety   . Asthma    mild intermittent  . Bronchitis    states gets once a year  . Depression   . Fracture of fourth metatarsal bone of left foot 04/01/2015  . Gallstones    on [place]  . Genital warts   . GERD (gastroesophageal reflux disease)   . GERD (gastroesophageal reflux disease) 09/25/2012  . Glucose intolerance (impaired glucose tolerance)   . History of echocardiogram    a. Echo 2/17: Mild focal basal septal hypertrophy, EF 55-60%, normal wall motion, grade 1 diastolic dysfunction, trace MR, trace TR  . History of gestational diabetes   . Hyperlipemia   . Irritable bowel syndrome   . Nephrolithiasis 4/74/2595   Renal colic of February 6387 -per prior PCP notes  . Neuroma    in her left foot, has numbness in her toes (on gabapentin)  . 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 Injected 08/24/2015    . Pre-diabetes     Past Surgical History:  Procedure Laterality Date  . CESAREAN SECTION    . cryosurgery cervix    . ENDOMETRIAL ABLATION    . GYNECOLOGIC CRYOSURGERY    . LAPROSCOPIC     After her ablation, she got sick, had a laparoscopy bowel was attached to her uterus. No bowel was removed. No bowel injury. She did have adhesions of her uterus to her abdominal pain.   . TUBAL LIGATION      Current Outpatient Medications  Medication Sig Dispense Refill  . acarbose (PRECOSE) 50 MG tablet Take 1 tablet (50 mg total) by mouth 3 (three) times daily with meals. 270 tablet 2  . ACCU-CHEK FASTCLIX LANCETS MISC 1 each by Does not apply route 2 (two) times daily. Use to monitor glucose levels twice per day; R73.9 102 each 12  . Blood Glucose Monitoring Suppl (ACCU-CHEK GUIDE ME) w/Device KIT 1 each by Does not apply route 2 (two) times daily. Use to monitor glucose levels twice per day; R73.9 1 kit  0  . cetirizine (ZYRTEC) 10 MG tablet Take 10 mg by mouth daily.    . Cholecalciferol (VITAMIN D3 PO) Take 5,000 Units by mouth.    . clindamycin (CLEOCIN T) 1 % external solution Apply 1 application topically daily.     . clobetasol ointment (TEMOVATE) 0.05 % Apply to affected areas on scalp daily Monday-Friday. Not to face, skin folds.    Lyndle Herrlich SULFATE PO Take by mouth 3 (three) times a week.    Marland Kitchen glucose blood (ACCU-CHEK GUIDE) test strip Use to monitor glucose levels twice per day; R73.9 100 each 12  . hydrOXYzine (ATARAX/VISTARIL) 10 MG tablet Take 1 tablet (10 mg total) by mouth every 8 (eight) hours as needed for anxiety. 20 tablet 0  . methotrexate (RHEUMATREX) 2.5 MG tablet Take 7.5 mg by mouth. Take 4 tablets once a week    . Multiple Vitamin (MULTIVITAMIN WITH MINERALS) TABS Take 1 tablet by mouth daily.    . rosuvastatin (CRESTOR) 20 MG tablet Take 1 tablet (20 mg total) by mouth daily. 90 tablet 3  . sertraline (ZOLOFT) 100 MG tablet TAKE 2 TABLETS DAILY 180 tablet 3  .  triamcinolone (NASACORT ALLERGY 24HR) 55 MCG/ACT AERO nasal inhaler Place 2 sprays into the nose daily.    . vitamin B-12 (CYANOCOBALAMIN) 1000 MCG tablet Take 1,000 mcg by mouth 3 (three) times a week.      No current facility-administered medications for this visit.     ALLERGIES: Patient has no known allergies.  Family History  Problem Relation Age of Onset  . Hypertension Mother   . Hypertension Father   . Hyperlipidemia Father   . Dementia Father 8  . Cancer Maternal Aunt   . Diabetes Brother        pre-diabetes  . Healthy Sister   . Dementia Maternal Grandmother   . Other Maternal Grandfather        hospitalized mental health ?PTSD  . Alzheimer's disease Paternal Grandmother   . Healthy Sister   . Healthy Brother   . Heart attack Neg Hx   . Heart failure Neg Hx     Social History   Socioeconomic History  . Marital status: Divorced    Spouse name: Not on file  . Number of children: 2  . Years of education: Not on file  . Highest education level: Not on file  Occupational History  . 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 Smoking status: Never Smoker  . Smokeless tobacco: Never Used  Vaping Use  . Vaping Use: Never used  Substance and Sexual Activity  . Alcohol use: No    Alcohol/week: 0.0 standard drinks  . Drug use: No  . Sexual activity: Not Currently    Partners: Male    Birth control/protection: Surgical    Comment: ablation  Other Topics Concern  . 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 Difficulty of Paying Living Expenses: Not on file  Food Insecurity:   . Worried About Charity fundraiser in the Last Year: Not on file  . Ran Out of Food in the Last Year: Not on file  Transportation Needs:   . Lack of Transportation (Medical): Not  on file  . Lack of Transportation (Non-Medical):  Not on file  Physical Activity:   . Days of Exercise per Week: Not on file  . Minutes of Exercise per Session: Not on file  Stress:   . Feeling of Stress : Not on file  Social Connections:   . Frequency of Communication with Friends and Family: Not on file  . Frequency of Social Gatherings with Friends and Family: Not on file  . Attends Religious Services: Not on file  . Active Member of Clubs or Organizations: Not on file  . Attends Archivist Meetings: Not on file  . Marital Status: Not on file  Intimate Partner Violence:   . Fear of Current or Ex-Partner: Not on file  . Emotionally Abused: Not on file  . Physically Abused: Not on file  . Sexually Abused: Not on file    ROS  PHYSICAL EXAMINATION:    There were no vitals taken for this visit.    General appearance: alert, cooperative and appears stated age Neck: no adenopathy, supple, symmetrical, trachea midline and thyroid {CHL AMB PHY EX THYROID NORM DEFAULT:[phone]::"normal to inspection and palpation"} Breasts: {Exam; breast:13139::"normal appearance, no masses or tenderness"} Abdomen: soft, non-tender; non distended, no masses,  no organomegaly  Pelvic: External genitalia:  no lesions              Urethra:  normal appearing urethra with no masses, tenderness or lesions              Bartholins and Skenes: normal                 Vagina: normal appearing vagina with normal color and discharge, no lesions              Cervix: {CHL AMB PHY EX CERVIX NORM [phone]::"no lesions"}              Bimanual Exam:  Uterus:  {CHL AMB PHY EX UTERUS NORM [phone]::"normal size, contour, position, consistency, mobility, non-tender"}              Adnexa: {CHL AMB PHY EX ADNEXA NO MASS DEFAULT:[phone]::"no mass, fullness, tenderness"}              Rectovaginal: {yes no:[id number]}.  Confirms.              Anus:  normal sphincter tone, no lesions  Chaperone  was present for exam.  ASSESSMENT     PLAN    An After Visit Summary was printed and given to the patient.  *** minutes face to face time of which over 50% was spent in counseling.

## 2020-02-13 ENCOUNTER — Ambulatory Visit: Payer: PRIVATE HEALTH INSURANCE | Admitting: Obstetrics and Gynecology

## 2020-02-13 ENCOUNTER — Encounter: Payer: Self-pay | Admitting: Obstetrics and Gynecology

## 2020-02-13 ENCOUNTER — Other Ambulatory Visit: Payer: Self-pay

## 2020-02-13 ENCOUNTER — Other Ambulatory Visit (HOSPITAL_COMMUNITY)
Admission: RE | Admit: 2020-02-13 | Discharge: 2020-02-13 | Disposition: A | Discharge status: Home / Self Care | Payer: PRIVATE HEALTH INSURANCE | Source: Ambulatory Visit | Attending: Obstetrics and Gynecology | Admitting: Obstetrics and Gynecology

## 2020-02-13 ENCOUNTER — Telehealth: Payer: Self-pay

## 2020-02-13 ENCOUNTER — Ambulatory Visit: Payer: Self-pay | Admitting: Obstetrics and Gynecology

## 2020-02-13 VITALS — BP 120/78 | HR 72 | Resp 16 | Wt 157.0 lb

## 2020-02-13 DIAGNOSIS — N946 Dysmenorrhea, unspecified: Secondary | ICD-10-CM | POA: Diagnosis not present

## 2020-02-13 DIAGNOSIS — N920 Excessive and frequent menstruation with regular cycle: Secondary | ICD-10-CM

## 2020-02-13 DIAGNOSIS — Z113 Encounter for screening for infections with a predominantly sexual mode of transmission: Secondary | ICD-10-CM | POA: Diagnosis present

## 2020-02-13 DIAGNOSIS — Z0000 Encounter for general adult medical examination without abnormal findings: Secondary | ICD-10-CM

## 2020-02-13 DIAGNOSIS — Z124 Encounter for screening for malignant neoplasm of cervix: Secondary | ICD-10-CM | POA: Insufficient documentation

## 2020-02-13 DIAGNOSIS — E673 Hypervitaminosis D: Secondary | ICD-10-CM

## 2020-02-13 DIAGNOSIS — R7303 Prediabetes: Secondary | ICD-10-CM | POA: Diagnosis not present

## 2020-02-13 DIAGNOSIS — Z01419 Encounter for gynecological examination (general) (routine) without abnormal findings: Secondary | ICD-10-CM

## 2020-02-13 DIAGNOSIS — N761 Subacute and chronic vaginitis: Secondary | ICD-10-CM

## 2020-02-13 MED ORDER — BETAMETHASONE VALERATE 0.1 % EX OINT: 1.0000 "application " | TOPICAL_OINTMENT | Freq: Two times a day (BID) | CUTANEOUS | 0 refills | Status: DC

## 2020-02-13 MED ORDER — NORETHIN ACE-ETH ESTRAD-FE 1-20 MG-MCG PO TABS: 1.0000 | ORAL_TABLET | Freq: Every day | ORAL | 0 refills | Status: DC

## 2020-02-13 NOTE — Telephone Encounter (Signed)
Patient did not keep office visit for today.   Routing to Dr. Talbert Nan and Triage.

## 2020-02-13 NOTE — Progress Notes (Signed)
GYNECOLOGY  VISIT   HPI: 49 y.o.   Divorced Black or [place] American Not Hispanic or Latino  female   [phone] with Patient's last menstrual period was 02/10/2020.   here for vaginitis and cycle problems. She is due for an annual. Patient c/o having heavy periods with clots, severe cramping, and recurring yeast infections for the last 4-5 months. Per patient also has headaches and mood swings either two weeks prior or two weeks after menstrual cycle.    For the last 4-5 months her cycles have been heavier with clots. Coming monthly x 4-5 days. Saturating a large pad 3 x a day. Starting to bleed through her pad at night. Cramps are bad, can wake her up.  She has started taking an iron supplement 4 x a weeks.    She has a h/o an endometrial ablation in 2014. No h/o fibroids.   She c/o recurring yeast infections, using boric acid.  She c/o itching, worse the week prior to her cycle. She has a thick, clumpy vaginal d/c. This has been going on for the last several months.   Sexually active, same partner x 6 months.   She is travelling for work. She is a clinical case Freight forwarder, social working. Currently in [place], going to [place].   The current method of family planning is tubal ligation.    Exercising: No.  not currently Smoker:  no  Health Maintenance: Pap:05/17/2017 WNL NEG HP;, 05-11-16 WNL NEG Hr HPV; 12-6/16 WNL + HR HPV History of abnormal Pap:Yes+ HPV MMG:02-24-14 Left breast U/A WNL Colonoscopy:05-12-14 Normal AVW:UJWJX TDaP:05-09-16 Gardasil:N/A   reports that she has never smoked. She has never used smokeless tobacco. She reports that she does not drink alcohol or use drugs  GYNECOLOGIC HISTORY: Patient's last menstrual period was 02/10/2020. Contraception:Tubal ligation Menopausal hormone therapy: none        OB History    Gravida  2   Para  2   Term  1   Preterm  1   AB      Living  2     SAB      TAB      Ectopic      Multiple       Live Births                 Patient Active Problem List   Diagnosis Date Noted  . Hyperglycemia 10/18/2016  . Anemia 10/18/2016  . Obesity 10/18/2016  . B12 deficiency 06/08/2016  . PVC's (premature ventricular contractions) 07/06/2015  . IBS (irritable bowel syndrome) 06/17/2015  . Seasonal allergies 06/17/2015  . Anxiety   . MDD (major depressive disorder) 03/09/2015  . Pure hypercholesterolemia 09/25/2012    Past Medical History:  Diagnosis Date  . Abnormal Pap smear of cervix 04/06/2015   -s/p colpo 03/2015 with gyn with repeat pap with HPV advised in 1 year   . Acne   . Allergy    SEASONAL  . Anemia    menorrhagia  . Anxiety   . Asthma    mild intermittent  . Bronchitis    states gets once a year  . Depression   . Fracture of fourth metatarsal bone of left foot 04/01/2015  . Gallstones    on [place]  . Genital warts   . GERD (gastroesophageal reflux disease)   . GERD (gastroesophageal reflux disease) 09/25/2012  . Glucose intolerance (impaired glucose tolerance)   . History of echocardiogram    a. Echo 2/17: Mild  focal basal septal hypertrophy, EF 55-60%, normal wall motion, grade 1 diastolic dysfunction, trace MR, trace TR  . History of gestational diabetes   . Hyperlipemia   . Irritable bowel syndrome   . Nephrolithiasis 08/11/2583   Renal colic of February 2778 -per prior PCP notes  . Neuroma    in her left foot, has numbness in her toes (on gabapentin)  . 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 Injected 08/24/2015   . Pre-diabetes     Past Surgical History:  Procedure Laterality Date  . CESAREAN SECTION    . cryosurgery cervix    . ENDOMETRIAL ABLATION    . GYNECOLOGIC CRYOSURGERY    . LAPROSCOPIC     After her ablation, she got sick, had a laparoscopy bowel was attached to her uterus. No bowel was removed. No bowel injury. She did have adhesions of her uterus to her abdominal pain.   . TUBAL  LIGATION      Current Outpatient Medications  Medication Sig Dispense Refill  . acarbose (PRECOSE) 50 MG tablet Take 1 tablet (50 mg total) by mouth 3 (three) times daily with meals. 270 tablet 2  . ACCU-CHEK FASTCLIX LANCETS MISC 1 each by Does not apply route 2 (two) times daily. Use to monitor glucose levels twice per day; R73.9 102 each 12  . Blood Glucose Monitoring Suppl (ACCU-CHEK GUIDE ME) w/Device KIT 1 each by Does not apply route 2 (two) times daily. Use to monitor glucose levels twice per day; R73.9 1 kit 0  . cetirizine (ZYRTEC) 10 MG tablet Take 10 mg by mouth daily.    . Cholecalciferol (VITAMIN D3 PO) Take 5,000 Units by mouth.    . clindamycin (CLEOCIN T) 1 % external solution Apply 1 application topically daily.     . clobetasol ointment (TEMOVATE) 0.05 % Apply to affected areas on scalp daily Monday-Friday. Not to face, skin folds.    . Dapsone 5 % topical gel Apply topically.    Marland Kitchen FERROUS SULFATE PO Take by mouth 3 (three) times a week.    Marland Kitchen glucose blood (ACCU-CHEK GUIDE) test strip Use to monitor glucose levels twice per day; R73.9 100 each 12  . hydrocortisone 2.5 % cream APPLY TWICE DAILY FOR 2 WEEKS, THEN DAILY FOR TWO WEEKS, THEN DAILY AS NEEDED    . methotrexate (RHEUMATREX) 2.5 MG tablet Take 7.5 mg by mouth. Take 4 tablets once a week    . Multiple Vitamin (MULTIVITAMIN WITH MINERALS) TABS Take 1 tablet by mouth daily.    . propranolol (INDERAL) 10 MG tablet propranolol 10 mg tablet  take 1 tablet by mouth four times a day if needed    . rosuvastatin (CRESTOR) 20 MG tablet Take 1 tablet (20 mg total) by mouth daily. 90 tablet 3  . sertraline (ZOLOFT) 100 MG tablet TAKE 2 TABLETS DAILY 180 tablet 3  . triamcinolone (NASACORT ALLERGY 24HR) 55 MCG/ACT AERO nasal inhaler Place 2 sprays into the nose daily.    . vitamin B-12 (CYANOCOBALAMIN) 1000 MCG tablet Take 1,000 mcg by mouth 3 (three) times a week.     . hydrOXYzine (ATARAX/VISTARIL) 10 MG tablet Take 1 tablet  (10 mg total) by mouth every 8 (eight) hours as needed for anxiety. (Patient not taking: Reported on 02/13/2020) 20 tablet 0   No current facility-administered medications for this visit.     ALLERGIES: Patient has no known allergies.  Family History  Problem Relation Age  of Onset  . Hypertension Mother   . Hypertension Father   . Hyperlipidemia Father   . Dementia Father 55  . Cancer Maternal Aunt   . Diabetes Brother        pre-diabetes  . Healthy Sister   . Dementia Maternal Grandmother   . Other Maternal Grandfather        hospitalized mental health ?PTSD  . Alzheimer's disease Paternal Grandmother   . Healthy Sister   . Healthy Brother   . Heart attack Neg Hx   . Heart failure Neg Hx     Social History   Socioeconomic History  . Marital status: Divorced    Spouse name: Not on file  . Number of children: 2  . Years of education: Not on file  . Highest education level: Not on file  Occupational History  . Occupation: Emergency Services    Comment: [place], [place]  Tobacco Use  . Smoking status: Never Smoker  . Smokeless tobacco: Never Used  Vaping Use  . Vaping Use: Never used  Substance and Sexual Activity  . Alcohol use: No    Alcohol/week: 0.0 standard drinks  . Drug use: No  . Sexual activity: Not Currently    Partners: Male    Birth control/protection: Surgical    Comment: ablation  Other Topics Concern  . 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 Difficulty of Paying Living Expenses: Not on file  Food Insecurity:   . Worried About Charity fundraiser in the Last Year: Not on file  . Ran Out of Food in the Last Year: Not on file  Transportation Needs:   . Lack of Transportation (Medical): Not on file  . Lack of Transportation (Non-Medical): Not on file  Physical Activity:    . Days of Exercise per Week: Not on file  . Minutes of Exercise per Session: Not on file  Stress:   . Feeling of Stress : Not on file  Social Connections:   . Frequency of Communication with Friends and Family: Not on file  . Frequency of Social Gatherings with Friends and Family: Not on file  . Attends Religious Services: Not on file  . Active Member of Clubs or Organizations: Not on file  . Attends Archivist Meetings: Not on file  . Marital Status: Not on file  Intimate Partner Violence:   . Fear of Current or Ex-Partner: Not on file  . Emotionally Abused: Not on file  . Physically Abused: Not on file  . Sexually Abused: Not on file    Review of Systems  Constitutional: Negative.   HENT: Negative.   Eyes: Negative.   Respiratory: Negative.   Cardiovascular: Negative.   Gastrointestinal: Negative.   Genitourinary:       Vaginal itching  Musculoskeletal: Negative.   Skin: Negative.   Neurological: Negative.   Endo/Heme/Allergies: Negative.   Psychiatric/Behavioral: Negative.     PHYSICAL EXAMINATION:    BP 120/78 (BP Location: Left Arm, Patient Position: Sitting, Cuff Size: Normal)   Pulse 72   Resp 16   Wt 157 lb (71.2 kg)   LMP 02/10/2020   BMI 28.72 kg/m     General appearance: alert, cooperative and appears stated age Neck: no adenopathy, supple, symmetrical, trachea midline and thyroid normal  to inspection and palpation Breasts: normal appearance, no masses or tenderness Heart: regular rate and rhythm Lungs: CTAB Abdomen: soft, non-tender; bowel sounds normal; no masses,  no organomegaly Extremities: normal, atraumatic, no cyanosis Skin: normal color, texture and turgor, no rashes or lesions Lymph: normal cervical supraclavicular and inguinal nodes Neurologic: grossly normal   Pelvic: External genitalia:  no lesions              Urethra:  normal appearing urethra with no masses, tenderness or lesions              Bartholins and Skenes: normal                  Vagina: normal appearing vagina with normal color and discharge, no lesions              Cervix: no cervical motion tenderness and no lesions              Bimanual Exam:  Uterus:  uterus mobile, not appreciably enlarged, mildly tender              Adnexa: no mass, fullness, tenderness              Rectovaginal: Yes.  .  Confirms.              Anus:  normal sphincter tone, no lesions  Chaperone was present for exam.  Wet prep: ? clue, no trich, rare wbc KOH: no yeast PH: 5   ASSESSMENT Well woman Menorrhagia, worsening H/O endometrial ablation Dysmenorrhea  STD testing C/O recurrent yeast, slides with ?BV    PLAN Start OCP's, risks reviewed, no contraindications Needs a mammogram (prior to OCP refill) F/U in 3 months (may do virtually, will call with BP results) CBC, CMP, ferritin, TSH, vit D Depending on Hgb and response to OCP's she may need an ultrasound Pap with hpv STD testing Steroid ointment for pruritus Send nuswab    Addendum: she c/o mood changes, seem cycle related. If not better in 3 months will discuss treatment options.

## 2020-02-13 NOTE — Patient Instructions (Signed)
EXERCISE AND DIET:  We recommended that you start or continue a regular exercise program for good health. Regular exercise means any activity that makes your heart beat faster and makes you sweat.  We recommend exercising at least 30 minutes per day at least 3 days a week, preferably 4 or 5.  We also recommend a diet low in fat and sugar.  Inactivity, poor dietary choices and obesity can cause diabetes, heart attack, stroke, and kidney damage, among others.    ALCOHOL AND SMOKING:  Women should limit their alcohol intake to no more than 7 drinks/beers/glasses of wine (combined, not each!) per week. Moderation of alcohol intake to this level decreases your risk of breast cancer and liver damage. And of course, no recreational drugs are part of a healthy lifestyle.  And absolutely no smoking or even second hand smoke. Most people know smoking can cause heart and lung diseases, but did you know it also contributes to weakening of your bones? Aging of your skin?  Yellowing of your teeth and nails?  CALCIUM AND VITAMIN D:  Adequate intake of calcium and Vitamin D are recommended.  The recommendations for exact amounts of these supplements seem to change often, but generally speaking 1,200 mg of calcium (between diet and supplement) and 800 units of Vitamin D per day seems prudent. Certain women may benefit from higher intake of Vitamin D.  If you are among these women, your doctor will have told you during your visit.    PAP SMEARS:  Pap smears, to check for cervical cancer or precancers,  have traditionally been done yearly, although recent scientific advances have shown that most women can have pap smears less often.  However, every woman still should have a physical exam from her gynecologist every year. It will include a breast check, inspection of the vulva and vagina to check for abnormal growths or skin changes, a visual exam of the cervix, and then an exam to evaluate the size and shape of the uterus and  ovaries.  And after 49 years of age, a rectal exam is indicated to check for rectal cancers. We will also provide age appropriate advice regarding health maintenance, like when you should have certain vaccines, screening for sexually transmitted diseases, bone density testing, colonoscopy, mammograms, etc.   MAMMOGRAMS:  All women over 40 years old should have a yearly mammogram. Many facilities now offer a "3D" mammogram, which may cost around $50 extra out of pocket. If possible,  we recommend you accept the option to have the 3D mammogram performed.  It both reduces the number of women who will be called back for extra views which then turn out to be normal, and it is better than the routine mammogram at detecting truly abnormal areas.    COLON CANCER SCREENING: Now recommend starting at age 45. At this time colonoscopy is not covered for routine screening until 50. There are take home tests that can be done between 45-49.   COLONOSCOPY:  Colonoscopy to screen for colon cancer is recommended for all women at age 50.  We know, you hate the idea of the prep.  We agree, BUT, having colon cancer and not knowing it is worse!!  Colon cancer so often starts as a polyp that can be seen and removed at colonscopy, which can quite literally save your life!  And if your first colonoscopy is normal and you have no family history of colon cancer, most women don't have to have it again for   10 years.  Once every ten years, you can do something that may end up saving your life, right?  We will be happy to help you get it scheduled when you are ready.  Be sure to check your insurance coverage so you understand how much it will cost.  It may be covered as a preventative service at no cost, but you should check your particular policy.      Breast Self-Awareness Breast self-awareness means being familiar with how your breasts look and feel. It involves checking your breasts regularly and reporting any changes to your  health care provider. Practicing breast self-awareness is important. A change in your breasts can be a sign of a serious medical problem. Being familiar with how your breasts look and feel allows you to find any problems early, when treatment is more likely to be successful. All women should practice breast self-awareness, including women who have had breast implants. How to do a breast self-exam One way to learn what is normal for your breasts and whether your breasts are changing is to do a breast self-exam. To do a breast self-exam: Look for Changes  1. Remove all the clothing above your waist. 2. Stand in front of a mirror in a room with good lighting. 3. Put your hands on your hips. 4. Push your hands firmly downward. 5. Compare your breasts in the mirror. Look for differences between them (asymmetry), such as: ? Differences in shape. ? Differences in size. ? Puckers, dips, and bumps in one breast and not the other. 6. Look at each breast for changes in your skin, such as: ? Redness. ? Scaly areas. 7. Look for changes in your nipples, such as: ? Discharge. ? Bleeding. ? Dimpling. ? Redness. ? A change in position. Feel for Changes Carefully feel your breasts for lumps and changes. It is best to do this while lying on your back on the floor and again while sitting or standing in the shower or tub with soapy water on your skin. Feel each breast in the following way:  Place the arm on the side of the breast you are examining above your head.  Feel your breast with the other hand.  Start in the nipple area and make  inch (2 cm) overlapping circles to feel your breast. Use the pads of your three middle fingers to do this. Apply light pressure, then medium pressure, then firm pressure. The light pressure will allow you to feel the tissue closest to the skin. The medium pressure will allow you to feel the tissue that is a little deeper. The firm pressure will allow you to feel the tissue  close to the ribs.  Continue the overlapping circles, moving downward over the breast until you feel your ribs below your breast.  Move one finger-width toward the center of the body. Continue to use the  inch (2 cm) overlapping circles to feel your breast as you move slowly up toward your collarbone.  Continue the up and down exam using all three pressures until you reach your armpit.  Write Down What You Find  Write down what is normal for each breast and any changes that you find. Keep a written record with breast changes or normal findings for each breast. By writing this information down, you do not need to depend only on memory for size, tenderness, or location. Write down where you are in your menstrual cycle, if you are still menstruating. If you are having trouble noticing differences   in your breasts, do not get discouraged. With time you will become more familiar with the variations in your breasts and more comfortable with the exam. How often should I examine my breasts? Examine your breasts every month. If you are breastfeeding, the best time to examine your breasts is after a feeding or after using a breast pump. If you menstruate, the best time to examine your breasts is 5-7 days after your period is over. During your period, your breasts are lumpier, and it may be more difficult to notice changes. When should I see my health care provider? See your health care provider if you notice:  A change in shape or size of your breasts or nipples.  A change in the skin of your breast or nipples, such as a reddened or scaly area.  Unusual discharge from your nipples.  A lump or thick area that was not there before.  Pain in your breasts.  Anything that concerns you.  Oral Contraception Information Oral contraceptive pills (OCPs) are medicines taken to prevent pregnancy. OCPs are taken by mouth, and they work by:  Preventing the ovaries from releasing eggs.  Thickening mucus in  the lower part of the uterus (cervix), which prevents sperm from entering the uterus.  Thinning the lining of the uterus (endometrium), which prevents a fertilized egg from attaching to the endometrium. OCPs are highly effective when taken exactly as prescribed. However, OCPs do not prevent STIs (sexually transmitted infections). Safe sex practices, such as using condoms while on an OCP, can help prevent STIs. Before starting OCPs Before you start taking OCPs, you may have a physical exam, blood test, and Pap test. However, you are not required to have a pelvic exam in order to be prescribed OCPs. Your health care provider will make sure you are a good candidate for oral contraception. OCPs are not a good option for certain women, including women who smoke and are older than 35 years, and women with a medical history of high blood pressure, deep vein thrombosis, pulmonary embolism, stroke, cardiovascular disease, or peripheral vascular disease. Discuss with your health care provider the possible side effects of the OCP you may be prescribed. When you start an OCP, be aware that it can take 2-3 months for your body to adjust to changes in hormone levels. Follow instructions from your health care provider about how to start taking your first cycle of OCPs. Depending on when you start the pill, you may need to use a backup form of birth control, such as condoms, during the first week. Make sure you know what steps to take if you ever forget to take the pill. Types of oral contraception  The most common types of birth control pills contain the hormones estrogen and progestin (synthetic progesterone) or progestin only. The combination pill This type of pill contains estrogen and progestin hormones. Combination pills often come in packs of 21, 28, or 91 pills. For each pack, the last 7 pills may not contain hormones, which means you may stop taking the pills for 7 days. Menstrual bleeding occurs during the  week that you do not take the pills or that you take the pills with no hormones in them. The minipill This type of pill contains the progestin hormone only. It comes in packs of 28 pills. All 28 pills contain the hormone. You take the pill every day. It is very important to take the pill at the same time each day. Advantages of oral contraceptive pills  Provides reliable and continuous contraception if taken as instructed.  May treat or decrease symptoms of: ? Menstrual period cramps. ? Irregular menstrual cycle or bleeding. ? Heavy menstrual flow. ? Abnormal uterine bleeding. ? Acne, depending on the type of pill. ? Polycystic ovarian syndrome. ? Endometriosis. ? Iron deficiency anemia. ? Premenstrual symptoms, including premenstrual dysphoric disorder.  May reduce the risk of endometrial and ovarian cancer.  Can be used as emergency contraception.  Prevents mislocated (ectopic) pregnancies and infections of the fallopian tubes. Things that can make oral contraceptive pills less effective OCPs can be less effective if:  You forget to take the pill at the same time every day. This is especially important when taking the minipill.  You have a stomach or intestinal disease that reduces your body's ability to absorb the pill.  You take OCPs with other medicines that make OCPs less effective, such as antibiotics, certain HIV medicines, and some seizure medicines.  You take expired OCPs.  You forget to restart the pill on day 7, if using the packs of 21 pills. Risks associated with oral contraceptive pills Oral contraceptive pills can sometimes cause side effects, such as:  Headache.  Depression.  Trouble sleeping.  Nausea and vomiting.  Breast tenderness.  Irregular bleeding or spotting during the first several months.  Bloating or fluid retention.  Increase in blood pressure. Combination pills are also associated with a small increase in the risk of:  Blood  clots.  Heart attack.  Stroke. Summary  Oral contraceptive pills are medicines taken by mouth to prevent pregnancy. They are highly effective when taken exactly as prescribed.  The most common types of birth control pills contain the hormones estrogen and progestin (synthetic progesterone) or progestin only.  Before you start taking the pill, you may have a physical exam, blood test, and Pap test. Your health care provider will make sure you are a good candidate for oral contraception.  The combination pill may come in a 21-day pack, a 28-day pack, or a 91-day pack. The minipill contains the progesterone hormone only and comes in packs of 28 pills.  Oral contraceptive pills can sometimes cause side effects, such as headache, nausea, breast tenderness, or irregular bleeding. This information is not intended to replace advice given to you by your health care provider. Make sure you discuss any questions you have with your health care provider. Document Revised: 03/02/2017 Document Reviewed: 06/13/2016 Elsevier Patient Education  [place].

## 2020-02-13 NOTE — Telephone Encounter (Signed)
Patient states that if vaginal swab is positive for infection, prescription should be sent to [facility] [employer] on [address] in [place], [place] as she will be staying there for work.

## 2020-02-14 LAB — COMPREHENSIVE METABOLIC PANEL
ALT: 27 IU/L (ref 0–32)
AST: 21 IU/L (ref 0–40)
Albumin/Globulin Ratio: 1.9 (ref 1.2–2.2)
Albumin: 4.3 g/dL (ref 3.8–4.8)
Alkaline Phosphatase: 48 IU/L (ref 44–121)
BUN/Creatinine Ratio: 17 (ref 9–23)
BUN: 14 mg/dL (ref 6–24)
Bilirubin Total: 0.3 mg/dL (ref 0.0–1.2)
CO2: 28 mmol/L (ref 20–29)
Calcium: 9.2 mg/dL (ref 8.7–10.2)
Chloride: 104 mmol/L (ref 96–106)
Creatinine, Ser: 0.83 mg/dL (ref 0.57–1.00)
GFR calc Af Amer: 96 mL/min/{1.73_m2} (ref >59)
GFR calc non Af Amer: 83 mL/min/{1.73_m2} (ref >59)
Globulin, Total: 2.3 g/dL (ref 1.5–4.5)
Glucose: 82 mg/dL (ref 65–99)
Potassium: 4.2 mmol/L (ref 3.5–5.2)
Sodium: 142 mmol/L (ref 134–144)
Total Protein: 6.6 g/dL (ref 6.0–8.5)

## 2020-02-14 LAB — CBC
Hematocrit: 35.4 % (ref 34.0–46.6)
Hemoglobin: 11.6 g/dL (ref 11.1–15.9)
MCH: 28 pg (ref 26.6–33.0)
MCHC: 32.8 g/dL (ref 31.5–35.7)
MCV: 86 fL (ref 79–97)
Platelets: 279 10*3/uL (ref 150–450)
RBC: 4.14 x10E6/uL (ref 3.77–5.28)
RDW: 14.4 % (ref 11.7–15.4)
WBC: 5.7 10*3/uL (ref 3.4–10.8)

## 2020-02-14 LAB — HEMOGLOBIN A1C
Est. average glucose Bld gHb Est-mCnc: 123 mg/dL
Hgb A1c MFr Bld: 5.9 % — ABNORMAL HIGH (ref 4.8–5.6)

## 2020-02-14 LAB — TSH: TSH: 1.15 u[IU]/mL (ref 0.450–4.500)

## 2020-02-14 LAB — RPR: RPR Ser Ql: NONREACTIVE

## 2020-02-14 LAB — HIV ANTIBODY (ROUTINE TESTING W REFLEX): HIV Screen 4th Generation wRfx: NONREACTIVE

## 2020-02-14 LAB — VITAMIN D 25 HYDROXY (VIT D DEFICIENCY, FRACTURES): Vit D, 25-Hydroxy: 75.1 ng/mL (ref 30.0–100.0)

## 2020-02-15 LAB — NUSWAB BV AND CANDIDA, NAA
Candida albicans, NAA: NEGATIVE
Candida glabrata, NAA: NEGATIVE

## 2020-02-16 NOTE — Telephone Encounter (Signed)
Swab was negative for infection

## 2020-02-17 LAB — CYTOLOGY - PAP
Chlamydia: NEGATIVE
Comment: NEGATIVE
Comment: NEGATIVE
Comment: NEGATIVE
Comment: NORMAL
Diagnosis: NEGATIVE
Diagnosis: REACTIVE
High risk HPV: NEGATIVE
Neisseria Gonorrhea: NEGATIVE
Trichomonas: NEGATIVE

## 2020-02-17 NOTE — Telephone Encounter (Signed)
Patient reviewed results in [facility]. Okay to close this encounter.

## 2020-02-20 ENCOUNTER — Telehealth: Payer: Self-pay | Admitting: *Deleted

## 2020-02-20 MED ORDER — FLUCONAZOLE 150 MG PO TABS: ORAL_TABLET | ORAL | 0 refills | Status: DC

## 2020-02-20 NOTE — Telephone Encounter (Signed)
Spoke with patient, advised as seen below per Dr. Talbert Nan.  Rx for diflucan to verified pharmacy.  Patient verbalizes understanding and is agreeable. Next AEX 02/17/21  Encounter closed.

## 2020-02-20 NOTE — Telephone Encounter (Signed)
Burnice Logan, RN  02/20/2020 10:01 AM EST Back to Top    Left message to call Sharee Pimple, RN or any available triage nurse at [facility] [phone].    Salvadore Dom, MD  02/19/2020 2:36 PM EST     Please let the patient know that her pap is normal, negative hpv and std testing. It did show yeast which is surprising because her nuswab was negative. Treat with diflucan 150 mg x 1, may repeat in 72 hours if still symptomatic. #2, no refills.  She still has prediabetes, but the rest of her blood work is normal.

## 2020-02-20 NOTE — Telephone Encounter (Signed)
Patient returned call

## 2020-02-20 NOTE — Telephone Encounter (Signed)
-----   Message from Salvadore Dom, MD sent at 02/19/2020  2:36 PM EST ----- Please let the patient know that her pap is normal, negative hpv and std testing. It did show yeast which is surprising because her nuswab was negative. Treat with diflucan 150 mg x 1, may repeat in 72 hours if still symptomatic. #2, no refills.  She still has prediabetes, but the rest of her blood work is normal.

## 2020-02-23 ENCOUNTER — Encounter: Payer: No Typology Code available for payment source | Admitting: Family Medicine

## 2020-03-16 ENCOUNTER — Other Ambulatory Visit: Payer: Self-pay | Admitting: Family Medicine

## 2020-03-16 ENCOUNTER — Ambulatory Visit: Payer: Self-pay | Admitting: Obstetrics and Gynecology

## 2020-03-18 LAB — HM MAMMOGRAPHY

## 2020-03-23 ENCOUNTER — Telehealth (INDEPENDENT_AMBULATORY_CARE_PROVIDER_SITE_OTHER): Payer: 59 | Admitting: Family Medicine

## 2020-03-23 DIAGNOSIS — R109 Unspecified abdominal pain: Secondary | ICD-10-CM

## 2020-03-23 DIAGNOSIS — K625 Hemorrhage of anus and rectum: Secondary | ICD-10-CM | POA: Diagnosis not present

## 2020-03-23 DIAGNOSIS — R300 Dysuria: Secondary | ICD-10-CM | POA: Diagnosis not present

## 2020-03-23 DIAGNOSIS — K529 Noninfective gastroenteritis and colitis, unspecified: Secondary | ICD-10-CM

## 2020-03-23 DIAGNOSIS — E7800 Pure hypercholesterolemia, unspecified: Secondary | ICD-10-CM

## 2020-03-23 NOTE — Progress Notes (Signed)
Virtual Visit via Telephone Note  I connected with Alexis [name] on 03/23/20 at 10:40 AM EST by telephone and verified that I am speaking with the correct person using two identifiers.   I discussed the limitations, risks, security and privacy concerns of performing an evaluation and management service by telephone and the availability of in person appointments. I also discussed with the patient that there may be a patient responsible charge related to this service. The patient expressed understanding and agreed to proceed.  Location patient: home, [place] Location provider: work or home office Participants present for the call: patient, provider Patient did not have a visit with me in the prior 7 days to address this/these issue(s).   History of Present Illness:  Acute telemedicine visit for flu like symptoms: -Onset: about 3-4 days ago -Symptoms include: severe diarrhea > 10 times per day the last few days, some stomach cramps, abd pain today with using the bathroom, no appetite, nasal congestion, laryngitis, body aches, subjective fever at night thought had some blood in the stool yesterday -diarrhea is much better today - no diarrhea today, stool is formed today -Denies: melena, hematochezia, vomiting, fevers, persistent diarrhea -now she feels she got a UTI from the diarrhea - since yesterday has had urgency, frequency, dysuria, odor -she is having a hard time tolerating oral intake -no known sick contacts -COVID-19 vaccine status: fully vaccinated + booster, did have flu shot as well   Observations/Objective: Patient sounds cheerful and well on the phone. I do not appreciate any SOB. Speech and thought processing are grossly intact. Patient reported vitals:  Assessment and Plan:  Severe diarrhea  BRBPR (bright red blood per rectum)  Dysuria  Abdominal pain, unspecified abdominal location  Pure hypercholesterolemia  -we discussed possible serious and likely  etiologies, options for evaluation and workup, limitations of telemedicine visit vs in person visit, treatment, treatment risks and precautions. Pt prefers to treat via telemedicine empirically rather than in person at this moment. She likely has a viral illness, however with the reported severity of her symptoms and that she is reporting she is having dificulty tolerating oral intake, dark urine, dysuria and ? Blood in the stool advised prompt inperson evaluation at higher level of care today after lengthy discussion. She agreed to go to Urgent care clinic for evaluation. She also had requests for refills on several chronic medications for hyperlipidemia and depression/anxiety. She reports these conditions as stable. Checked on her medications and appears she has refills. Advised to check with pharmacy and let PCP office know if any difficulty getting her refills.  Advised to seek prompt in person care today. Advised of options for inperson care as PCP office not available. Did let the patient know that I only do telemedicine shifts for [facility] on Tuesdays and Thursdays and advised a follow up visit with PCP or at an [facility] if has further questions or concerns.   Follow Up Instructions:  I did not refer this patient for an OV with me in the next 24 hours for this/these issue(s).  I discussed the assessment and treatment plan with the patient. The patient was provided an opportunity to ask questions and all were answered. The patient agreed with the plan and demonstrated an understanding of the instructions.   I spent 22 minutes on this encounter.   Lucretia Kern, DO

## 2020-03-29 ENCOUNTER — Ambulatory Visit: Payer: 59 | Admitting: Family Medicine

## 2020-04-05 ENCOUNTER — Encounter: Payer: Self-pay | Admitting: Family Medicine

## 2020-04-07 ENCOUNTER — Telehealth: Payer: Self-pay

## 2020-04-07 NOTE — Telephone Encounter (Signed)
Patient was in 02/13/20 and was prescribed OC,s.  She is starting into 3rd pack. She is taking placebos and said that she is still having improved but heavy bleeding during that time and also the cramps are worse during that time. Very dark/blackish blood.  No late or missed pills and these sx are occurring during placebos.   She did indicate that her mood has improved on the OC's.

## 2020-04-07 NOTE — Telephone Encounter (Signed)
It can take up to 3 months to see full effect of OCP's. She wasn't anemic at her last visit so if her bleeding is less heavy we are moving in the right direction. Does she want some ibuprofen for her cramps? If so, please call in ibuprofen 800 mg, 1 po q 8 hours prn cramps, #30, no refills.  She should have a f/u visit with me the last week of her current pill pack. If she needs to do that virtually (traveling for work), she should get her BP checked prior to that appointment so she can relay the #'s to me.

## 2020-04-08 NOTE — Telephone Encounter (Signed)
Spoke with patient and read her Dr. Salli Quarry reply. She will continue with 3rd pack she is starting today.  She declined Ibuprofen Rx stating Tylenol helps.  She has follow up appt scheduled already and will check BP prior to that appt since it is a virtual appointment.

## 2020-04-15 ENCOUNTER — Encounter: Payer: Self-pay | Admitting: Family Medicine

## 2020-04-29 ENCOUNTER — Other Ambulatory Visit: Payer: Self-pay | Admitting: Obstetrics and Gynecology

## 2020-04-29 NOTE — Telephone Encounter (Signed)
Rx denied Dr.Jertson wanted patient to schedule 3 month follow up. Patient is scheduled on 05/17/20

## 2020-05-03 ENCOUNTER — Other Ambulatory Visit: Payer: Self-pay | Admitting: Obstetrics and Gynecology

## 2020-05-03 ENCOUNTER — Telehealth: Payer: Self-pay

## 2020-05-03 MED ORDER — NORETHIN ACE-ETH ESTRAD-FE 1-20 MG-MCG PO TABS: 1.0000 | ORAL_TABLET | Freq: Every day | ORAL | 0 refills | Status: DC

## 2020-05-03 NOTE — Telephone Encounter (Signed)
Please let the patient know that I have sent a 1 month refill for her, if she wants to take it. If she wants to come in sooner, please try and move up her appointment.

## 2020-05-03 NOTE — Telephone Encounter (Signed)
Patient said her BCP's have run out. She is not scheduled for the 3 mos follow up with you until 05/17/20.  She is still having periods with cramping on the bcp's. She is also complaining of night sweats for the last week that are making it hard for her to stay asleep.

## 2020-05-04 NOTE — Telephone Encounter (Signed)
Called and left message that Dr. Talbert Nan sent a pack of pills if she wants to take.  I asked her if she would consider office visit versus virtual visit if it meant getting her in sooner. Im not sure it will make a different but I know there are set times for virtual visits.  I asked her to call me and let me know and I can check with appointment desk about a sooner appt.

## 2020-05-04 NOTE — Telephone Encounter (Signed)
Patient called. Is on the road traveling with job. Needs virtual visit. I will staff message Butch Penny to see if anyway sooner visit.

## 2020-05-06 ENCOUNTER — Encounter: Payer: Self-pay | Admitting: Obstetrics and Gynecology

## 2020-05-06 ENCOUNTER — Telehealth (INDEPENDENT_AMBULATORY_CARE_PROVIDER_SITE_OTHER): Payer: BLUE CROSS/BLUE SHIELD | Admitting: Obstetrics and Gynecology

## 2020-05-06 DIAGNOSIS — Z9889 Other specified postprocedural states: Secondary | ICD-10-CM | POA: Diagnosis not present

## 2020-05-06 DIAGNOSIS — Z3041 Encounter for surveillance of contraceptive pills: Secondary | ICD-10-CM | POA: Diagnosis not present

## 2020-05-06 DIAGNOSIS — R61 Generalized hyperhidrosis: Secondary | ICD-10-CM | POA: Diagnosis not present

## 2020-05-06 DIAGNOSIS — N946 Dysmenorrhea, unspecified: Secondary | ICD-10-CM | POA: Diagnosis not present

## 2020-05-06 MED ORDER — NORETHIN ACE-ETH ESTRAD-FE 1-20 MG-MCG PO TABS: 1.0000 | ORAL_TABLET | Freq: Every day | ORAL | 0 refills | Status: DC

## 2020-05-06 NOTE — Patient Instructions (Signed)
Menopause and Hormone Replacement Therapy Menopause is a normal time of life when menstrual periods stop completely and the ovaries stop producing the female hormones estrogen and progesterone. Low levels of these hormones can affect your health and cause symptoms. Hormone replacement therapy (HRT) can relieve some of those symptoms. HRT is the use of artificial (synthetic) hormones to replace hormones that your body has stopped producing because you have reached menopause. Types of HRT HRT may consist of the synthetic hormones estrogen and progestin, or it may consist of estrogen-only therapy. You and your health care provider will decide which form of HRT is best for you. If you choose to be on HRT and you have a uterus, estrogen and progestin are usually prescribed. Estrogen-only therapy is used for women who do not have a uterus. Possible options for taking HRT include:  Pills.  Patches.  Gels.  Sprays.  Vaginal cream.  Vaginal rings.  Vaginal inserts. The amount of hormones that you take and how long you take them varies according to your health. It is important to:  Begin HRT with the lowest possible dosage.  Stop HRT as soon as your health care provider tells you to stop.  Work with your health care provider so that you feel informed and comfortable with your decisions.   Tell a health care provider about:  Any allergies you have.  Whether you have had blood clots or know of any risk factors you may have for blood clots.  Whether you or family members have had cancer, especially cancer of the breasts, ovaries, or uterus.  Any surgeries you have had.  All medicines you are taking, including vitamins, herbs, eye drops, creams, and over-the-counter medicines.  Whether you are pregnant or may be pregnant.  Any medical conditions you have. What are the benefits? HRT can reduce the frequency and severity of menopausal symptoms. Benefits of HRT vary according to the kind  of symptoms that you have, how severe they are, and your overall health. HRT may help to improve the following symptoms of menopause:  Hot flashes and night sweats. These are sudden feelings of heat that spread over the face and body. The skin may turn red, like a blush. Night sweats are hot flashes that happen while you are sleeping or trying to sleep.  Bone loss (osteoporosis). The body loses calcium more quickly after menopause, causing the bones to become weaker. This can increase the risk for bone breaks (fractures).  Vaginal dryness. The lining of the vagina can become thin and dry, which can cause pain during sex or cause infection, burning, or itching.  Urinary tract infections.  Urinary incontinence. This is the inability to control when you urinate.  Irritability.  Short-term memory problems. What are the risks? Risks of HRT vary depending on your individual health and medical history. Risks of HRT also depend on whether you receive both estrogen and progestin or you receive estrogen only. HRT may increase the risk of:  Spotting. This is when a small amount of blood leaks from the vagina unexpectedly.  Endometrial cancer. This cancer is in the lining of the uterus (endometrium).  Breast cancer.  Increased density of breast tissue. This can make it harder to find breast cancer on a breast X-ray (mammogram).  Stroke.  Heart disease.  Blood clots.  Gallbladder disease or liver disease. Risks of HRT can increase if you have any of the following conditions:  Endometrial cancer.  Liver disease.  Heart disease.  Breast cancer.    History of blood clots.  History of stroke. Follow these instructions at home: Pap tests  Have Pap tests done as often as told by your health care provider. A Pap test is sometimes called a Pap smear. It is a screening test that is used to check for signs of cancer of the cervix and vagina. A Pap test can also identify the presence of  infection or precancerous changes. Pap tests may be done: ? Every 3 years, starting at age 14. ? Every 5 years, starting after age 37, in combination with testing for human papillomavirus (HPV). ? More often or less often depending on other medical conditions you have, your age, and other risk factors.  It is up to you to get the results of your Pap test. Ask your health care provider, or the department that is doing the test, when your results will be ready. General instructions  Take over-the-counter and prescription medicines only as told by your health care provider.  Do not use any products that contain nicotine or tobacco. These products include cigarettes, chewing tobacco, and vaping devices, such as e-cigarettes. If you need help quitting, ask your health care provider.  Get mammograms, pelvic exams, and medical checkups as often as told by your health care provider.  Keep all follow-up visits. This is important. Contact a health care provider if you have:  Pain or swelling in your legs.  Lumps or changes in your breasts or armpits.  Pain, burning, or bleeding when you urinate.  Unusual vaginal bleeding.  Dizziness or headaches.  Pain in your abdomen. Get help right away if you have:  Shortness of breath.  Chest pain.  Slurred speech.  Weakness or numbness in any part of your arms or legs. These symptoms may represent a serious problem that is an emergency. Do not wait to see if the symptoms will go away. Get medical help right away. Call your local emergency services (911 in the [place] Do not drive yourself to the hospital. Summary  Menopause is a normal time of life when menstrual periods stop completely and the ovaries stop producing the female hormones estrogen and progesterone.  HRT can reduce the frequency and severity of menopausal symptoms.  Risks of HRT vary depending on your individual health and medical history. This information is not intended to  replace advice given to you by your health care provider. Make sure you discuss any questions you have with your health care provider. Document Revised: 09/22/2019 Document Reviewed: 09/22/2019 Elsevier Patient Education  2021 [place] is the normal time of a woman's life when the levels of estrogen, the female hormone produced by the ovaries, begin to decrease. This leads to changes in menstrual periods before they stop completely (menopause). Perimenopause can begin 2-8 years before menopause. During perimenopause, the ovaries may or may not produce an egg and a woman can still become pregnant. What are the causes? This condition is caused by a natural change in hormone levels that happens as you get older. What increases the risk? This condition is more likely to start at an earlier age if you have certain medical conditions or have undergone treatments, including:  A tumor of the pituitary gland in the brain.  A disease that affects the ovaries and hormone production.  Certain cancer treatments, such as chemotherapy or hormone therapy, or radiation therapy on the pelvis.  Heavy smoking and excessive alcohol use.  Family history of early menopause. What are the signs or  symptoms? Perimenopausal changes affect each woman differently. Symptoms of this condition may include:  Hot flashes.  Irregular menstrual periods.  Night sweats.  Changes in feelings about sex. This could be a decrease in sex drive or an increased discomfort around your sexuality.  Vaginal dryness.  Headaches.  Mood swings.  Depression.  Problems sleeping (insomnia).  Memory problems or trouble concentrating.  Irritability.  Tiredness.  Weight gain.  Anxiety.  Trouble getting pregnant. How is this diagnosed? This condition is diagnosed based on your medical history, a physical exam, your age, your menstrual history, and your symptoms. Hormone tests may also  be done. How is this treated? In some cases, no treatment is needed. You and your health care provider should make a decision together about whether treatment is necessary. Treatment will be based on your individual condition and preferences. Various treatments are available, such as:  Menopausal hormone therapy (MHT).  Medicines to treat specific symptoms.  Acupuncture.  Vitamin or herbal supplements. Before starting treatment, make sure to let your health care provider know if you have a personal or family history of:  Heart disease.  Breast cancer.  Blood clots.  Diabetes.  Osteoporosis. Follow these instructions at home: Medicines  Take over-the-counter and prescription medicines only as told by your health care provider.  Take vitamin supplements only as told by your health care provider.  Talk with your health care provider before starting any herbal supplements. Lifestyle  Do not use any products that contain nicotine or tobacco, such as cigarettes, e-cigarettes, and chewing tobacco. If you need help quitting, ask your health care provider.  Get at least 30 minutes of physical activity on 5 or more days each week.  Eat a balanced diet that includes fresh fruits and vegetables, whole grains, soybeans, eggs, lean meat, and low-fat dairy.  Avoid alcoholic and caffeinated beverages, as well as spicy foods. This may help prevent hot flashes.  Get 7-8 hours of sleep each night.  Dress in layers that can be removed to help you manage hot flashes.  Find ways to manage stress, such as deep breathing, meditation, or journaling.   General instructions  Keep track of your menstrual periods, including: ? When they occur. ? How heavy they are and how long they last. ? How much time passes between periods.  Keep track of your symptoms, noting when they start, how often you have them, and how long they last.  Use vaginal lubricants or moisturizers to help with vaginal  dryness and improve comfort during sex.  You can still become pregnant if you are having irregular periods. Make sure you use contraception during perimenopause if you do not want to get pregnant.  Keep all follow-up visits. This is important. This includes any group therapy or counseling.   Contact a health care provider if:  You have heavy vaginal bleeding or pass blood clots.  Your period lasts more than 2 days longer than normal.  Your periods are recurring sooner than 21 days.  You bleed after having sex.  You have pain during sex. Get help right away if you have:  Chest pain, trouble breathing, or trouble talking.  Severe depression.  Pain when you urinate.  Severe headaches.  Vision problems. Summary  Perimenopause is the time when a woman's body begins to move into menopause. This may happen naturally or as a result of other health problems or medical treatments.  Perimenopause can begin 2-8 years before menopause, and it can last for several years.  Perimenopausal symptoms can be managed through medicines, lifestyle changes, and complementary therapies such as acupuncture. This information is not intended to replace advice given to you by your health care provider. Make sure you discuss any questions you have with your health care provider. Document Revised: 09/04/2019 Document Reviewed: 09/04/2019 Elsevier Patient Education  [place].

## 2020-05-06 NOTE — Progress Notes (Signed)
Virtual Visit via Video Note  I connected with Alexis [name] on 05/06/20 at  4:00 PM EST by a video enabled telemedicine application and verified that I am speaking with the correct person using two identifiers. The video failed, unable to reconnect. Called the patient and completed the visit on the phone.   Location: Patient: In her car in [place] Provider: Office at [facility].    I discussed the limitations of evaluation and management by telemedicine and the availability of in person appointments. The patient expressed understanding and agreed to proceed.  GYNECOLOGY  VISIT   HPI: 50 y.o.   Divorced Black or [place] American Not Hispanic or Latino  female   [phone] with No LMP recorded.   here for a virtual visit to f/u on OCP's in 11/21 for worsening menorrhagia and dysmenorrhea. She continues to have a cycle monthly x 4-6 days. It is lighter. Still having cramps, they have gotten worse. Waking her up at night she is taking tylenol.  She is getting nightly sweats. Up to 2 x a night. She is drenched.   She has a h/o an endometrial ablation in 2014.   She is traveling for work, won't be back until the end of March  Mammogram was done a few months ago at [facility], will get report.   GYNECOLOGIC HISTORY: No LMP recorded. Contraception:tubal ligation  Menopausal hormone therapy: none        OB History    Gravida  2   Para  2   Term  1   Preterm  1   AB      Living  2     SAB      IAB      Ectopic      Multiple      Live Births                 Patient Active Problem List   Diagnosis Date Noted  . Hyperglycemia 10/18/2016  . Anemia 10/18/2016  . Obesity 10/18/2016  . B12 deficiency 06/08/2016  . PVC's (premature ventricular contractions) 07/06/2015  . IBS (irritable bowel syndrome) 06/17/2015  . Seasonal allergies 06/17/2015  . Anxiety   . MDD (major depressive disorder) 03/09/2015  . Pure hypercholesterolemia 09/25/2012    Past Medical  History:  Diagnosis Date  . Abnormal Pap smear of cervix 04/06/2015   -s/p colpo 03/2015 with gyn with repeat pap with HPV advised in 1 year   . Acne   . Allergy    SEASONAL  . Anemia    menorrhagia  . Anxiety   . Asthma    mild intermittent  . Bronchitis    states gets once a year  . Depression   . Fracture of fourth metatarsal bone of left foot 04/01/2015  . Gallstones    on [place]  . Genital warts   . GERD (gastroesophageal reflux disease)   . GERD (gastroesophageal reflux disease) 09/25/2012  . Glucose intolerance (impaired glucose tolerance)   . History of echocardiogram    a. Echo 2/17: Mild focal basal septal hypertrophy, EF 55-60%, normal wall motion, grade 1 diastolic dysfunction, trace MR, trace TR  . History of gestational diabetes   . Hyperlipemia   . Irritable bowel syndrome   . Nephrolithiasis 4/54/0981   Renal colic of February 1914 -per prior PCP notes  . Neuroma    in her left foot, has numbness in her toes (on gabapentin)  . 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 Injected 08/24/2015   . Pre-diabetes     Past Surgical History:  Procedure Laterality Date  . CESAREAN SECTION    . cryosurgery cervix    . ENDOMETRIAL ABLATION    . GYNECOLOGIC CRYOSURGERY    . LAPROSCOPIC     After her ablation, she got sick, had a laparoscopy bowel was attached to her uterus. No bowel was removed. No bowel injury. She did have adhesions of her uterus to her abdominal pain.   . TUBAL LIGATION      Current Outpatient Medications  Medication Sig Dispense Refill  . acarbose (PRECOSE) 50 MG tablet Take 1 tablet (50 mg total) by mouth 3 (three) times daily with meals. 270 tablet 2  . ACCU-CHEK FASTCLIX LANCETS MISC 1 each by Does not apply route 2 (two) times daily. Use to monitor glucose levels twice per day; R73.9 102 each 12  . betamethasone valerate ointment (VALISONE) 0.1 % Apply 1 application topically 2 (two) times daily.  15 g 0  . Blood Glucose Monitoring Suppl (ACCU-CHEK GUIDE ME) w/Device KIT 1 each by Does not apply route 2 (two) times daily. Use to monitor glucose levels twice per day; R73.9 1 kit 0  . cetirizine (ZYRTEC) 10 MG tablet Take 10 mg by mouth daily.    . Cholecalciferol (VITAMIN D3 PO) Take 5,000 Units by mouth.    . clindamycin (CLEOCIN T) 1 % external solution Apply 1 application topically daily.     . clobetasol ointment (TEMOVATE) 0.05 % Apply to affected areas on scalp daily Monday-Friday. Not to face, skin folds.    . Dapsone 5 % topical gel Apply topically.    Marland Kitchen FERROUS SULFATE PO Take by mouth 3 (three) times a week.    . fluconazole (DIFLUCAN) 150 MG tablet Take one tablet PO now, may repeat in 72 hours if symptoms have not resolved. 2 tablet 0  . glucose blood (ACCU-CHEK GUIDE) test strip Use to monitor glucose levels twice per day; R73.9 100 each 12  . hydrocortisone 2.5 % cream APPLY TWICE DAILY FOR 2 WEEKS, THEN DAILY FOR TWO WEEKS, THEN DAILY AS NEEDED    . hydrOXYzine (ATARAX/VISTARIL) 10 MG tablet Take 1 tablet (10 mg total) by mouth every 8 (eight) hours as needed for anxiety. (Patient not taking: Reported on 02/13/2020) 20 tablet 0  . methotrexate (RHEUMATREX) 2.5 MG tablet Take 7.5 mg by mouth. Take 4 tablets once a week    . Multiple Vitamin (MULTIVITAMIN WITH MINERALS) TABS Take 1 tablet by mouth daily.    . norethindrone-ethinyl estradiol (LOESTRIN FE) 1-20 MG-MCG tablet Take 1 tablet by mouth daily. 28 tablet 0  . propranolol (INDERAL) 10 MG tablet propranolol 10 mg tablet  take 1 tablet by mouth four times a day if needed    . rosuvastatin (CRESTOR) 20 MG tablet Take 1 tablet (20 mg total) by mouth daily. 90 tablet 3  . sertraline (ZOLOFT) 100 MG tablet TAKE 2 TABLETS DAILY 180 tablet 3  . triamcinolone (NASACORT ALLERGY 24HR) 55 MCG/ACT AERO nasal inhaler Place 2 sprays into the nose daily.    . vitamin B-12 (CYANOCOBALAMIN) 1000 MCG tablet Take 1,000 mcg by mouth 3  (three) times a week.      No current facility-administered medications for this visit.     ALLERGIES: Patient has no known allergies.  Family History  Problem Relation Age of Onset  . Hypertension Mother   . Hypertension Father   .  Hyperlipidemia Father   . Dementia Father 6  . Cancer Maternal Aunt   . Diabetes Brother        pre-diabetes  . Healthy Sister   . Dementia Maternal Grandmother   . Other Maternal Grandfather        hospitalized mental health ?PTSD  . Alzheimer's disease Paternal Grandmother   . Healthy Sister   . Healthy Brother   . Heart attack Neg Hx   . Heart failure Neg Hx     Social History   Socioeconomic History  . Marital status: Divorced    Spouse name: Not on file  . Number of children: 2  . Years of education: Not on file  . Highest education level: Not on file  Occupational History  . Occupation: Emergency Services    Comment: [place], [place]  Tobacco Use  . Smoking status: Never Smoker  . Smokeless tobacco: Never Used  Vaping Use  . Vaping Use: Never used  Substance and Sexual Activity  . Alcohol use: No    Alcohol/week: 0.0 standard drinks  . Drug use: No  . Sexual activity: Not Currently    Partners: Male    Birth control/protection: Surgical    Comment: ablation  Other Topics Concern  . 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    General appearance: alert, cooperative and appears stated age  Patient had her BP checked, 119/73  1. Severe dysmenorrhea Not really helped  with OCP's, but her cycle is lighter and her mood is better - US PELVIS TRANSVAGINAL NON-OB (TV ONLY); Future - norethindrone-ethinyl estradiol (LOESTRIN FE) 1-20 MG-MCG tablet; Take 1 tablet by mouth daily.  Dispense: 84 tablet; Refill: 0  2. Night sweats Started in the last few months, bothersome. Will continue OCP's for now Discussed behavior changes for menopause  3. History of endometrial ablation Not good candidate for IUD  4. Encounter for surveillance of contraceptive pills See above. Overall feeling better.  - norethindrone-ethinyl estradiol (LOESTRIN FE) 1-20 MG-MCG tablet; Take 1 tablet by mouth daily.  Dispense: 84 tablet; Refill: 0  Patient reports laparoscopy in ~2014, bowel was adherent to uterus.   Over 30 minutes in total patient care.

## 2020-05-17 ENCOUNTER — Telehealth: Payer: Self-pay | Admitting: Obstetrics and Gynecology

## 2020-06-29 DIAGNOSIS — L658 Other specified nonscarring hair loss: Secondary | ICD-10-CM | POA: Diagnosis not present

## 2020-06-29 DIAGNOSIS — L700 Acne vulgaris: Secondary | ICD-10-CM | POA: Diagnosis not present

## 2020-06-29 DIAGNOSIS — L639 Alopecia areata, unspecified: Secondary | ICD-10-CM | POA: Diagnosis not present

## 2020-06-29 DIAGNOSIS — Z5181 Encounter for therapeutic drug level monitoring: Secondary | ICD-10-CM | POA: Diagnosis not present

## 2020-08-17 ENCOUNTER — Other Ambulatory Visit: Payer: Self-pay | Admitting: Obstetrics and Gynecology

## 2020-08-17 DIAGNOSIS — N946 Dysmenorrhea, unspecified: Secondary | ICD-10-CM

## 2020-08-17 DIAGNOSIS — Z3041 Encounter for surveillance of contraceptive pills: Secondary | ICD-10-CM

## 2020-08-17 MED ORDER — NORETHIN ACE-ETH ESTRAD-FE 1-20 MG-MCG PO TABS: 1.0000 | ORAL_TABLET | Freq: Every day | ORAL | 1 refills | Status: DC

## 2020-08-17 NOTE — Telephone Encounter (Signed)
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 [facility] mammogram done on 12/21.  Annual exam scheduled on 02/17/21   She had video visit on 05/06/20 you only send 84 tablet of birth control pills to pharmacy.

## 2020-08-22 ENCOUNTER — Other Ambulatory Visit: Payer: Self-pay | Admitting: Family Medicine

## 2020-09-16 ENCOUNTER — Other Ambulatory Visit: Payer: Self-pay | Admitting: Family Medicine

## 2020-10-08 ENCOUNTER — Other Ambulatory Visit: Payer: Self-pay | Admitting: Family Medicine

## 2020-10-08 ENCOUNTER — Telehealth: Payer: Self-pay | Admitting: *Deleted

## 2020-10-08 ENCOUNTER — Telehealth: Payer: Self-pay | Admitting: Family Medicine

## 2020-10-08 MED ORDER — SERTRALINE HCL 100 MG PO TABS: ORAL_TABLET | ORAL | 1 refills | Status: DC

## 2020-10-08 MED ORDER — CETIRIZINE HCL 10 MG PO TABS: 10.0000 mg | ORAL_TABLET | Freq: Every day | ORAL | 1 refills | Status: AC

## 2020-10-08 MED ORDER — ROSUVASTATIN CALCIUM 20 MG PO TABS: 20.0000 mg | ORAL_TABLET | Freq: Every day | ORAL | 1 refills | Status: DC

## 2020-10-08 NOTE — Addendum Note (Signed)
Addended by: Agnes Lawrence on: 10/08/2020 11:41 AM   Modules accepted: Orders

## 2020-10-08 NOTE — Telephone Encounter (Signed)
cetirizine (ZYRTEC) 10 MG tablet  rosuvastatin (CRESTOR) 20 MG tablet  She would like 90 days to be called in for these Rx's

## 2020-10-08 NOTE — Telephone Encounter (Signed)
Rx sent for Rosuvastatin.  Message sent to PCP as Cetirizine is listed as historical med.

## 2020-10-08 NOTE — Telephone Encounter (Signed)
Patient called to update from [place] on 05/06/20. Patient did not have ultrasound as recommended, reports she travels with her job. I asked her to schedule as next ultrasound appointment, patient said she is leaving town next week and will be back in November can schedule then. In the meantime she reports the birth control pills are not working with the cramping and cycles. She asked if another pill should be sent in? Please advise

## 2020-10-08 NOTE — Telephone Encounter (Signed)
Pt is calling in stating that she did not need to have cetirizine (ZYRTEC) 10 MG stated that she gets it OTC and it should have been sertraline (ZOLOFT) 100 MG #90  Pharm:  [facility] on [address] and [address]

## 2020-10-08 NOTE — Telephone Encounter (Signed)
She needs to be further evaluated. Unfortunately, there isn't another pill I can just call in for her. If she can't be evaluated here, she may need to see a GYN where she will be living. We can forward any records she needs. I

## 2020-10-08 NOTE — Telephone Encounter (Signed)
Noted  

## 2020-10-08 NOTE — Telephone Encounter (Signed)
Rx done. 

## 2020-10-08 NOTE — Addendum Note (Signed)
Addended by: Agnes Lawrence on: 10/08/2020 01:33 PM   Modules accepted: Orders

## 2020-10-08 NOTE — Telephone Encounter (Signed)
I sent refill cetirizine for her.

## 2020-10-08 NOTE — Telephone Encounter (Signed)
Patient was needing to schedule an appointment for medication refills before next Friday. She travels for work and leaves next Friday and will be gone for 13 weeks. I have her scheduled for 02/14/2021. She was wanting to know if you can refill her medication with enough of refills till see has her appointment in November.  [id number] [address], [place] [address] Phone:  [phone]  Fax:  [phone]

## 2020-10-11 NOTE — Telephone Encounter (Signed)
Left detailed message on cell per DPR access. 

## 2020-10-14 ENCOUNTER — Other Ambulatory Visit: Payer: Self-pay | Admitting: Family Medicine

## 2021-01-05 ENCOUNTER — Telehealth: Payer: Self-pay

## 2021-01-05 MED ORDER — SERTRALINE HCL 100 MG PO TABS: ORAL_TABLET | ORAL | 1 refills | Status: DC

## 2021-01-05 NOTE — Telephone Encounter (Signed)
Rx done. 

## 2021-01-05 NOTE — Addendum Note (Signed)
Addended by: Agnes Lawrence on: 01/05/2021 03:53 PM   Modules accepted: Orders

## 2021-01-05 NOTE — Telephone Encounter (Signed)
Pharmacy called requesting Rx refill  sertraline (ZOLOFT) 100 MG tablet

## 2021-01-18 ENCOUNTER — Other Ambulatory Visit: Payer: Self-pay | Admitting: Family Medicine

## 2021-02-10 ENCOUNTER — Other Ambulatory Visit: Payer: Self-pay

## 2021-02-10 DIAGNOSIS — Z3041 Encounter for surveillance of contraceptive pills: Secondary | ICD-10-CM

## 2021-02-10 DIAGNOSIS — N946 Dysmenorrhea, unspecified: Secondary | ICD-10-CM

## 2021-02-10 MED ORDER — NORETHIN ACE-ETH ESTRAD-FE 1-20 MG-MCG PO TABS: 1.0000 | ORAL_TABLET | Freq: Every day | ORAL | 0 refills | Status: DC

## 2021-02-10 NOTE — Telephone Encounter (Signed)
AEX 02/13/20 Scheduled 02/17/21  Neg mammo 03/18/20

## 2021-02-14 ENCOUNTER — Ambulatory Visit: Payer: 59 | Admitting: Family Medicine

## 2021-02-14 NOTE — Progress Notes (Deleted)
50 y.o. G56P1102 Divorced Black or [place] American Not Hispanic or Latino female here for annual exam.      No LMP recorded.          Sexually active: {yes no:314532}  The current method of family planning is {contraception:315051}.    Exercising: {yes no:314532}  {types:19826} Smoker:  {YES P5382123  Health Maintenance: Pap:  Pap:  05/17/2017 WNL NEG HP;, 05-11-16 WNL NEG Hr HPV; 12-6/16 WNL + HR HPV  History of abnormal Pap:  yes + HPV  MMG:  03/18/20 Bi-rads 1 neg  BMD:   na  Colonoscopy: 05/12/14 normal  TDaP:  05/09/16  Gardasil: n/a   reports that she has never smoked. She has never used smokeless tobacco. She reports that she does not drink alcohol and does not use drugs.  Past Medical History:  Diagnosis Date   Abnormal Pap smear of cervix 04/06/2015   -s/p colpo 03/2015 with gyn with repeat pap with HPV advised in 1 year    Acne    Allergy    SEASONAL   Anemia    menorrhagia   Anxiety    Asthma    mild intermittent   Bronchitis    states gets once a year   Depression    Fracture of fourth metatarsal bone of left foot 04/01/2015   Gallstones    on [place]   Genital warts    GERD (gastroesophageal reflux disease)    GERD (gastroesophageal reflux disease) 09/25/2012   Glucose intolerance (impaired glucose tolerance)    History of echocardiogram    a. 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 Injected 08/24/2015    Pre-diabetes     Past Surgical History:  Procedure Laterality Date   CESAREAN SECTION     cryosurgery cervix     ENDOMETRIAL ABLATION      GYNECOLOGIC CRYOSURGERY     LAPROSCOPIC     After her ablation, she got sick, had a laparoscopy bowel was attached to her uterus. No bowel was removed. No bowel injury. She did have adhesions of her uterus to her abdominal pain.    TUBAL LIGATION      Current Outpatient Medications  Medication Sig Dispense Refill   acarbose (PRECOSE) 50 MG tablet Take 1 tablet (50 mg total) by mouth 3 (three) times daily with meals. 270 tablet 2   ACCU-CHEK FASTCLIX LANCETS MISC 1 each by Does not apply route 2 (two) times daily. Use to monitor glucose levels twice per day; R73.9 102 each 12   betamethasone valerate ointment (VALISONE) 0.1 % Apply 1 application topically 2 (two) times daily. 15 g 0   Blood Glucose Monitoring Suppl (ACCU-CHEK GUIDE ME) w/Device KIT 1 each by Does not apply route 2 (two) times daily. Use to monitor glucose levels twice per day; R73.9 1 kit 0   cetirizine (ZYRTEC) 10 MG tablet Take 1 tablet (10 mg total) by mouth daily. 90 tablet 1   Cholecalciferol (VITAMIN D3 PO) Take 5,000 Units by mouth.     clindamycin (CLEOCIN T)  1 % external solution Apply 1 application topically daily.      clobetasol ointment (TEMOVATE) 0.05 % Apply to affected areas on scalp daily Monday-Friday. Not to face, skin folds.     Dapsone 5 % topical gel Apply topically.     FERROUS SULFATE PO Take by mouth 3 (three) times a week.     fluconazole (DIFLUCAN) 150 MG tablet Take one tablet PO now, may repeat in 72 hours if symptoms have not resolved. 2 tablet 0   glucose blood (ACCU-CHEK GUIDE) test strip Use to monitor glucose levels twice per day; R73.9 100 each 12   hydrocortisone 2.5 % cream APPLY TWICE DAILY FOR 2 WEEKS, THEN DAILY FOR TWO WEEKS, THEN DAILY AS NEEDED     hydrOXYzine (ATARAX/VISTARIL) 10 MG tablet Take 1 tablet (10 mg total) by mouth every 8 (eight) hours as needed for anxiety. (Patient not taking: Reported on 02/13/2020) 20 tablet 0   methotrexate (RHEUMATREX) 2.5 MG tablet Take 7.5 mg by  mouth. Take 4 tablets once a week     Multiple Vitamin (MULTIVITAMIN WITH MINERALS) TABS Take 1 tablet by mouth daily.     norethindrone-ethinyl estradiol-FE (LOESTRIN FE) 1-20 MG-MCG tablet Take 1 tablet by mouth daily. 28 tablet 0   propranolol (INDERAL) 10 MG tablet propranolol 10 mg tablet  take 1 tablet by mouth four times a day if needed     rosuvastatin (CRESTOR) 20 MG tablet TAKE 1 TABLET BY MOUTH EVERY DAY 90 tablet 1   sertraline (ZOLOFT) 100 MG tablet TAKE 2 TABLETS DAILY 60 tablet 1   triamcinolone (NASACORT ALLERGY 24HR) 55 MCG/ACT AERO nasal inhaler Place 2 sprays into the nose daily.     vitamin B-12 (CYANOCOBALAMIN) 1000 MCG tablet Take 1,000 mcg by mouth 3 (three) times a week.      No current facility-administered medications for this visit.    Family History  Problem Relation Age of Onset   Hypertension Mother    Hypertension Father    Hyperlipidemia Father    Dementia Father 87   Cancer Maternal Aunt    Diabetes Brother        pre-diabetes   Healthy Sister    Dementia Maternal Grandmother    Other Maternal Grandfather        hospitalized mental health ?PTSD   Alzheimer's disease Paternal Grandmother    Healthy Sister    Healthy Brother    Heart attack Neg Hx    Heart failure Neg Hx     Review of Systems  Exam:   There were no vitals taken for this visit.  Weight change: '@WEIGHTCHANGE' @ Height:      Ht Readings from Last 3 Encounters:  08/22/19 '5\' 2"'  (1.575 m)  12/05/18 '5\' 2"'  (1.575 m)  06/13/18 5' 2.21" (1.58 m)    General appearance: alert, cooperative and appears stated age Head: Normocephalic, without obvious abnormality, atraumatic Neck: no adenopathy, supple, symmetrical, trachea midline and thyroid {CHL AMB PHY EX THYROID NORM DEFAULT:[phone]::"normal to inspection and palpation"} Lungs: clear to auscultation bilaterally Cardiovascular: regular rate and rhythm Breasts: {Exam; breast:13139::"normal appearance, no masses or  tenderness"} Abdomen: soft, non-tender; non distended,  no masses,  no organomegaly Extremities: extremities normal, atraumatic, no cyanosis or edema Skin: Skin color, texture, turgor normal. No rashes or lesions Lymph nodes: Cervical, supraclavicular, and axillary nodes normal. No abnormal inguinal nodes palpated Neurologic: Grossly normal   Pelvic: External genitalia:  no lesions  Urethra:  normal appearing urethra with no masses, tenderness or lesions              Bartholins and Skenes: normal                 Vagina: normal appearing vagina with normal color and discharge, no lesions              Cervix: {CHL AMB PHY EX CERVIX NORM [phone]::"no lesions"}               Bimanual Exam:  Uterus:  {CHL AMB PHY EX UTERUS NORM DEFAULT:[id number]::"normal size, contour, position, consistency, mobility, non-tender"}              Adnexa: {CHL AMB PHY EX ADNEXA NO MASS DEFAULT:[phone]::"no mass, fullness, tenderness"}               Rectovaginal: Confirms               Anus:  normal sphincter tone, no lesions  *** chaperoned for the exam.  A:  Well Woman with normal exam  P:

## 2021-02-17 ENCOUNTER — Ambulatory Visit: Payer: Self-pay | Admitting: Obstetrics and Gynecology

## 2021-04-21 ENCOUNTER — Telehealth: Payer: 59 | Admitting: Endocrinology

## 2021-04-21 ENCOUNTER — Other Ambulatory Visit: Payer: Self-pay

## 2021-04-21 DIAGNOSIS — R739 Hyperglycemia, unspecified: Secondary | ICD-10-CM

## 2021-04-21 DIAGNOSIS — E559 Vitamin D deficiency, unspecified: Secondary | ICD-10-CM | POA: Insufficient documentation

## 2021-04-21 DIAGNOSIS — E538 Deficiency of other specified B group vitamins: Secondary | ICD-10-CM

## 2021-04-21 DIAGNOSIS — E7800 Pure hypercholesterolemia, unspecified: Secondary | ICD-10-CM

## 2021-04-21 DIAGNOSIS — D649 Anemia, unspecified: Secondary | ICD-10-CM

## 2021-04-21 MED ORDER — ACARBOSE 50 MG PO TABS: 50.0000 mg | ORAL_TABLET | Freq: Three times a day (TID) | ORAL | 2 refills | Status: DC

## 2021-04-21 NOTE — Patient Instructions (Signed)
Blood tests are requested for you today.  We'll let you know about the results.  Please continue the same medications Please come back for a follow-up appointment in 6-12 months

## 2021-04-21 NOTE — Progress Notes (Signed)
Subjective:    Patient ID: Alexis [name], female    DOB: 19-Oct-1970, 51 y.o.   MRN: [id number]  HPI telehealth visit today via video visit.  Alternatives to telehealth are presented to this patient, and the patient agrees to the telehealth visit.   Pt is advised of the cost of the visit, and agrees to this, also.   Patient is at work in [place], [place], and I am at the office.   pt returns for f/u of hyperglycemia with reactive hypoglycemia (dx'ed in 2019, when A1c was 6.0% (she had GDM in 1998); she was rx'ed acarbose).  She tolerates acarbose well.  pt states she feels well in general.  She says cbg's are approx 100.  No recent hypoglycemia.   Past Medical History:  Diagnosis Date   Abnormal Pap smear of cervix 04/06/2015   -s/p colpo 03/2015 with gyn with repeat pap with HPV advised in 1 year    Acne    Allergy    SEASONAL   Anemia    menorrhagia   Anxiety    Asthma    mild intermittent   Bronchitis    states gets once a year   Depression    Fracture of fourth metatarsal bone of left foot 04/01/2015   Gallstones    on [place]   Genital warts    GERD (gastroesophageal reflux disease)    GERD (gastroesophageal reflux disease) 09/25/2012   Glucose intolerance (impaired glucose tolerance)    History of echocardiogram    a. 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 Injected 08/24/2015    Pre-diabetes     Past Surgical History:  Procedure Laterality Date   CESAREAN SECTION     cryosurgery cervix     ENDOMETRIAL ABLATION     GYNECOLOGIC CRYOSURGERY      LAPROSCOPIC     After her ablation, she got sick, had a laparoscopy bowel was attached to her uterus. No bowel was removed. No bowel injury. She did have adhesions of her uterus to her abdominal pain.    TUBAL LIGATION      Social History   Socioeconomic History   Marital status: Divorced    Spouse name: Not on file   Number of children: 2   Years of education: Not on file   Highest education level: Not on file  Occupational History   Occupation: Emergency Services    Comment: [place], [place]  Tobacco Use   Smoking status: Never   Smokeless tobacco: Never  Vaping Use   Vaping Use: Never used  Substance and Sexual Activity   Alcohol use: No    Alcohol/week: 0.0 standard drinks   Drug use: No   Sexual activity: Not Currently    Partners: Male    Birth control/protection: Surgical    Comment: ablation  Other Topics Concern   Not on file  Social History Narrative   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 Use to monitor glucose levels twice per day; R73.9 102 each 12   betamethasone valerate ointment (VALISONE) 0.1 % Apply 1 application topically 2 (two) times daily. 15 g 0   Blood Glucose Monitoring Suppl (ACCU-CHEK GUIDE ME) w/Device KIT 1 each by Does not apply route 2 (two) times daily. Use to monitor glucose levels twice per day; R73.9 1 kit 0   cetirizine (ZYRTEC) 10 MG tablet Take 1 tablet (10 mg total) by mouth daily. 90 tablet 1   Cholecalciferol (VITAMIN D3 PO)  Take 5,000 Units by mouth.     clindamycin (CLEOCIN T) 1 % external solution Apply 1 application topically daily.      clobetasol ointment (TEMOVATE) 0.05 % Apply to affected areas on scalp daily Monday-Friday. Not to face, skin folds.     Dapsone 5 % topical gel Apply topically.     FERROUS SULFATE PO Take by mouth 3 (three) times a week.     fluconazole (DIFLUCAN) 150 MG tablet Take one tablet PO now, may repeat in 72 hours if symptoms have not resolved. 2 tablet 0   glucose blood (ACCU-CHEK GUIDE) test strip Use to monitor glucose levels twice per day; R73.9 100 each 12   hydrocortisone 2.5 % cream APPLY TWICE DAILY FOR 2 WEEKS, THEN DAILY FOR TWO WEEKS, THEN DAILY AS NEEDED     hydrOXYzine (ATARAX/VISTARIL) 10 MG tablet Take 1 tablet (10 mg total) by mouth every 8 (eight) hours as needed for anxiety. 20 tablet 0   methotrexate (RHEUMATREX) 2.5 MG tablet Take 7.5 mg by mouth. Take 4 tablets once a week     Multiple Vitamin (MULTIVITAMIN WITH MINERALS) TABS Take 1 tablet by mouth daily.     norethindrone-ethinyl estradiol-FE (LOESTRIN FE) 1-20 MG-MCG tablet Take 1 tablet by mouth daily. 28 tablet 0   propranolol (INDERAL) 10 MG tablet propranolol 10 mg tablet  take 1 tablet by mouth four times a day if needed     rosuvastatin (CRESTOR) 20 MG tablet TAKE 1 TABLET BY MOUTH EVERY DAY 90 tablet 1   sertraline (ZOLOFT) 100 MG tablet TAKE 2 TABLETS DAILY 60 tablet 1   triamcinolone (NASACORT) 55 MCG/ACT AERO nasal inhaler Place 2 sprays into the nose daily.     vitamin B-12 (CYANOCOBALAMIN) 1000 MCG tablet Take 1,000 mcg by mouth 3 (three) times a week.      No current facility-administered medications on file prior to visit.    No Known Allergies  Family History  Problem Relation Age of Onset   Hypertension Mother    Hypertension Father    Hyperlipidemia Father    Dementia Father 70   Cancer Maternal Aunt    Diabetes Brother        pre-diabetes   Healthy Sister    Dementia Maternal  Grandmother    Other Maternal Grandfather        hospitalized mental health ?PTSD   Alzheimer's disease Paternal Grandmother    Healthy Sister    Healthy Brother    Heart attack Neg  Hx    Heart failure Neg Hx     There were no vitals taken for this visit.   Review of Systems     Objective:   Physical Exam       Assessment & Plan:  Reactive hypoglycemia: well-controlled.  B-12 def: recheck this and other labs.  She will have done at [facility] off [address].   Patient Instructions  Blood tests are requested for you today.  We'll let you know about the results.  Please continue the same medications Please come back for a follow-up appointment in 6-12 months

## 2021-04-22 DIAGNOSIS — R739 Hyperglycemia, unspecified: Secondary | ICD-10-CM | POA: Diagnosis not present

## 2021-04-22 DIAGNOSIS — D649 Anemia, unspecified: Secondary | ICD-10-CM | POA: Diagnosis not present

## 2021-04-22 DIAGNOSIS — E7800 Pure hypercholesterolemia, unspecified: Secondary | ICD-10-CM | POA: Diagnosis not present

## 2021-04-22 DIAGNOSIS — E538 Deficiency of other specified B group vitamins: Secondary | ICD-10-CM | POA: Diagnosis not present

## 2021-04-22 DIAGNOSIS — E559 Vitamin D deficiency, unspecified: Secondary | ICD-10-CM | POA: Diagnosis not present

## 2021-04-23 LAB — CBC WITH DIFFERENTIAL/PLATELET
Basophils Absolute: 0 10*3/uL (ref 0.0–0.2)
Basos: 0 %
EOS (ABSOLUTE): 0.1 10*3/uL (ref 0.0–0.4)
Eos: 1 %
Hematocrit: 36.8 % (ref 34.0–46.6)
Hemoglobin: 11.9 g/dL (ref 11.1–15.9)
Immature Grans (Abs): 0 10*3/uL (ref 0.0–0.1)
Immature Granulocytes: 0 %
Lymphocytes Absolute: 1.2 10*3/uL (ref 0.7–3.1)
Lymphs: 18 %
MCH: 27 pg (ref 26.6–33.0)
MCHC: 32.3 g/dL (ref 31.5–35.7)
MCV: 84 fL (ref 79–97)
Monocytes Absolute: 0.3 10*3/uL (ref 0.1–0.9)
Monocytes: 5 %
Neutrophils Absolute: 5 10*3/uL (ref 1.4–7.0)
Neutrophils: 76 %
Platelets: 324 10*3/uL (ref 150–450)
RBC: 4.4 x10E6/uL (ref 3.77–5.28)
RDW: 14.3 % (ref 11.7–15.4)
WBC: 6.6 10*3/uL (ref 3.4–10.8)

## 2021-04-23 LAB — BASIC METABOLIC PANEL
BUN/Creatinine Ratio: 15 (ref 9–23)
BUN: 13 mg/dL (ref 6–24)
CO2: 26 mmol/L (ref 20–29)
Calcium: 9.6 mg/dL (ref 8.7–10.2)
Chloride: 100 mmol/L (ref 96–106)
Creatinine, Ser: 0.84 mg/dL (ref 0.57–1.00)
Glucose: 98 mg/dL (ref 70–99)
Potassium: 4.4 mmol/L (ref 3.5–5.2)
Sodium: 142 mmol/L (ref 134–144)
eGFR: 85 mL/min/{1.73_m2} (ref >59)

## 2021-04-23 LAB — LIPID PANEL
Chol/HDL Ratio: 2.8 ratio (ref 0.0–4.4)
Cholesterol, Total: 142 mg/dL (ref 100–199)
HDL: 51 mg/dL (ref >39)
LDL Chol Calc (NIH): 75 mg/dL (ref 0–99)
Triglycerides: 81 mg/dL (ref 0–149)
VLDL Cholesterol Cal: 16 mg/dL (ref 5–40)

## 2021-04-23 LAB — VITAMIN B12: Vitamin B-12: 622 pg/mL (ref 232–1245)

## 2021-04-23 LAB — VITAMIN D 25 HYDROXY (VIT D DEFICIENCY, FRACTURES): Vit D, 25-Hydroxy: 80 ng/mL (ref 30.0–100.0)

## 2021-04-23 LAB — HEMOGLOBIN A1C
Est. average glucose Bld gHb Est-mCnc: 120 mg/dL
Hgb A1c MFr Bld: 5.8 % — ABNORMAL HIGH (ref 4.8–5.6)

## 2021-05-10 NOTE — Progress Notes (Signed)
51 y.o. G3P1102 Divorced Black or African American Not Hispanic or Latino female here for annual exam.  Reports continued night sweats.  She started on OCP's in 11/21 for menorrhagia and dysmenorrhea. Well controlled.  H/O endometrial ablation in 2014.  Not currently sexually active since 9/22.  Period Cycle (Days): 28 Period Duration (Days): 3-4 Menstrual Flow: Moderate Menstrual Control: Maxi pad Dysmenorrhea: (!) Moderate Dysmenorrhea Symptoms: Cramping, Headache  Recent HgbA1C was 5.8%, normal lipids  Her 59 year old niece died last weekend, ? Overdose, ? Foul play. She is having trouble sleeping.   Her son is 22, lives in her house (with her other 67 year old son) is struggling with depression. He can't find a job he likes. She travels for work and is gone for months at a time. She is only home for a week every of couple of months. 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   Patient's last menstrual period was 05/01/2021 (approximate).          Sexually active: No.  The current method of family planning is OCP and tubal ligation (estrogen/progesterone).    Exercising: Yes.    Home exercise routine includes Walking. Smoker:  no  Health Maintenance: Pap:  02/13/20 WNL Hr HPV Neg, 05/17/2017 WNL NEG HP; 05-11-16 WNL NEG Hr HPV; 12-6/16 WNL + HR HPV  History of abnormal Pap:  yes 2016  + HPV  MMG: Scheduled for 05/17/21 @ [facility]  BMD:   none  Colonoscopy:05/12/14 normal  TDaP:  05/09/16  Gardasil: n/a   reports that she has never smoked. She has never used smokeless tobacco. She reports that she does not drink alcohol and does not use drugs.She is travelling for work. She is a clinical case Freight forwarder, Education officer, museum  Past Medical History:  Diagnosis Date   Abnormal Pap smear of cervix 04/06/2015   -s/p colpo 03/2015 with gyn with repeat pap with HPV advised in 1 year    Acne    Allergy    SEASONAL   Anemia    menorrhagia   Anxiety    Asthma    mild  intermittent   Bronchitis    states gets once a year   Depression    Fracture of fourth metatarsal bone of left foot 04/01/2015   Gallstones    on [place]   Genital warts    GERD (gastroesophageal reflux disease)    GERD (gastroesophageal reflux disease) 09/25/2012   Glucose intolerance (impaired glucose tolerance)    History of echocardiogram    a. 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 Injected 08/24/2015    Pre-diabetes     Past Surgical History:  Procedure Laterality Date   CESAREAN SECTION     cryosurgery cervix     ENDOMETRIAL ABLATION     GYNECOLOGIC CRYOSURGERY     LAPROSCOPIC     After her ablation, she got sick, had a laparoscopy bowel was attached to her uterus. No bowel was removed. No bowel injury. She did have  adhesions of her uterus to her abdominal pain.    TUBAL LIGATION      Current Outpatient Medications  Medication Sig Dispense Refill   acarbose (PRECOSE) 50 MG tablet Take 1 tablet (50 mg total) by mouth 3 (three) times daily with meals. 270 tablet 2   ACCU-CHEK FASTCLIX LANCETS MISC 1 each by Does not apply route 2 (two) times daily. Use to monitor glucose levels twice per day; R73.9 102 each 12   Blood Glucose Monitoring Suppl (ACCU-CHEK GUIDE ME) w/Device KIT 1 each by Does not apply route 2 (two) times daily. Use to monitor glucose levels twice per day; R73.9 1 kit 0   cetirizine (ZYRTEC) 10 MG tablet Take 1 tablet (10 mg total) by mouth daily. 90 tablet 1   Cholecalciferol (VITAMIN D3 PO) Take 5,000 Units by mouth.     clindamycin (CLEOCIN T) 1 %  external solution Apply 1 application topically daily.      clobetasol ointment (TEMOVATE) 0.05 % Apply to affected areas on scalp daily Monday-Friday. Not to face, skin folds.     FERROUS SULFATE PO Take by mouth 3 (three) times a week.     Fluocinolone Acetonide Body 0.01 % OIL Apply 2-3 times per week at night time on scalp. Do not need to rinse     fluocinonide ointment (LIDEX) 0.05 % Apply to affected skin of scalp twice weekly.     glucose blood (ACCU-CHEK GUIDE) test strip Use to monitor glucose levels twice per day; R73.9 100 each 12   hydrOXYzine (ATARAX/VISTARIL) 10 MG tablet Take 1 tablet (10 mg total) by mouth every 8 (eight) hours as needed for anxiety. 20 tablet 0   methotrexate (RHEUMATREX) 2.5 MG tablet Take 7.5 mg by mouth. Take 4 tablets once a week     Multiple Vitamin (MULTIVITAMIN WITH MINERALS) TABS Take 1 tablet by mouth daily.     norethindrone-ethinyl estradiol-FE (LOESTRIN FE) 1-20 MG-MCG tablet Take 1 tablet by mouth daily. 28 tablet 0   rosuvastatin (CRESTOR) 20 MG tablet TAKE 1 TABLET BY MOUTH EVERY DAY 90 tablet 1   sertraline (ZOLOFT) 100 MG tablet TAKE 2 TABLETS DAILY 60 tablet 1   vitamin B-12 (CYANOCOBALAMIN) 1000 MCG tablet Take 1,000 mcg by mouth 3 (three) times a week.      No current facility-administered medications for this visit.  Methotrexate for alopecia.   Family History  Problem Relation Age of Onset   Hypertension Mother    Hypertension Father    Hyperlipidemia Father    Dementia Father 47   Cancer Maternal Aunt    Diabetes Brother        pre-diabetes   Healthy Sister    Dementia Maternal Grandmother    Other Maternal Grandfather        hospitalized mental health ?PTSD   Alzheimer's disease Paternal Grandmother    Healthy Sister    Healthy Brother    Heart attack Neg Hx    Heart failure Neg Hx     Review of Systems  All other systems reviewed and are negative.  Exam:   BP 108/72    Pulse 98    Ht _0  (1.575 m)    Wt 157 lb (71.2  kg)    LMP 05/01/2021 (Approximate)    SpO2 97%    BMI 28.72 kg/m   Weight change: _1 @ Height:   Height: _2  (157.5 cm)  Ht Readings from Last 3 Encounters:  05/17/21 _3  (1.575 m)  08/22/19  _0  (1.575 m)  12/05/18 _1  (1.575 m)    General appearance: alert, cooperative and appears stated age Head: Normocephalic, without obvious abnormality, atraumatic Neck: no adenopathy, supple, symmetrical, trachea midline and thyroid normal to inspection and palpation Lungs: clear to auscultation bilaterally Cardiovascular: regular rate and rhythm Breasts: normal appearance, no masses or tenderness Abdomen: soft, non-tender; non distended,  no masses,  no organomegaly Extremities: extremities normal, atraumatic, no cyanosis or edema Skin: Skin color, texture, turgor normal. No rashes or lesions Lymph nodes: Cervical, supraclavicular, and axillary nodes normal. No abnormal inguinal nodes palpated Neurologic: Grossly normal   Pelvic: External genitalia:  no lesions              Urethra:  normal appearing urethra with no masses, tenderness or lesions              Bartholins and Skenes: normal                 Vagina: normal appearing vagina with normal color and discharge, no lesions              Cervix: no cervical motion tenderness and no lesions               Bimanual Exam:  Uterus:   anteverted, mobile, normal sized, mildly tender              Adnexa: no mass, fullness, tenderness               Rectovaginal: Confirms               Anus:  normal sphincter tone, no lesions  Gae Dry chaperoned for the exam.  1. Well woman exam Discussed breast self exam Discussed calcium and vit D intake Mammogram is scheduled Colonoscopy is UTD  2. Screening examination for STD (sexually transmitted disease) - HIV Antibody (routine testing w rflx) - RPR - Hepatitis C antibody - SURESWAB CT/NG/T. vaginalis  3. Grief Having trouble sleeping. - LORazepam (ATIVAN) 1 MG tablet;  Take one tablet po qhs prn  Dispense: 30 tablet; Refill: 0. For short term use -Has good support  4. Encounter for surveillance of contraceptive pills Doing well, wants to continue - norethindrone-ethinyl estradiol-FE (LOESTRIN FE) 1-20 MG-MCG tablet; Take 1 tablet by mouth daily.  Dispense: 84 tablet; Refill: 3  5. Severe dysmenorrhea Controlled on OCP's

## 2021-05-17 ENCOUNTER — Ambulatory Visit (INDEPENDENT_AMBULATORY_CARE_PROVIDER_SITE_OTHER): Payer: BC Managed Care – PPO | Admitting: Obstetrics and Gynecology

## 2021-05-17 ENCOUNTER — Encounter: Payer: Self-pay | Admitting: Obstetrics and Gynecology

## 2021-05-17 ENCOUNTER — Other Ambulatory Visit: Payer: Self-pay

## 2021-05-17 VITALS — BP 108/72 | HR 98 | Ht 62.0 in | Wt 157.0 lb

## 2021-05-17 DIAGNOSIS — Z1231 Encounter for screening mammogram for malignant neoplasm of breast: Secondary | ICD-10-CM | POA: Diagnosis not present

## 2021-05-17 DIAGNOSIS — F4321 Adjustment disorder with depressed mood: Secondary | ICD-10-CM

## 2021-05-17 DIAGNOSIS — Z113 Encounter for screening for infections with a predominantly sexual mode of transmission: Secondary | ICD-10-CM

## 2021-05-17 DIAGNOSIS — Z01419 Encounter for gynecological examination (general) (routine) without abnormal findings: Secondary | ICD-10-CM | POA: Diagnosis not present

## 2021-05-17 DIAGNOSIS — Z3041 Encounter for surveillance of contraceptive pills: Secondary | ICD-10-CM

## 2021-05-17 DIAGNOSIS — N946 Dysmenorrhea, unspecified: Secondary | ICD-10-CM

## 2021-05-17 LAB — HM MAMMOGRAPHY

## 2021-05-17 MED ORDER — NORETHIN ACE-ETH ESTRAD-FE 1-20 MG-MCG PO TABS: 1.0000 | ORAL_TABLET | Freq: Every day | ORAL | 3 refills | Status: DC

## 2021-05-17 MED ORDER — LORAZEPAM 1 MG PO TABS: ORAL_TABLET | ORAL | 0 refills | Status: AC

## 2021-05-17 NOTE — Patient Instructions (Signed)
EXERCISE   We recommended that you start or continue a regular exercise program for good health. Physical activity is anything that gets your body moving, some is better than none. The CDC recommends 150 minutes per week of Moderate-Intensity Aerobic Activity and 2 or more days of Muscle Strengthening Activity.  Benefits of exercise are limitless: helps weight loss/weight maintenance, improves mood and energy, helps with depression and anxiety, improves sleep, tones and strengthens muscles, improves balance, improves bone density, protects from chronic conditions such as heart disease, high blood pressure and diabetes and so much more. To learn more visit: https://www.cdc.gov/physicalactivity/index.html  DIET: Good nutrition starts with a healthy diet of fruits, vegetables, whole grains, and lean protein sources. Drink plenty of water for hydration. Minimize empty calories, sodium, sweets. For more information about dietary recommendations visit: https://health.gov/our-work/nutrition-physical-activity/dietary-guidelines and https://www.myplate.gov/  ALCOHOL:  Women should limit their alcohol intake to no more than 7 drinks/beers/glasses of wine (combined, not each!) per week. Moderation of alcohol intake to this level decreases your risk of breast cancer and liver damage.  If you are concerned that you may have a problem, or your friends have told you they are concerned about your drinking, there are many resources to help. A well-known program that is free, effective, and available to all people all over the nation is Alcoholics Anonymous.  Check out this site to learn more: https://www.aa.org/   CALCIUM AND VITAMIN D:  Adequate intake of calcium and Vitamin D are recommended for bone health.  You should be getting between 1000-1200 mg of calcium and 800 units of Vitamin D daily between diet and supplements  PAP SMEARS:  Pap smears, to check for cervical cancer or precancers,  have traditionally been  done yearly, scientific advances have shown that most women can have pap smears less often.  However, every woman still should have a physical exam from her gynecologist every year. It will include a breast check, inspection of the vulva and vagina to check for abnormal growths or skin changes, a visual exam of the cervix, and then an exam to evaluate the size and shape of the uterus and ovaries. We will also provide age appropriate advice regarding health maintenance, like when you should have certain vaccines, screening for sexually transmitted diseases, bone density testing, colonoscopy, mammograms, etc.   MAMMOGRAMS:  All women over 40 years old should have a routine mammogram.   COLON CANCER SCREENING: Now recommend starting at age 45. At this time colonoscopy is not covered for routine screening until 50. There are take home tests that can be done between 45-49.   COLONOSCOPY:  Colonoscopy to screen for colon cancer is recommended for all women at age 50.  We know, you hate the idea of the prep.  We agree, BUT, having colon cancer and not knowing it is worse!!  Colon cancer so often starts as a polyp that can be seen and removed at colonscopy, which can quite literally save your life!  And if your first colonoscopy is normal and you have no family history of colon cancer, most women don't have to have it again for 10 years.  Once every ten years, you can do something that may end up saving your life, right?  We will be happy to help you get it scheduled when you are ready.  Be sure to check your insurance coverage so you understand how much it will cost.  It may be covered as a preventative service at no cost, but you should check   your particular policy.      Breast Self-Awareness Breast self-awareness means being familiar with how your breasts look and feel. It involves checking your breasts regularly and reporting any changes to your health care provider. Practicing breast self-awareness is  important. A change in your breasts can be a sign of a serious medical problem. Being familiar with how your breasts look and feel allows you to find any problems early, when treatment is more likely to be successful. All women should practice breast self-awareness, including women who have had breast implants. How to do a breast self-exam One way to learn what is normal for your breasts and whether your breasts are changing is to do a breast self-exam. To do a breast self-exam: Look for Changes  Remove all the clothing above your waist. Stand in front of a mirror in a room with good lighting. Put your hands on your hips. Push your hands firmly downward. Compare your breasts in the mirror. Look for differences between them (asymmetry), such as: Differences in shape. Differences in size. Puckers, dips, and bumps in one breast and not the other. Look at each breast for changes in your skin, such as: Redness. Scaly areas. Look for changes in your nipples, such as: Discharge. Bleeding. Dimpling. Redness. A change in position. Feel for Changes Carefully feel your breasts for lumps and changes. It is best to do this while lying on your back on the floor and again while sitting or standing in the shower or tub with soapy water on your skin. Feel each breast in the following way: Place the arm on the side of the breast you are examining above your head. Feel your breast with the other hand. Start in the nipple area and make  inch (2 cm) overlapping circles to feel your breast. Use the pads of your three middle fingers to do this. Apply light pressure, then medium pressure, then firm pressure. The light pressure will allow you to feel the tissue closest to the skin. The medium pressure will allow you to feel the tissue that is a little deeper. The firm pressure will allow you to feel the tissue close to the ribs. Continue the overlapping circles, moving downward over the breast until you feel your  ribs below your breast. Move one finger-width toward the center of the body. Continue to use the  inch (2 cm) overlapping circles to feel your breast as you move slowly up toward your collarbone. Continue the up and down exam using all three pressures until you reach your armpit.  Write Down What You Find  Write down what is normal for each breast and any changes that you find. Keep a written record with breast changes or normal findings for each breast. By writing this information down, you do not need to depend only on memory for size, tenderness, or location. Write down where you are in your menstrual cycle, if you are still menstruating. If you are having trouble noticing differences in your breasts, do not get discouraged. With time you will become more familiar with the variations in your breasts and more comfortable with the exam. How often should I examine my breasts? Examine your breasts every month. If you are breastfeeding, the best time to examine your breasts is after a feeding or after using a breast pump. If you menstruate, the best time to examine your breasts is 5-7 days after your period is over. During your period, your breasts are lumpier, and it may be more   difficult to notice changes. When should I see my health care provider? See your health care provider if you notice: A change in shape or size of your breasts or nipples. A change in the skin of your breast or nipples, such as a reddened or scaly area. Unusual discharge from your nipples. A lump or thick area that was not there before. Pain in your breasts. Anything that concerns you. Managing Loss, Adult People experience loss in many different ways throughout their lives. Events such as moving, changing jobs, and losing friends can create a sense of loss. The loss may be as serious as a major health change, divorce, death of a pet, or death of a loved one. All of these types of loss are likely to create a physical and  emotional reaction known as grief. Grief is the result of a major change or an absence of something or someone that you count on. Grief is a normal reaction to loss. A variety of factors can affect your grieving experience, including: The nature of your loss. Your relationship to what or whom you lost. Your understanding of grief and how to manage it. Your support system. Be aware that when grief becomes extreme, it can lead to more severe issues like isolation, depression, anxiety, or suicidal thoughts. Talk with your health care provider if you have any of these issues. How to manage lifestyle changes Keep to your normal routine as much as possible. If you have trouble focusing or doing normal activities, it is acceptable to take some time away from your normal routine. Spend time with friends and loved ones. Eat a healthy diet, get plenty of sleep, and rest when you feel tired. How to recognize changes  The way that you deal with your grief will affect your ability to function as you normally do. When grieving, you may experience these changes: Numbness, shock, sadness, anxiety, anger, denial, and guilt. Thoughts about death. Unexpected crying. A physical sensation of emptiness in your stomach. Problems sleeping and eating. Tiredness (fatigue). Loss of interest in normal activities. Dreaming about or imagining seeing the person who died. A need to remember what or whom you lost. Difficulty thinking about anything other than your loss for a period of time. Relief. If you have been expecting the loss for a while, you may feel a sense of relief when it happens. Follow these instructions at home: Activity Express your feelings in healthy ways, such as: Talking with others about your loss. It may be helpful to find others who have had a similar loss, such as a support group. Writing down your feelings in a journal. Doing physical activities to release stress and emotional energy. Doing  creative activities like painting, sculpting, or playing or listening to music. Practicing resilience. This is the ability to recover and adjust after facing challenges. Reading some resources that encourage resilience may help you to learn ways to practice those behaviors.  General instructions Be patient with yourself and others. Allow the grieving process to happen, and remember that grieving takes time. It is likely that you may never feel completely done with some grief. You may find a way to move on while still cherishing memories and feelings about your loss. Accepting your loss is a process. It can take months or longer to adjust. Keep all follow-up visits. This is important. Where to find support To get support for managing loss: Ask your health care provider for help and recommendations, such as grief counseling or therapy. Think  about joining a support group for people who are managing a loss. Where to find more information You can find more information about managing loss from: American Society of Clinical Oncology: www.cancer.net American Psychological Association: TVStereos.ch Contact a health care provider if: Your grief is extreme and keeps getting worse. You have ongoing grief that does not improve. Your body shows symptoms of grief, such as illness. You feel depressed, anxious, or hopeless. Get help right away if: You have thoughts about hurting yourself or others. Get help right away if you feel like you may hurt yourself or others, or have thoughts about taking your own life. Go to your nearest emergency room or: Call 911. Call the [facility] at [phone] or 988. This is open 24 hours a day. Text the Crisis Text Line at 928 416 5653. Summary Grief is the result of a major change or an absence of someone or something that you count on. Grief is a normal reaction to loss. The depth of grief and the period of recovery depend on the type of loss  and your ability to adjust to the change and process your feelings. Processing grief requires patience and a willingness to accept your feelings and talk about your loss with people who are supportive. It is important to find resources that work for you and to realize that people experience grief differently. There is not one grieving process that works for everyone in the same way. Be aware that when grief becomes extreme, it can lead to more severe issues like isolation, depression, anxiety, or suicidal thoughts. Talk with your health care provider if you have any of these issues. This information is not intended to replace advice given to you by your health care provider. Make sure you discuss any questions you have with your health care provider. Document Revised: 11/08/2020 Document Reviewed: 11/08/2020 Elsevier Patient Education  [employer].

## 2021-05-18 LAB — SURESWAB CT/NG/T. VAGINALIS
C. trachomatis RNA, TMA: NOT DETECTED
N. gonorrhoeae RNA, TMA: NOT DETECTED
Trichomonas vaginalis RNA: NOT DETECTED

## 2021-05-18 LAB — RPR: RPR Ser Ql: NONREACTIVE

## 2021-05-18 LAB — HEPATITIS C ANTIBODY
Hepatitis C Ab: NONREACTIVE
SIGNAL TO CUT-OFF: <0.02 (ref <1.00)

## 2021-05-18 LAB — HIV ANTIBODY (ROUTINE TESTING W REFLEX): HIV 1&2 Ab, 4th Generation: NONREACTIVE

## 2021-05-19 ENCOUNTER — Encounter: Payer: Self-pay | Admitting: Obstetrics and Gynecology

## 2021-05-20 ENCOUNTER — Encounter: Payer: Self-pay | Admitting: Family Medicine

## 2021-05-20 ENCOUNTER — Telehealth (INDEPENDENT_AMBULATORY_CARE_PROVIDER_SITE_OTHER): Payer: BC Managed Care – PPO | Admitting: Family Medicine

## 2021-05-20 VITALS — Wt 157.0 lb

## 2021-05-20 DIAGNOSIS — R739 Hyperglycemia, unspecified: Secondary | ICD-10-CM | POA: Diagnosis not present

## 2021-05-20 DIAGNOSIS — E7800 Pure hypercholesterolemia, unspecified: Secondary | ICD-10-CM | POA: Diagnosis not present

## 2021-05-20 DIAGNOSIS — K581 Irritable bowel syndrome with constipation: Secondary | ICD-10-CM

## 2021-05-20 DIAGNOSIS — J302 Other seasonal allergic rhinitis: Secondary | ICD-10-CM

## 2021-05-20 DIAGNOSIS — F419 Anxiety disorder, unspecified: Secondary | ICD-10-CM

## 2021-05-20 MED ORDER — ROSUVASTATIN CALCIUM 20 MG PO TABS
20.0000 mg | ORAL_TABLET | Freq: Every day | ORAL | 1 refills | Status: DC
Start: 2021-05-20 — End: 2022-01-19

## 2021-05-20 MED ORDER — SERTRALINE HCL 100 MG PO TABS: ORAL_TABLET | ORAL | 1 refills | Status: DC

## 2021-05-20 NOTE — Progress Notes (Signed)
Virtual Visit via Video Note  I connected with Alexis [name]  on 05/20/21 at  2:30 PM EST by a video enabled telemedicine application and verified that I am speaking with the correct person using two identifiers.  Location patient: home Location provider: [place], [place] [address] Persons participating in the virtual visit: patient, provider  I discussed the limitations of evaluation and management by telemedicine and the availability of in person appointments. The patient expressed understanding and agreed to proceed.   [name] DOB: 1970/05/13 Encounter date: 05/20/2021  This is a 51 y.o. female who presents with Chief Complaint  Patient presents with   Medication Refill    History of present illness: She is heading to her niece's [place] now.   She travels for work now, so that has limited her ability to schedule appointments. [place] work.  Did have visit with Dr. Loanne Drilling; had labs done. A1C was a little elevated, but still in prediabetic stage. Walking and eating healthy.   Stress levels are better.   Seasonal allergies - zyrtec daily. Increased drainage in [place] when there. Benadryl at night helping.  Follows with Dr.jertson for gyn care.bleeding is getting heavier; they discussed at last visit. Continue with birth control.   Hasn't had palpitations.   IBS still issue. Uses herbal supplement which helps her to go regularly. Goes daily with this.   Did reach out to dermatology for hair loss.  Needs refills on crestor and zoloft.   She tried to decrease the zoloft in the past but this didn't go well. Doesn't want to decrease again.   No Known Allergies Current Meds  Medication Sig   acarbose (PRECOSE) 50 MG tablet Take 1 tablet (50 mg total) by mouth 3 (three) times daily with meals.   ACCU-CHEK FASTCLIX LANCETS MISC 1 each by Does not apply route 2 (two) times daily. Use to monitor glucose levels twice per day;  R73.9   Blood Glucose Monitoring Suppl (ACCU-CHEK GUIDE ME) w/Device KIT 1 each by Does not apply route 2 (two) times daily. Use to monitor glucose levels twice per day; R73.9   cetirizine (ZYRTEC) 10 MG tablet Take 1 tablet (10 mg total) by mouth daily.   Cholecalciferol (VITAMIN D3 PO) Take 3,000 Units by mouth.   clindamycin (CLEOCIN T) 1 % external solution Apply 1 application topically daily.    clobetasol ointment (TEMOVATE) 0.05 % Apply to affected areas on scalp daily Monday-Friday. Not to face, skin folds.   FERROUS SULFATE PO Take by mouth 3 (three) times a week.   Fluocinolone Acetonide Body 0.01 % OIL Apply 2-3 times per week at night time on scalp. Do not need to rinse   fluocinonide ointment (LIDEX) 0.05 % Apply to affected skin of scalp twice weekly.   glucose blood (ACCU-CHEK GUIDE) test strip Use to monitor glucose levels twice per day; R73.9   hydrOXYzine (ATARAX/VISTARIL) 10 MG tablet Take 1 tablet (10 mg total) by mouth every 8 (eight) hours as needed for anxiety.   LORazepam (ATIVAN) 1 MG tablet Take one tablet po qhs prn   methotrexate (RHEUMATREX) 2.5 MG tablet Take 7.5 mg by mouth. Take 4 tablets once a week   Multiple Vitamin (MULTIVITAMIN WITH MINERALS) TABS Take 1 tablet by mouth daily.   norethindrone-ethinyl estradiol-FE (LOESTRIN FE) 1-20 MG-MCG tablet Take 1 tablet by mouth daily.   rosuvastatin (CRESTOR) 20 MG tablet TAKE 1 TABLET BY MOUTH EVERY DAY   sertraline (ZOLOFT) 100  MG tablet TAKE 2 TABLETS DAILY   vitamin B-12 (CYANOCOBALAMIN) 1000 MCG tablet Take 1,000 mcg by mouth 3 (three) times a week.     Review of Systems  Constitutional:  Negative for chills, fatigue and fever.  Respiratory:  Negative for cough, chest tightness, shortness of breath and wheezing.   Cardiovascular:  Negative for chest pain, palpitations and leg swelling.  Psychiatric/Behavioral:  Negative for sleep disturbance. The patient is not nervous/anxious.    Objective:  Wt 157 lb  (71.2 kg)    LMP 05/01/2021 (Approximate)    BMI 28.72 kg/m   Weight: 157 lb (71.2 kg)   BP Readings from Last 3 Encounters:  05/17/21 108/72  02/13/20 120/78  08/22/19 102/68   Wt Readings from Last 3 Encounters:  05/20/21 157 lb (71.2 kg)  05/17/21 157 lb (71.2 kg)  02/13/20 157 lb (71.2 kg)    EXAM:  GENERAL: alert, oriented, appears well and in no acute distress  HEENT: atraumatic, conjunctiva clear, no obvious abnormalities on inspection of external nose and ears  NECK: normal movements of the head and neck  LUNGS: on inspection no signs of respiratory distress, breathing rate appears normal, no obvious gross SOB, gasping or wheezing  CV: no obvious cyanosis  MS: moves all visible extremities without noticeable abnormality  PSYCH/NEURO: pleasant and cooperative, no obvious depression or anxiety, speech and thought processing grossly intact  Assessment/Plan  1. Anxiety Continue with current zoloftdose.   2. Hyperglycemia Well controlled. Last A1C 1/20 was 5.8. controlled with diet/lifestyle.  3. Seasonal allergies Stable with Zyrtec.  When she is working out in [place] she needs Benadryl to help at bedtime sometimes.  Seems to do better here with allergies.  She will only be on [place] for short while longer.  4. Pure hypercholesterolemia She is on Crestor 20 mg daily.  Tolerates this well.  5. Irritable bowel syndrome with constipation Bowels been stable.    I discussed the assessment and treatment plan with the patient. The patient was provided an opportunity to ask questions and all were answered. The patient agreed with the plan and demonstrated an understanding of the instructions.   The patient was advised to call back or seek an in-person evaluation if the symptoms worsen or if the condition fails to improve as anticipated.  I provided 25 minutes of face-to-face time during this encounter.   Alexis Rough, MD

## 2021-06-15 ENCOUNTER — Telehealth: Payer: Self-pay | Admitting: Family Medicine

## 2021-06-15 NOTE — Telephone Encounter (Signed)
Pt is calling and per pt she is in [place] and md is aware and pt discuss her symptoms with md on 05-20-2021. Pt is having tickle in throat with drainage and has tried sudafed, mucinex, nasal spray and humidifier with  no relief and would like rx [facility] to [facility] [address] [place] [address] phone number [phone] ? ?

## 2021-06-15 NOTE — Telephone Encounter (Signed)
Spoke with the patient and informed her medications are not sent in without a visit.  Also informed her unfortunately, we cannot schedule a virtual visit for evaluation as [place] and advised she seek treatment at a local urgent care.   ?

## 2021-08-04 ENCOUNTER — Telehealth: Payer: Self-pay | Admitting: Family Medicine

## 2021-08-04 NOTE — Telephone Encounter (Signed)
Pt is calling and would like to know if its ok for her to get MMR if she has had one already. Pt is in [place]  ?

## 2021-08-10 NOTE — Telephone Encounter (Signed)
Patient informed of the message below.

## 2021-08-10 NOTE — Telephone Encounter (Signed)
You can get it again if your body is not showing that you are fully immune to the components in the vaccine. We can test titers to see if it is necessary before getting it again if desired. ?

## 2021-12-13 ENCOUNTER — Other Ambulatory Visit: Payer: Self-pay | Admitting: *Deleted

## 2021-12-14 MED ORDER — SERTRALINE HCL 100 MG PO TABS: ORAL_TABLET | ORAL | 0 refills | Status: DC

## 2021-12-14 NOTE — Telephone Encounter (Signed)
Needs a TOC appt

## 2022-01-11 ENCOUNTER — Other Ambulatory Visit: Payer: Self-pay | Admitting: Family Medicine

## 2022-01-19 ENCOUNTER — Other Ambulatory Visit: Payer: Self-pay | Admitting: *Deleted

## 2022-01-19 MED ORDER — ROSUVASTATIN CALCIUM 20 MG PO TABS: 20.0000 mg | ORAL_TABLET | Freq: Every day | ORAL | 0 refills | Status: DC

## 2022-01-19 NOTE — Telephone Encounter (Signed)
Ok to fill but she will need an appt for [facility]

## 2022-01-20 ENCOUNTER — Other Ambulatory Visit: Payer: Self-pay

## 2022-01-26 ENCOUNTER — Other Ambulatory Visit: Payer: Self-pay

## 2022-01-26 DIAGNOSIS — R739 Hyperglycemia, unspecified: Secondary | ICD-10-CM

## 2022-01-26 MED ORDER — ACARBOSE 50 MG PO TABS: 50.0000 mg | ORAL_TABLET | Freq: Three times a day (TID) | ORAL | 0 refills | Status: AC

## 2022-03-13 ENCOUNTER — Other Ambulatory Visit: Payer: Self-pay | Admitting: Family Medicine

## 2022-03-15 ENCOUNTER — Other Ambulatory Visit: Payer: Self-pay | Admitting: Family Medicine

## 2022-04-05 ENCOUNTER — Other Ambulatory Visit: Payer: Self-pay | Admitting: Family Medicine

## 2022-04-12 ENCOUNTER — Other Ambulatory Visit: Payer: Self-pay | Admitting: Obstetrics and Gynecology

## 2022-04-12 DIAGNOSIS — Z3041 Encounter for surveillance of contraceptive pills: Secondary | ICD-10-CM

## 2022-04-12 NOTE — Telephone Encounter (Signed)
Last AEX 05/17/2021--nothing scheduled. Recall placed. Last mammo 05/17/2021- neg birads 1  Per pharmacy: Last refill picked up on 01/17/2022

## 2022-04-24 ENCOUNTER — Other Ambulatory Visit: Payer: Self-pay | Admitting: Family Medicine

## 2022-04-25 NOTE — Telephone Encounter (Signed)
Patient hasn't been seen since 05/2021 and it was a video visit-- I will give 1 more month but she needs to come in for a TOC visit.

## 2022-05-19 ENCOUNTER — Other Ambulatory Visit: Payer: Self-pay | Admitting: Family Medicine

## 2022-06-21 ENCOUNTER — Other Ambulatory Visit: Payer: Self-pay | Admitting: Family Medicine

## 2022-06-21 NOTE — Telephone Encounter (Signed)
Pt has not been seen in >1 year, needs appointmetn, please shcedule her with me. Thanks!

## 2022-06-25 ENCOUNTER — Other Ambulatory Visit: Payer: Self-pay | Admitting: Family Medicine

## 2022-06-27 ENCOUNTER — Other Ambulatory Visit: Payer: Self-pay | Admitting: Family Medicine

## 2022-07-09 ENCOUNTER — Other Ambulatory Visit: Payer: Self-pay | Admitting: Obstetrics and Gynecology

## 2022-07-09 DIAGNOSIS — Z3041 Encounter for surveillance of contraceptive pills: Secondary | ICD-10-CM

## 2023-02-27 ENCOUNTER — Telehealth: Payer: Self-pay | Admitting: *Deleted

## 2023-02-27 NOTE — Telephone Encounter (Signed)
   Pre-operative Risk Assessment    Patient Name: Alexis [name]  DOB: 1970-04-04 MRN: [id number]  DATE OF LAST VISIT: 07/06/15 DR. NASHER DATE OF NEXT VISIT: NONE-PT WILL NEED NEW PT APPT.     Request for Surgical Clearance    Procedure:   COLONOSCOPY  Date of Surgery:  Clearance 03/20/23                                 Surgeon:  DR. Leavy Cella Surgeon's Group or Practice Name:  [facility] Phone number:  [phone] Fax number:  [phone]   Type of Clearance Requested:   - Medical ; NONE INDICATED TO BE HELD   Type of Anesthesia:   PROPOFOL   Additional requests/questions:    Elpidio Anis   02/27/2023, 3:07 PM

## 2023-03-05 NOTE — Telephone Encounter (Signed)
I will update the requesting office the pt needs a new pt. Our scheduling team will help reach out to the pt with a new pt appt.

## 2023-03-05 NOTE — Telephone Encounter (Signed)
Alexis [name], with our scheduling team: left pt leaving her a VM to callback and schedule with [place]!

## 2023-03-05 NOTE — Telephone Encounter (Signed)
Have sent a message to our scheduling team to see if they may be able to help find a new pt appt.

## 2023-03-12 NOTE — Telephone Encounter (Signed)
I will send FYI to surgeon's office we have been trying to reach pt to schedule a NEW PT APPT FOR PRE OP CLEARANCE.
